# Patient Record
Sex: Female | Born: 1950 | Race: White | Hispanic: No | Marital: Married | Smoking: Never smoker
Health system: Southern US, Community
[De-identification: ages and names within clinical notes are randomized; demographics above are authoritative.]

## PROBLEM LIST (undated history)

## (undated) DIAGNOSIS — L680 Hirsutism: Secondary | ICD-10-CM

## (undated) DIAGNOSIS — E282 Polycystic ovarian syndrome: Secondary | ICD-10-CM

## (undated) DIAGNOSIS — E281 Androgen excess: Secondary | ICD-10-CM

## (undated) DIAGNOSIS — I10 Essential (primary) hypertension: Secondary | ICD-10-CM

## (undated) DIAGNOSIS — K219 Gastro-esophageal reflux disease without esophagitis: Secondary | ICD-10-CM

## (undated) DIAGNOSIS — E785 Hyperlipidemia, unspecified: Secondary | ICD-10-CM

## (undated) DIAGNOSIS — L658 Other specified nonscarring hair loss: Secondary | ICD-10-CM

## (undated) DIAGNOSIS — M8580 Other specified disorders of bone density and structure, unspecified site: Secondary | ICD-10-CM

## (undated) DIAGNOSIS — A498 Other bacterial infections of unspecified site: Secondary | ICD-10-CM

## (undated) DIAGNOSIS — M1990 Unspecified osteoarthritis, unspecified site: Secondary | ICD-10-CM

## (undated) DIAGNOSIS — E039 Hypothyroidism, unspecified: Secondary | ICD-10-CM

## (undated) DIAGNOSIS — T7840XA Allergy, unspecified, initial encounter: Secondary | ICD-10-CM

## (undated) DIAGNOSIS — E079 Disorder of thyroid, unspecified: Secondary | ICD-10-CM

## (undated) HISTORY — PX: POLYPECTOMY: SHX149

## (undated) HISTORY — DX: Other bacterial infections of unspecified site: A49.8

## (undated) HISTORY — DX: Hirsutism: L68.0

## (undated) HISTORY — DX: Hypothyroidism, unspecified: E03.9

## (undated) HISTORY — PX: WISDOM TOOTH EXTRACTION: SHX21

## (undated) HISTORY — DX: Polycystic ovarian syndrome: E28.2

## (undated) HISTORY — DX: Unspecified osteoarthritis, unspecified site: M19.90

## (undated) HISTORY — DX: Allergy, unspecified, initial encounter: T78.40XA

## (undated) HISTORY — DX: Gastro-esophageal reflux disease without esophagitis: K21.9

## (undated) HISTORY — DX: Hyperlipidemia, unspecified: E78.5

## (undated) HISTORY — PX: TONSILLECTOMY: SUR1361

## (undated) HISTORY — DX: Essential (primary) hypertension: I10

## (undated) HISTORY — DX: Other specified disorders of bone density and structure, unspecified site: M85.80

## (undated) HISTORY — DX: Other specified nonscarring hair loss: L65.8

## (undated) HISTORY — DX: Androgen excess: E28.1

---

## 2000-04-08 ENCOUNTER — Other Ambulatory Visit: Admission: RE | Admit: 2000-04-08 | Discharge: 2000-04-08 | Payer: Self-pay | Admitting: Obstetrics & Gynecology

## 2003-09-21 ENCOUNTER — Other Ambulatory Visit: Admission: RE | Admit: 2003-09-21 | Discharge: 2003-09-21 | Payer: Self-pay | Admitting: Gynecology

## 2009-10-28 HISTORY — PX: COLONOSCOPY: SHX174

## 2011-10-29 DIAGNOSIS — A498 Other bacterial infections of unspecified site: Secondary | ICD-10-CM

## 2011-10-29 HISTORY — DX: Other bacterial infections of unspecified site: A49.8

## 2012-08-17 ENCOUNTER — Other Ambulatory Visit: Payer: Self-pay | Admitting: Gastroenterology

## 2012-08-17 DIAGNOSIS — R109 Unspecified abdominal pain: Secondary | ICD-10-CM

## 2012-08-20 ENCOUNTER — Ambulatory Visit
Admission: RE | Admit: 2012-08-20 | Discharge: 2012-08-20 | Disposition: A | Discharge status: Home / Self Care | Payer: BC Managed Care – PPO | Source: Ambulatory Visit | Attending: Gastroenterology | Admitting: Gastroenterology

## 2012-08-20 DIAGNOSIS — R109 Unspecified abdominal pain: Secondary | ICD-10-CM

## 2012-08-20 MED ORDER — IOHEXOL 300 MG/ML  SOLN
100.0000 mL | Freq: Once | INTRAMUSCULAR | Status: AC | PRN
  Administered 2012-08-20: 100 mL via INTRAVENOUS

## 2012-12-08 ENCOUNTER — Encounter: Payer: Self-pay | Admitting: Family Medicine

## 2013-05-10 ENCOUNTER — Other Ambulatory Visit: Payer: Self-pay | Admitting: Obstetrics and Gynecology

## 2013-05-10 DIAGNOSIS — R7989 Other specified abnormal findings of blood chemistry: Secondary | ICD-10-CM

## 2013-05-12 LAB — HM DEXA SCAN

## 2013-05-18 ENCOUNTER — Ambulatory Visit
Admission: RE | Admit: 2013-05-18 | Discharge: 2013-05-18 | Disposition: A | Discharge status: Home / Self Care | Payer: BC Managed Care – PPO | Source: Ambulatory Visit | Attending: Obstetrics and Gynecology | Admitting: Obstetrics and Gynecology

## 2013-05-18 ENCOUNTER — Other Ambulatory Visit: Payer: BC Managed Care – PPO

## 2013-05-18 DIAGNOSIS — R7989 Other specified abnormal findings of blood chemistry: Secondary | ICD-10-CM

## 2013-05-18 MED ORDER — IOHEXOL 300 MG/ML  SOLN
100.0000 mL | Freq: Once | INTRAMUSCULAR | Status: AC | PRN
  Administered 2013-05-18: 100 mL via INTRAVENOUS

## 2013-06-10 LAB — HEMOGLOBIN A1C: Hgb A1c MFr Bld: 5.6 % (ref 4.0–6.0)

## 2013-10-28 HISTORY — PX: COLONOSCOPY: SHX174

## 2014-01-11 LAB — HEMOGLOBIN A1C: Hgb A1c MFr Bld: 5.7 % (ref 4.0–6.0)

## 2014-02-25 LAB — HEPATIC FUNCTION PANEL
ALT: 28 U/L (ref 7–35)
AST: 24 U/L (ref 13–35)

## 2014-02-25 LAB — BASIC METABOLIC PANEL
BUN: 22 mg/dL — AB (ref 4–21)
Creatinine: 1 mg/dL (ref 0.5–1.1)
Glucose: 111 mg/dL
POTASSIUM: 5.4 mmol/L — AB (ref 3.4–5.3)
SODIUM: 137 mmol/L (ref 137–147)

## 2014-02-25 LAB — TSH: TSH: 3.17 u[IU]/mL (ref 0.41–5.90)

## 2014-03-18 LAB — BASIC METABOLIC PANEL
BUN: 26 mg/dL — AB (ref 4–21)
Creatinine: 1.3 mg/dL — AB (ref 0.5–1.1)
GLUCOSE: 92 mg/dL
Potassium: 5.1 mmol/L (ref 3.4–5.3)
SODIUM: 139 mmol/L (ref 137–147)

## 2014-03-18 LAB — HEPATIC FUNCTION PANEL
ALT: 33 U/L (ref 7–35)
AST: 24 U/L (ref 13–35)
Alkaline Phosphatase: 55 U/L (ref 25–125)
BILIRUBIN, TOTAL: 0.5 mg/dL

## 2014-05-11 ENCOUNTER — Encounter: Payer: Self-pay | Admitting: *Deleted

## 2014-07-06 ENCOUNTER — Ambulatory Visit (INDEPENDENT_AMBULATORY_CARE_PROVIDER_SITE_OTHER): Payer: BC Managed Care – PPO | Admitting: Endocrinology

## 2014-07-06 ENCOUNTER — Other Ambulatory Visit: Payer: Self-pay | Admitting: Endocrinology

## 2014-07-06 ENCOUNTER — Encounter: Payer: Self-pay | Admitting: Endocrinology

## 2014-07-06 VITALS — BP 126/70 | HR 63 | Temp 98.3°F | Resp 16 | Wt 187.4 lb

## 2014-07-06 DIAGNOSIS — E039 Hypothyroidism, unspecified: Secondary | ICD-10-CM

## 2014-07-06 DIAGNOSIS — E288 Other ovarian dysfunction: Secondary | ICD-10-CM

## 2014-07-06 DIAGNOSIS — E281 Androgen excess: Secondary | ICD-10-CM

## 2014-07-06 DIAGNOSIS — E282 Polycystic ovarian syndrome: Secondary | ICD-10-CM | POA: Insufficient documentation

## 2014-07-06 DIAGNOSIS — M8580 Other specified disorders of bone density and structure, unspecified site: Secondary | ICD-10-CM | POA: Insufficient documentation

## 2014-07-06 DIAGNOSIS — I10 Essential (primary) hypertension: Secondary | ICD-10-CM | POA: Insufficient documentation

## 2014-07-06 LAB — COMPREHENSIVE METABOLIC PANEL
ALT: 32 U/L (ref 0–35)
AST: 24 U/L (ref 0–37)
Albumin: 4.4 g/dL (ref 3.5–5.2)
Alkaline Phosphatase: 53 U/L (ref 39–117)
BUN: 13 mg/dL (ref 6–23)
CO2: 27 meq/L (ref 19–32)
CREATININE: 1 mg/dL (ref 0.4–1.2)
Calcium: 10 mg/dL (ref 8.4–10.5)
Chloride: 103 mEq/L (ref 96–112)
GFR: 57.28 mL/min — AB (ref >60.00)
Glucose, Bld: 93 mg/dL (ref 70–99)
Potassium: 5.5 mEq/L — ABNORMAL HIGH (ref 3.5–5.1)
Sodium: 139 mEq/L (ref 135–145)
Total Bilirubin: 0.5 mg/dL (ref 0.2–1.2)
Total Protein: 7.5 g/dL (ref 6.0–8.3)

## 2014-07-06 LAB — TSH: TSH: 0.91 u[IU]/mL (ref 0.35–4.50)

## 2014-07-06 LAB — T4, FREE: Free T4: 0.98 ng/dL (ref 0.60–1.60)

## 2014-07-06 LAB — HEMOGLOBIN A1C: Hgb A1c MFr Bld: 6.2 % (ref 4.6–6.5)

## 2014-07-06 MED ORDER — SPIRONOLACTONE 50 MG PO TABS: 75.0000 mg | ORAL_TABLET | Freq: Every day | ORAL | Status: DC

## 2014-07-06 MED ORDER — SPIRONOLACTONE 50 MG PO TABS: 50.0000 mg | ORAL_TABLET | Freq: Every day | ORAL | Status: DC

## 2014-07-06 NOTE — Assessment & Plan Note (Signed)
Last Testosterone levels were improving on Spironolactone treatment. Encouraged diet and exercise in an effort to lose weight.  Recheck levels at this visit. Spironolactone controlling her hirsuitism well. She has tried vaniqua in the past, however it has been too expensive.

## 2014-07-06 NOTE — Progress Notes (Signed)
Pre visit review using our clinic review tool, if applicable. No additional management support is needed unless otherwise documented below in the visit note. 

## 2014-07-06 NOTE — Progress Notes (Signed)
Reason for visit: Hypothyroidism, PCOS, hyperandrogenemia  HPI  Cynthia Roberson is a 63 y.o.-year-old female,  is here for  Follow up for hypothyroidism, elevated Testosterone levels and postmenopausal PCOS. Dr Ester Rink is her dermatologist at [facility]. PCP is Dr Leighton Ruff.   Hypothyroidism- Patient recalls being diagnosed with hypothyroidism in 2002 following blood work. She takes Synthroid 50 mcg daily, and her dose has not changed recently. The patient is taking her medication as directed and has not missed any doses.  Patient has been taking this medication fasting, with water, separate from breakfast > 30 minutes and from pills ( calcium, iron, PPIs, multivitamins)> 4 hours.   I reviewed pt's thyroid tests: Lab Results  Component Value Date   TSH 3.17 02/25/2014    Review of systems: [ x  ] complains of    [  ] denies [  x] weight gain, attributes this due to her lifestyle  [  ] constipation  [  ] fatigue   [  ] dry skin  [  ] cold intolerance [  ] hair loss [  ] noticing any enlargement in size of thyroid [  ] lumps in neck [  ] dysphagia [  ] recent change in voice [  ]  SOB with lying down.  Elevated Testosterone levels, and PCO S.-  The patient has a history of scalp, hair thinning since her younger years, for which she has been using prescription strength, Rogaine with good results.She is now using it only once daily and hair thinning has slowed down.  She also has noticed increased hair growth over her face, shoulders, chest, abdomen, and change in the texture of these hairs over past several years, with recent improvement. Testosterone level checked, was elevated at 130 in 2013. The patient has been on spironolactone treatment since 2014, August and her testosterone levels have improved to the 96 range on recheck. CT abdomen was negative for any ovarian/adrenal tumors in July 2014. The patient denies any problem with acne. She is now postmenopausal since Around age  22. She had a always had a history of irregular menses and had been on birth control in the past. She had difficulty with conception and had used Clomid. She has had 2 successful pregnancies in the past. Following this, the patient had regular menses for some time until she attained menopause. The patient does not have a history of diabetes mellitus or impaired glucose tolerance. Recently hasn't been keeping up with her lifestyle efforts and gained weight.  At last check in may 2015, the patient had mild elevation in her potassium level, following which dose of spironolactone was reduced from 100 mg to 75 milligrams daily. Patient is tolerating the medication well except for occasional cough, and feels that her hirsutism is under control. She is not needing to pluck her facial hair daily.  Lab Results  Component Value Date   HGBA1C 5.7 01/11/2014   Lab Results  Component Value Date   NA 139 03/18/2014    Lab Results  Component Value Date   K 5.1 03/18/2014   Past Medical History  Diagnosis Date  . Other alopecia   . Hypertension   . Hirsutism   . Hyperandrogenemia   . Unspecified hypothyroidism   . Osteopenia   . Polycystic ovaries    Past Surgical History  Procedure Laterality Date  . Tonsillectomy    . Wisdom tooth extraction     History   Social History  . Marital Status:  Married    Spouse Name: N/A    Number of Children: N/A  . Years of Education: N/A   Occupational History  . Not on file.   Social History Main Topics  . Smoking status: Never Smoker   . Smokeless tobacco: Not on file  . Alcohol Use: Not on file  . Drug Use: Not on file  . Sexual Activity: Not on file   Other Topics Concern  . Not on file   Social History Narrative  . No narrative on file   Current Outpatient Prescriptions on File Prior to Visit  Medication Sig Dispense Refill  . acetaminophen (TYLENOL) 500 MG tablet Take 500 mg by mouth every 6 (six) hours as needed.      . Ascorbic Acid  (VITAMIN C) 1000 MG tablet Take 1,000 mg by mouth daily.      . Biotin 5 MG TABS Take by mouth.      . Cholecalciferol (VITAMIN D) 2000 UNITS tablet Take 2,000 Units by mouth daily.      . Coenzyme Q10 (COQ10) 100 MG CAPS Take 1 capsule by mouth daily.      . Flaxseed, Linseed, (FLAXSEED OIL) 1200 MG CAPS Take 1 capsule by mouth daily.      [name] levothyroxine (SYNTHROID, LEVOTHROID) 50 MCG tablet Take 50 mcg by mouth daily before breakfast.      . loratadine (CLARITIN) 10 MG tablet Take 10 mg by mouth daily.      . Magnesium 250 MG TABS Take 1 tablet by mouth daily.      . Minoxidil (ROGAINE MENS) 5 % FOAM Apply topically.      . nebivolol (BYSTOLIC) 5 MG tablet Take 5 mg by mouth daily.      . ranitidine (ZANTAC) 150 MG tablet Take 150 mg by mouth 2 (two) times daily.      [name] spironolactone (ALDACTONE) 100 MG tablet Take 100 mg by mouth daily.      . vitamin B-12 (CYANOCOBALAMIN) 500 MCG tablet Take 500 mcg by mouth daily.      . vitamin E 400 UNIT capsule Take 400 Units by mouth daily.      . Zinc 50 MG TABS Take 1 tablet by mouth daily.       No current facility-administered medications on file prior to visit.   Allergies  Allergen Reactions  . Amoxicillin   . Sulfa Antibiotics    Family History  Problem Relation Age of Onset  . Heart disease Mother   . Heart disease Father   . Stroke Father   . 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    Review of Systems: [x]  complains of  [  ] denies General:   [  ] Recent weight change [  ] Fatigue  [  ] Loss of appetite Eyes: [  ]  Vision Difficulty [  ]  Eye pain ENT: [  ]  Hearing difficulty [  ]  Difficulty Swallowing CVS: [  ] Chest pain [  ]  Palpitations/Irregular Heart beat [  ]  Shortness of breath lying flat [  ] Swelling of legs Resp: [  ] Frequent Cough [  ] Shortness of Breath  [  ]  Wheezing GI: [  ] Heartburn  [  ] Nausea or Vomiting  [  ] Diarrhea [  ]  Constipation  [  ] Abdominal Pain  GU: [  ]  Polyuria  [  ]  nocturia Bones/joints:  [  ]  Muscle aches  [  ] Joint Pain  [  ] Bone pain Skin/Hair/Nails: [  ]  Rash  [  ] New stretch marks [  ]  Itching [x  ] Hair loss [ x ]  Excessive hair growth Reproduction: [  ] Low sexual desire , [  ]  Women: Menstrual cycle problems [  ]  Women: Breast Discharge [  ] Men: Difficulty with erections [  ]  Men: Enlarged Breasts CNS: [  ] Frequent Headaches [  ] Blurry vision [  ] Tremors [  ] Seizures [  ] Loss of consciousness [  ] Localized weakness Endocrine: [  ]  Excess thirst [  ]  Feeling excessively hot [  ]  Feeling excessively cold Heme: [  ]  Easy bruising [  ]  Enlarged glands or lumps in neck Allergy: [  ]  Food allergies [  ] Environmental allergies  PE: BP 126/70  Pulse 63  Temp(Src) 98.3 F (36.8 C) (Oral)  Resp 16  Wt 187 lb 6.4 oz (85.004 kg)  SpO2 96% Wt Readings from Last 3 Encounters:  07/06/14 187 lb 6.4 oz (85.004 kg)    HEENT: [place]/AT, EOMI, no icterus, no proptosis, no chemosis, no mild lid lag, no retraction, eyes close completely Neck: thyroid gland - smooth, non-tender, no erythema, no tracheal deviation; negative Pemberton's sign; no lympahadenopathy; no bruits, mild hirsuitism over face Lungs: good air entry, clear bilaterally Heart: S1&S2 normal, regular rate & rhythm; no murmurs, rubs or gallops Abd: soft, NT, ND, no HSM, +BS Ext: no tremor in hands bilaterally, no edema, 2+ DP/PT pulses, good muscle mass Neuro: normal gait, 2+ reflexes bilaterally, normal 5/5 strength, no proximal myopathy  Derm: no pretibial myxoedema/skin dryness   ASSESSMENT: 1. Hypothyroidism 2. Hyperandrogenemia 3. PCOS  PLAN:  Problem List Items Addressed This Visit     Endocrine   PCOS (polycystic ovarian syndrome) - Primary     Likely diagnosis based on her prior hx irregular menses, androgenic alopecia and elevated Testosterone levels.  Check A1c given recent weight gain to assess  whether she has preDM- if so, she could be considered as a candidate for Metformin.  Encouraged diet and exercise in an effort to lose weight.   RTC 6 months    Relevant Orders      Comprehensive metabolic panel      Hemoglobin A1c      Testosterone, free, total   Unspecified hypothyroidism     Clinically euthyroid on exam today without evidence of goiter. Check thyroid levels and adjust dose of Synthroid if needed. Is on Brand name Synthroid.     Relevant Orders      TSH      T4, free      Testosterone, free, total     Other   Hyperandrogenemia     Last Testosterone levels were improving on Spironolactone treatment. Encouraged diet and exercise in an effort to lose weight.  Recheck levels at this visit. Spironolactone controlling her hirsuitism well. She has tried vaniqua in the past, however it has been too expensive.       -RTC in 6 months  Sola Margolis [facility]  07/06/2014   1:26 PM

## 2014-07-06 NOTE — Assessment & Plan Note (Signed)
Likely diagnosis based on her prior hx irregular menses, androgenic alopecia and elevated Testosterone levels.  Check A1c given recent weight gain to assess whether she has preDM- if so, she could be considered as a candidate for Metformin.  Encouraged diet and exercise in an effort to lose weight.   RTC 6 months

## 2014-07-06 NOTE — Assessment & Plan Note (Signed)
Clinically euthyroid on exam today without evidence of goiter. Check thyroid levels and adjust dose of Synthroid if needed. Is on Brand name Synthroid.

## 2014-07-06 NOTE — Patient Instructions (Signed)
Continue current spirinolactone . Assess Testosterone levels today. CMP.  Check A1c. If trending up, then will consider adding metformin. Work on diet and exercise efforts.   Continue current Synthroid. Update thyroid tests today.  Please come back for a follow-up appointment in 6 months

## 2014-07-07 ENCOUNTER — Encounter: Payer: Self-pay | Admitting: *Deleted

## 2014-07-07 ENCOUNTER — Other Ambulatory Visit: Payer: Self-pay | Admitting: Endocrinology

## 2014-07-07 DIAGNOSIS — E281 Androgen excess: Secondary | ICD-10-CM

## 2014-07-07 DIAGNOSIS — E282 Polycystic ovarian syndrome: Secondary | ICD-10-CM

## 2014-07-07 LAB — TESTOSTERONE, FREE, TOTAL, SHBG
Sex Hormone Binding: 31 nmol/L (ref 18–114)
TESTOSTERONE-% FREE: 2 % (ref 0.4–2.4)
Testosterone, Free: 33.8 pg/mL — ABNORMAL HIGH (ref 0.6–6.8)
Testosterone: 173 ng/dL — ABNORMAL HIGH (ref 10–70)

## 2014-07-07 LAB — LUTEINIZING HORMONE: LH: 20.3 m[IU]/mL

## 2014-07-07 LAB — FOLLICLE STIMULATING HORMONE: FSH: 26.1 m[IU]/mL

## 2014-07-11 NOTE — Telephone Encounter (Signed)
Called and advised patient of results

## 2014-07-11 NOTE — Telephone Encounter (Signed)
Spoke with pt, notified of results. Would prefer to have ultrasound done in [place] where she lives, [facility] if possible.

## 2014-07-12 ENCOUNTER — Other Ambulatory Visit: Payer: Self-pay | Admitting: Endocrinology

## 2014-07-12 DIAGNOSIS — E282 Polycystic ovarian syndrome: Secondary | ICD-10-CM

## 2014-07-14 ENCOUNTER — Other Ambulatory Visit (INDEPENDENT_AMBULATORY_CARE_PROVIDER_SITE_OTHER): Payer: BC Managed Care – PPO

## 2014-07-14 ENCOUNTER — Encounter: Payer: Self-pay | Admitting: *Deleted

## 2014-07-14 DIAGNOSIS — E281 Androgen excess: Secondary | ICD-10-CM

## 2014-07-14 DIAGNOSIS — E282 Polycystic ovarian syndrome: Secondary | ICD-10-CM

## 2014-07-14 LAB — BASIC METABOLIC PANEL
BUN: 19 mg/dL (ref 6–23)
CALCIUM: 9.2 mg/dL (ref 8.4–10.5)
CO2: 26 mEq/L (ref 19–32)
Chloride: 103 mEq/L (ref 96–112)
Creatinine, Ser: 1 mg/dL (ref 0.4–1.2)
GFR: 60.66 mL/min (ref >60.00)
Glucose, Bld: 120 mg/dL — ABNORMAL HIGH (ref 70–99)
POTASSIUM: 4 meq/L (ref 3.5–5.1)
Sodium: 137 mEq/L (ref 135–145)

## 2014-07-15 ENCOUNTER — Encounter: Payer: Self-pay | Admitting: *Deleted

## 2014-07-15 LAB — DHEA-SULFATE: DHEA-SO4: 77 ug/dL (ref 29.4–220.5)

## 2014-07-15 LAB — INSULIN-LIKE GROWTH FACTOR: SOMATOMEDIN (IGF-I): 165 ng/mL (ref 31–179)

## 2014-07-18 ENCOUNTER — Encounter: Payer: Self-pay | Admitting: Endocrinology

## 2014-07-18 ENCOUNTER — Encounter: Payer: Self-pay | Admitting: *Deleted

## 2014-07-18 ENCOUNTER — Other Ambulatory Visit: Payer: BC Managed Care – PPO

## 2014-07-20 ENCOUNTER — Encounter: Payer: Self-pay | Admitting: *Deleted

## 2014-07-20 ENCOUNTER — Ambulatory Visit
Admission: RE | Admit: 2014-07-20 | Discharge: 2014-07-20 | Disposition: A | Discharge status: Home / Self Care | Payer: BC Managed Care – PPO | Source: Ambulatory Visit | Attending: Endocrinology | Admitting: Endocrinology

## 2014-07-20 DIAGNOSIS — E282 Polycystic ovarian syndrome: Secondary | ICD-10-CM

## 2014-07-20 DIAGNOSIS — E281 Androgen excess: Secondary | ICD-10-CM

## 2014-07-21 ENCOUNTER — Other Ambulatory Visit: Payer: Self-pay | Admitting: Family Medicine

## 2014-07-21 DIAGNOSIS — R29818 Other symptoms and signs involving the nervous system: Secondary | ICD-10-CM

## 2014-08-12 ENCOUNTER — Other Ambulatory Visit: Payer: Self-pay

## 2014-08-24 ENCOUNTER — Other Ambulatory Visit: Payer: BC Managed Care – PPO

## 2014-09-28 DIAGNOSIS — G5602 Carpal tunnel syndrome, left upper limb: Secondary | ICD-10-CM | POA: Insufficient documentation

## 2014-09-28 DIAGNOSIS — G43109 Migraine with aura, not intractable, without status migrainosus: Secondary | ICD-10-CM | POA: Insufficient documentation

## 2014-10-03 LAB — HM MAMMOGRAPHY

## 2014-10-11 ENCOUNTER — Ambulatory Visit (INDEPENDENT_AMBULATORY_CARE_PROVIDER_SITE_OTHER): Payer: BC Managed Care – PPO | Admitting: Endocrinology

## 2014-10-11 ENCOUNTER — Encounter: Payer: Self-pay | Admitting: Endocrinology

## 2014-10-11 VITALS — BP 118/72 | HR 79 | Ht 62.0 in | Wt 192.5 lb

## 2014-10-11 DIAGNOSIS — E039 Hypothyroidism, unspecified: Secondary | ICD-10-CM

## 2014-10-11 DIAGNOSIS — E281 Androgen excess: Secondary | ICD-10-CM

## 2014-10-11 DIAGNOSIS — E282 Polycystic ovarian syndrome: Secondary | ICD-10-CM

## 2014-10-11 LAB — COMPREHENSIVE METABOLIC PANEL
ALT: 30 U/L (ref 0–35)
AST: 25 U/L (ref 0–37)
Albumin: 4.4 g/dL (ref 3.5–5.2)
Alkaline Phosphatase: 54 U/L (ref 39–117)
BILIRUBIN TOTAL: 0.6 mg/dL (ref 0.2–1.2)
BUN: 21 mg/dL (ref 6–23)
CO2: 24 meq/L (ref 19–32)
CREATININE: 1 mg/dL (ref 0.4–1.2)
Calcium: 9.8 mg/dL (ref 8.4–10.5)
Chloride: 104 mEq/L (ref 96–112)
GFR: 61.34 mL/min (ref >60.00)
Glucose, Bld: 104 mg/dL — ABNORMAL HIGH (ref 70–99)
Potassium: 5.4 mEq/L — ABNORMAL HIGH (ref 3.5–5.1)
Sodium: 136 mEq/L (ref 135–145)
Total Protein: 7 g/dL (ref 6.0–8.3)

## 2014-10-11 LAB — HEMOGLOBIN A1C: Hgb A1c MFr Bld: 6.3 % (ref 4.6–6.5)

## 2014-10-11 LAB — T4, FREE: FREE T4: 1.03 ng/dL (ref 0.60–1.60)

## 2014-10-11 LAB — TSH: TSH: 2.02 u[IU]/mL (ref 0.35–4.50)

## 2014-10-11 NOTE — Assessment & Plan Note (Signed)
Last Testosterone levels were worsening after dose reduction in Spironolactone treatment. Encouraged diet and exercise in an effort to lose weight.  Recheck levels at this visit. Spironolactone controlling her hirsuitism well. She has tried vaniqua in the past, however it has been too expensive. We also discussed possibly adding HCTZ at future visits to the [facility] to control the associated side effect of hyperkalemia versus move onto specific anti-androgen therapy versus consider ovarian surgery if the Testosterone levels continue to rise.  She declined ovarian Surgery and understands the risk of missing a small androgen producing tumor even with a normal imaging study. It is reassuring that the Testosterone levels are not rapidly rising.  She will continue current Spirinoloactone for now, as hirsutism is stable.   RTC 3 months

## 2014-10-11 NOTE — Progress Notes (Signed)
Patient ID: Cynthia Roberson, female   DOB: [birth year], 63 y.o.   MRN: [id number]    Reason for visit: Hypothyroidism, PCOS, hyperandrogenemia  HPI  Cynthia Roberson is a 63 y.o.-year-old female,  is here for  Follow up for hypothyroidism, elevated Testosterone levels and postmenopausal PCOS. Dr Ester Rink is her dermatologist at [facility]. PCP is Dr Leighton Ruff. 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 She takes Synthroid 50 mcg daily, and her dose has not changed recently. The patient is taking her medication as directed and has not missed any doses.  Patient has been taking this medication fasting, with water, separate from breakfast > 30 minutes and from pills ( calcium, iron, PPIs, multivitamins)> 4 hours.   I reviewed pt's thyroid tests: Lab Results  Component Value Date   TSH 0.91 07/06/2014   TSH 3.17 02/25/2014    Review of systems: [ x  ] complains of    [  ] denies [  x] weight gain, attributes this due to her lifestyle  [  ] constipation  [ x ] fatigue  Lately due to stress and extra familial responsibilities [ x ] dry skin-chronic  [  ] cold intolerance [  ] hair loss [  ] noticing any enlargement in size of thyroid [  ] lumps in neck [  ] dysphagia [  ] recent change in voice [  ]  SOB with lying down.  Elevated Testosterone levels, and PCO S.-  The patient has a history of scalp, hair thinning since her younger years, for which she has been using prescription strength, Rogaine with good results.She is now using it only once daily and hair thinning has slowed down.  She also has noticed increased hair growth over her face, shoulders, chest, abdomen, and change in the texture of these hairs over past several years, with recent improvement. Testosterone level checked, was elevated at 130 in 2013. The patient has been on spironolactone  treatment since 2014, August and her testosterone levels had improved to the 96 range on recheck. CT abdomen was negative for any ovarian/adrenal tumors in July 2014. The patient denies any problem with acne. She is now postmenopausal since Around age 63. She had a always had a history of irregular menses and had been on birth control in the past. She had difficulty with conception and had used Clomid. She has had 2 successful pregnancies in the past. Following this, the patient had regular menses for some time until she attained menopause. The patient does not have a history of diabetes mellitus or impaired glucose tolerance. Recently hasn't been keeping up with her lifestyle efforts and gained weight.  At last check in 2015, the patient had mild elevation in her potassium level, following which dose of spironolactone was reduced from 75 mg to 50 milligrams daily. Patient is tolerating the medication well except for occasional cough, and feels that her hirsutism is under control. She is not needing to pluck her facial hair daily. Testosterone levels had increased last visit, and ovarian US was negative Sept 2015.   Lab Results  Component Value Date   HGBA1C 6.2 07/06/2014   Lab Results  Component Value Date   NA 137 07/14/2014    Lab Results  Component Value Date   K 4.0 07/14/2014    Testosterone levels Sept 2015 was 173  Past Medical History  Diagnosis  Date  . Other alopecia   . Hypertension   . Hirsutism   . Hyperandrogenemia   . Unspecified hypothyroidism   . Osteopenia   . Polycystic ovaries    Past Surgical History  Procedure Laterality Date  . Tonsillectomy    . Wisdom tooth extraction     History   Social History  . Marital Status: Married    Spouse Name: N/A    Number of Children: N/A  . Years of Education: N/A   Occupational History  . Not on file.   Social History Main Topics  . Smoking status: Never Smoker   . Smokeless tobacco: Not on file  . Alcohol  Use: Not on file  . Drug Use: Not on file  . Sexual Activity: Not on file   Other Topics Concern  . Not on file   Social History Narrative   Current Outpatient Prescriptions on File Prior to Visit  Medication Sig Dispense Refill  . acetaminophen (TYLENOL) 500 MG tablet Take 500 mg by mouth every 6 (six) hours as needed.    . Ascorbic Acid (VITAMIN C) 1000 MG tablet Take 1,000 mg by mouth daily.    . Biotin 5 MG TABS Take by mouth.    . Cholecalciferol (VITAMIN D) 2000 UNITS tablet Take 2,000 Units by mouth daily.    . Coenzyme Q10 (COQ10) 100 MG CAPS Take 1 capsule by mouth daily.    . Flaxseed, Linseed, (FLAXSEED OIL) 1200 MG CAPS Take 1 capsule by mouth daily.    [name] levothyroxine (SYNTHROID, LEVOTHROID) 50 MCG tablet Take 50 mcg by mouth daily before breakfast.    . loratadine (CLARITIN) 10 MG tablet Take 10 mg by mouth daily.    . Magnesium 250 MG TABS Take 1 tablet by mouth daily.    . Minoxidil (ROGAINE MENS) 5 % FOAM Apply topically.    . nebivolol (BYSTOLIC) 5 MG tablet Take 5 mg by mouth daily.    . ranitidine (ZANTAC) 150 MG tablet Take 150 mg by mouth 2 (two) times daily.    [name] spironolactone (ALDACTONE) 50 MG tablet Take 1 tablet (50 mg total) by mouth daily. 30 tablet 6  . vitamin B-12 (CYANOCOBALAMIN) 500 MCG tablet Take 500 mcg by mouth daily.    . vitamin E 400 UNIT capsule Take 400 Units by mouth daily.    . Zinc 50 MG TABS Take 1 tablet by mouth daily.     No current facility-administered medications on file prior to visit.   Allergies  Allergen Reactions  . Amoxicillin   . Sulfa Antibiotics    Family History  Problem Relation Age of Onset  . Heart disease Mother   . Heart disease Father   . Stroke Father   . 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   Review of Systems- [ x ]  Complains of    [  ]  denies [ x ] Recent weight change [ x ]  Fatigue  [  ] chest pain [  ] shortness of breath [   ] leg swelling [  ] cough [  ] nausea/vomiting [  ] diarrhea [  ] constipation [  ] abdominal pain    PE: BP 118/72 mmHg  Pulse 79  Ht 5\' 2"  (1.575 m)  Wt 192 lb 8 oz (87.317 kg)  BMI 35.20 kg/m2  SpO2 95% Wt Readings from Last  3 Encounters:  10/11/14 192 lb 8 oz (87.317 kg)  07/06/14 187 lb 6.4 oz (85.004 kg)    HEENT: [place]/AT, EOMI, no icterus, no proptosis, no chemosis, no mild lid lag, no retraction, eyes close completely Neck: thyroid gland - smooth, non-tender, no erythema, no tracheal deviation; negative Pemberton's sign; no lympahadenopathy; no bruits, mild hirsuitism over face Lungs: good air entry, clear bilaterally Heart: S1&S2 normal, regular rate & rhythm; no murmurs, rubs or gallops Ext: no tremor in hands bilaterally, no edema, 2+ DP/PT pulses, good muscle mass Neuro: normal gait, 2+ reflexes bilaterally, normal 5/5 strength, no proximal myopathy  Derm: no pretibial myxoedema/skin dryness   ASSESSMENT: 1. Hypothyroidism 2. Hyperandrogenemia 3. PCOS  PLAN:  Problem List Items Addressed This Visit      Endocrine   PCOS (polycystic ovarian syndrome)    Likely diagnosis based on her prior hx irregular menses, androgenic alopecia and elevated Testosterone levels.  We discussed that diagnostic criteria in her age group are not as well defined.  Recent ovarian US was normal and didn't show any ovarian tumors.   Check A1c given recent weight gain to assess whether she has progression of her preDM- if so, she could be considered as a candidate for Metformin.  Encouraged diet and exercise in an effort to lose weight. Discussed that once she starts with her lifestyle efforts- could add weight loss pill if interested, however she declined and will pursue diet/exercise.   RTC 3 months      Relevant Orders      Comprehensive metabolic panel      Hemoglobin A1c      Testosterone,Free and Total   Hypothyroidism - Primary    Clinically euthyroid on exam today without  evidence of goiter. Check thyroid levels and adjust dose of Synthroid if needed. Is on Brand name Synthroid.       Relevant Orders      TSH      T4, free     Other   Hyperandrogenemia    Last Testosterone levels were worsening after dose reduction in Spironolactone treatment. Encouraged diet and exercise in an effort to lose weight.  Recheck levels at this visit. Spironolactone controlling her hirsuitism well. She has tried vaniqua in the past, however it has been too expensive. We also discussed possibly adding HCTZ at future visits to the [facility] to control the associated side effect of hyperkalemia versus move onto specific anti-androgen therapy versus consider ovarian surgery if the Testosterone levels continue to rise.  She declined ovarian Surgery and understands the risk of missing a small androgen producing tumor even with a normal imaging study. It is reassuring that the Testosterone levels are not rapidly rising.  She will continue current Spirinoloactone for now, as hirsutism is stable.   RTC 3 months      Relevant Orders      Comprehensive metabolic panel      -RTC in 6 months  Donatello Kleve [facility]  10/11/2014   10:23 AM

## 2014-10-11 NOTE — Assessment & Plan Note (Signed)
Likely diagnosis based on her prior hx irregular menses, androgenic alopecia and elevated Testosterone levels.  We discussed that diagnostic criteria in her age group are not as well defined.  Recent ovarian US was normal and didn't show any ovarian tumors.   Check A1c given recent weight gain to assess whether she has progression of her preDM- if so, she could be considered as a candidate for Metformin.  Encouraged diet and exercise in an effort to lose weight. Discussed that once she starts with her lifestyle efforts- could add weight loss pill if interested, however she declined and will pursue diet/exercise.   RTC 3 months

## 2014-10-11 NOTE — Patient Instructions (Signed)
Work on diet and exercise.   Metformin if A1c is higher this time.   Labs today.

## 2014-10-11 NOTE — Assessment & Plan Note (Signed)
Clinically euthyroid on exam today without evidence of goiter. Check thyroid levels and adjust dose of Synthroid if needed. Is on Brand name Synthroid.

## 2014-10-16 LAB — TESTOSTERONE,FREE AND TOTAL
Testosterone, Free: 6.1 pg/mL — ABNORMAL HIGH (ref 0.0–4.2)
Testosterone: 97 ng/dL — ABNORMAL HIGH (ref 3–41)

## 2014-10-23 ENCOUNTER — Other Ambulatory Visit: Payer: Self-pay | Admitting: Endocrinology

## 2014-10-23 DIAGNOSIS — E282 Polycystic ovarian syndrome: Secondary | ICD-10-CM

## 2014-10-23 DIAGNOSIS — E281 Androgen excess: Secondary | ICD-10-CM

## 2014-10-23 MED ORDER — SPIRONOLACTONE 25 MG PO TABS: 25.0000 mg | ORAL_TABLET | Freq: Every day | ORAL | Status: DC

## 2014-11-04 ENCOUNTER — Other Ambulatory Visit (INDEPENDENT_AMBULATORY_CARE_PROVIDER_SITE_OTHER): Payer: BC Managed Care – PPO

## 2014-11-04 DIAGNOSIS — E281 Androgen excess: Secondary | ICD-10-CM

## 2014-11-04 DIAGNOSIS — E282 Polycystic ovarian syndrome: Secondary | ICD-10-CM

## 2014-11-04 LAB — BASIC METABOLIC PANEL
BUN: 15 mg/dL (ref 6–23)
CALCIUM: 9.4 mg/dL (ref 8.4–10.5)
CO2: 28 meq/L (ref 19–32)
Chloride: 104 mEq/L (ref 96–112)
Creatinine, Ser: 0.9 mg/dL (ref 0.4–1.2)
GFR: 65.19 mL/min (ref >60.00)
GLUCOSE: 95 mg/dL (ref 70–99)
POTASSIUM: 4.6 meq/L (ref 3.5–5.1)
Sodium: 140 mEq/L (ref 135–145)

## 2015-01-04 ENCOUNTER — Ambulatory Visit (INDEPENDENT_AMBULATORY_CARE_PROVIDER_SITE_OTHER): Payer: BC Managed Care – PPO | Admitting: Endocrinology

## 2015-01-04 VITALS — BP 122/74 | HR 70 | Resp 16 | Ht 62.0 in | Wt 193.8 lb

## 2015-01-04 DIAGNOSIS — E282 Polycystic ovarian syndrome: Secondary | ICD-10-CM

## 2015-01-04 DIAGNOSIS — E281 Androgen excess: Secondary | ICD-10-CM

## 2015-01-04 DIAGNOSIS — E039 Hypothyroidism, unspecified: Secondary | ICD-10-CM

## 2015-01-04 NOTE — Progress Notes (Signed)
Pre visit review using our clinic review tool, if applicable. No additional management support is needed unless otherwise documented below in the visit note. 

## 2015-01-04 NOTE — Assessment & Plan Note (Addendum)
Last Testosterone levels were improving together , however patient has noticed mild prominence of abnormal hair after last dose reduction in Spironolactone.  Encouraged diet and exercise in an effort to lose weight.  Recheck K  levels with next set of labs -to be done at time of PCP appt. Spironolactone controlling her hirsuitism relatively well. She has tried vaniqua in the past, however it has been too expensive. We also discussed possibly adding HCTZ at future visits to the [facility] to control the associated side effect of hyperkalemia versus move onto specific anti-androgen therapy versus consider ovarian surgery if the Testosterone levels continue to rise.  She declined ovarian Surgery and understands the risk of missing a small androgen producing tumor even with a normal imaging study. It is reassuring that the Testosterone levels are not rapidly rising.  She will continue current Spirinoloactone for now, as hirsutism is about the same-slight worsening. Further treatment based on next set of labs and A1c

## 2015-01-04 NOTE — Progress Notes (Signed)
Reason for visit: Hypothyroidism, PCOS, hyperandrogenemia  HPI  Cynthia Roberson is a 64 y.o.-year-old female,  is here for  Follow up for hypothyroidism, elevated Testosterone levels and postmenopausal PCOS. Dr Ester Rink is her dermatologist at [facility]. PCP is Dr Clinton Gallant transferring to [facility] and has appt next month. 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 She takes Synthroid 50 mcg daily, and her dose has not changed recently. The patient is taking her medication as directed and has not missed any doses.  Patient has been taking this medication fasting, with water, separate from breakfast > 30 minutes and from pills ( calcium, iron, PPIs, multivitamins)> 4 hours.   I reviewed pt's thyroid tests: Lab Results  Component Value Date   TSH 2.02 10/11/2014   TSH 0.91 07/06/2014   TSH 3.17 02/25/2014    Review of systems: [ x  ] complains of    [  ] denies [  x] weight gain, attributes this due to her lifestyle  [  ] constipation  [ x ] fatigue  Lately due to stress and extra familial responsibilities [ x ] dry skin-chronic  [  ] cold intolerance [  ] hair loss [  ] noticing any enlargement in size of thyroid [  ] lumps in neck [  ] dysphagia [  ] recent change in voice [  ]  SOB with lying down.  Elevated Testosterone levels, and PCO S.-  The patient has a history of scalp, hair thinning since her younger years, for which she has been using prescription strength, Rogaine with good results.She is now using it only once daily and hair thinning has slowed down.  She also has noticed increased hair growth over her face, shoulders, chest, abdomen, and change in the texture of these hairs over past several years, with recent improvement. Testosterone level checked, was elevated at 130 in 2013. The patient has been on spironolactone treatment  since 2014, August and her testosterone levels had improved to the 96 range on recheck. CT abdomen was negative for any ovarian/adrenal tumors in July 2014. The patient denies any problem with acne. She is now postmenopausal since Around age 20. She had a always had a history of irregular menses and had been on birth control in the past. She had difficulty with conception and had used Clomid. She has had 2 successful pregnancies in the past. Following this, the patient had regular menses for some time until she attained menopause. The patient does not have a history of diabetes mellitus or impaired glucose tolerance. Recently hasn't been keeping up with her lifestyle efforts and gained weight.  At last check in 2015, the patient had mild elevation in her potassium level, following which dose of spironolactone was reduced from 50 mg to 25 milligrams daily. Patient is tolerating the medication well except for occasional cough, and feels that her hirsutism is now getting out of control- has been noticing mild prominence of hair on her arms. She is not needing to pluck her facial hair daily. Testosterone levels had decreased last visit, and ovarian US was negative Sept 2015.   Lab Results  Component Value Date   HGBA1C 6.3 10/11/2014   Lab Results  Component Value Date   NA 140 11/04/2014    Lab Results  Component Value Date   K 4.6 11/04/2014    Testosterone levels Sept  2015 was 173, Dec 2015 was 51  Past Medical History  Diagnosis Date  . Other alopecia   . Hypertension   . Hirsutism   . Hyperandrogenemia   . Unspecified hypothyroidism   . Osteopenia   . Polycystic ovaries    Past Surgical History  Procedure Laterality Date  . Tonsillectomy    . Wisdom tooth extraction     History   Social History  . Marital Status: Married    Spouse Name: N/A  . Number of Children: N/A  . Years of Education: N/A   Occupational History  . Not on file.   Social History Main Topics  .  Smoking status: Never Smoker   . Smokeless tobacco: Not on file  . Alcohol Use: Not on file  . Drug Use: Not on file  . Sexual Activity: Not on file   Other Topics Concern  . Not on file   Social History Narrative   Current Outpatient Prescriptions on File Prior to Visit  Medication Sig Dispense Refill  . acetaminophen (TYLENOL) 500 MG tablet Take 500 mg by mouth every 6 (six) hours as needed.    . Ascorbic Acid (VITAMIN C) 1000 MG tablet Take 1,000 mg by mouth daily.    . Biotin 5 MG TABS Take by mouth.    . Cholecalciferol (VITAMIN D) 2000 UNITS tablet Take 2,000 Units by mouth daily.    . Coenzyme Q10 (COQ10) 100 MG CAPS Take 1 capsule by mouth daily.    . Flaxseed, Linseed, (FLAXSEED OIL) 1200 MG CAPS Take 1 capsule by mouth daily.    [name] levothyroxine (SYNTHROID, LEVOTHROID) 50 MCG tablet Take 50 mcg by mouth daily before breakfast.    . loratadine (CLARITIN) 10 MG tablet Take 10 mg by mouth daily.    . Magnesium 250 MG TABS Take 1 tablet by mouth daily.    . Minoxidil (ROGAINE MENS) 5 % FOAM Apply topically.    . nebivolol (BYSTOLIC) 5 MG tablet Take 5 mg by mouth daily.    . ranitidine (ZANTAC) 150 MG tablet Take 150 mg by mouth 2 (two) times daily.    [name] spironolactone (ALDACTONE) 25 MG tablet Take 1 tablet (25 mg total) by mouth daily. 30 tablet 4  . vitamin B-12 (CYANOCOBALAMIN) 500 MCG tablet Take 500 mcg by mouth daily.    . vitamin E 400 UNIT capsule Take 400 Units by mouth daily.    . Zinc 50 MG TABS Take 1 tablet by mouth daily.     No current facility-administered medications on file prior to visit.   Allergies  Allergen Reactions  . Amoxicillin   . Sulfa Antibiotics    Family History  Problem Relation Age of Onset  . Heart disease Mother   . Heart disease Father   . Stroke Father   . 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 Also, having occasional difficulty in compliance to this med, on on those days doesn't feel any worse. Wonders if she could try being off Bystolic and assessing symptoms.   Also, recently has been taking calcium and Vitamin D daily. Takes around 6500 units Vitamin D recently.    PE: BP 122/74 mmHg  Pulse 70  Resp 16  Ht 5\' 2"  (1.575 m)  Wt 193 lb 12 oz (87.884 kg)  BMI 35.43 kg/m2  SpO2 97% Wt Readings from Last 3 Encounters:  01/04/15 193 lb 12 oz (87.884 kg)  10/11/14 192 lb 8 oz (87.317 kg)  07/06/14 187 lb 6.4 oz (85.004 kg)    HEENT: [place]/AT, EOMI, no icterus, no proptosis, no chemosis, no mild lid lag, no retraction, eyes close completely Neck: thyroid gland - smooth, non-tender, no erythema, no tracheal deviation; negative Pemberton's sign; no lympahadenopathy; no bruits, mild hirsuitism over face Lungs: good air entry, clear bilaterally Heart: S1&S2 normal, regular rate & rhythm; no murmurs, rubs or gallops Ext: no tremor in hands bilaterally, no edema, 2+ DP/PT pulses, good muscle mass Neuro: normal gait, 2+ reflexes bilaterally, normal 5/5 strength, no proximal myopathy  Derm: no pretibial myxoedema/skin dryness   ASSESSMENT: 1. Hypothyroidism 2. Hyperandrogenemia 3. PCOS  PLAN:  Problem List Items Addressed This Visit      Endocrine   PCOS (polycystic ovarian syndrome) - Primary    Likely diagnosis based on her prior hx irregular menses, androgenic alopecia and elevated Testosterone levels.  We discussed that diagnostic criteria in her age group are not as well defined at prior visits.   Recent ovarian US was normal and didn't show any ovarian tumors.   Check A1c with next set of labs given recent weight gain to assess whether she has progression of her preDM- if so, she could be considered as a candidate for Metformin. She is agreeable.  Encouraged diet and exercise in an effort to lose weight.           Relevant Orders    Hemoglobin B7S   Basic metabolic panel   Vit D  25 hydroxy (rtn osteoporosis monitoring)   Hypothyroidism    Clinically euthyroid on exam today without evidence of goiter. Recent thyroid levels are at target. Is on Brand name Synthroid.           Relevant Orders   Hemoglobin E8B   Basic metabolic panel   Vit D  25 hydroxy (rtn osteoporosis monitoring)     Other   Hyperandrogenemia    Last Testosterone levels were improving together , however patient has noticed mild prominence of abnormal hair after last dose reduction in Spironolactone.  Encouraged diet and exercise in an effort to lose weight.  Recheck K  levels with next set of labs -to be done at time of PCP appt. Spironolactone controlling her hirsuitism relatively well. She has tried vaniqua in the past, however it has been too expensive. We also discussed possibly adding HCTZ at future visits to the [place] to control the associated side effect of hyperkalemia versus move onto specific anti-androgen therapy versus consider ovarian surgery if the Testosterone levels continue to rise.  She declined ovarian Surgery and understands the risk of missing a small androgen producing tumor even with a normal imaging study. It is reassuring that the Testosterone levels are not rapidly rising.  She will continue current Spirinoloactone for now, as hirsutism is about the same-slight worsening. Further treatment based on next set of labs and A1c          Relevant Orders   Hemoglobin T5V   Basic metabolic panel   Vit D  25 hydroxy (rtn osteoporosis monitoring)      Check Vitamin D with next set of labs. Asked her to cut back Vitamin D to 4000 units daily from 6500 units daily.   -RTC in 4 months  Mattea Seger [facility]  01/04/2015   12:27 PM

## 2015-01-04 NOTE — Assessment & Plan Note (Addendum)
Likely diagnosis based on her prior hx irregular menses, androgenic alopecia and elevated Testosterone levels.  We discussed that diagnostic criteria in her age group are not as well defined at prior visits.   Recent ovarian US was normal and didn't show any ovarian tumors.   Check A1c with next set of labs given recent weight gain to assess whether she has progression of her preDM- if so, she could be considered as a candidate for Metformin. She is agreeable.  Encouraged diet and exercise in an effort to lose weight.

## 2015-01-04 NOTE — Assessment & Plan Note (Signed)
Clinically euthyroid on exam today without evidence of goiter. Recent thyroid levels are at target. Is on Brand name Synthroid.

## 2015-01-04 NOTE — Patient Instructions (Addendum)
Keep following healthy lifestyle.  Labs next week.   Please come back for a follow-up appointment in 4 months

## 2015-02-01 ENCOUNTER — Ambulatory Visit: Payer: BC Managed Care – PPO | Admitting: Nurse Practitioner

## 2015-02-09 ENCOUNTER — Encounter: Payer: Self-pay | Admitting: Nurse Practitioner

## 2015-02-09 ENCOUNTER — Ambulatory Visit (INDEPENDENT_AMBULATORY_CARE_PROVIDER_SITE_OTHER): Payer: BC Managed Care – PPO | Admitting: Nurse Practitioner

## 2015-02-09 VITALS — BP 118/82 | HR 66 | Temp 98.1°F | Resp 12 | Ht 62.0 in | Wt 192.8 lb

## 2015-02-09 DIAGNOSIS — G5692 Unspecified mononeuropathy of left upper limb: Secondary | ICD-10-CM

## 2015-02-09 DIAGNOSIS — E281 Androgen excess: Secondary | ICD-10-CM

## 2015-02-09 DIAGNOSIS — E039 Hypothyroidism, unspecified: Secondary | ICD-10-CM

## 2015-02-09 DIAGNOSIS — E282 Polycystic ovarian syndrome: Secondary | ICD-10-CM

## 2015-02-09 DIAGNOSIS — I10 Essential (primary) hypertension: Secondary | ICD-10-CM

## 2015-02-09 LAB — BASIC METABOLIC PANEL
BUN: 16 mg/dL (ref 6–23)
CALCIUM: 10.2 mg/dL (ref 8.4–10.5)
CHLORIDE: 101 meq/L (ref 96–112)
CO2: 30 meq/L (ref 19–32)
CREATININE: 0.95 mg/dL (ref 0.40–1.20)
GFR: 62.77 mL/min (ref >60.00)
GLUCOSE: 105 mg/dL — AB (ref 70–99)
Potassium: 4.8 mEq/L (ref 3.5–5.1)
Sodium: 138 mEq/L (ref 135–145)

## 2015-02-09 LAB — VITAMIN D 25 HYDROXY (VIT D DEFICIENCY, FRACTURES): VITD: 35.29 ng/mL (ref 30.00–100.00)

## 2015-02-09 LAB — HEMOGLOBIN A1C: HEMOGLOBIN A1C: 6 % (ref 4.6–6.5)

## 2015-02-09 NOTE — Progress Notes (Signed)
Pre visit review using our clinic review tool, if applicable. No additional management support is needed unless otherwise documented below in the visit note. 

## 2015-02-09 NOTE — Progress Notes (Signed)
Subjective:    Patient ID: Cynthia Roberson, female    DOB: August 30, 1951, 64 y.o.   MRN: [id number]  HPI  Cynthia Roberson is a 64 yo female establishing care today with a CC of left thumb pain and neuropathy of arm.    3) Left thumb popping yesterday and started tingling, numbness at night time, numbness not apparent during the day, typing a lot. 2 episodes of feeling lost in pad of fingers  Hot lava feeling from posterior lower arm to middle finger, lasted 1 minute; second episode similar and 1 month later    Review of Systems  Constitutional: Negative for fever, chills, diaphoresis and fatigue.  HENT: Negative for tinnitus and trouble swallowing.   Eyes: Negative for visual disturbance.  Respiratory: Negative for chest tightness, shortness of breath and wheezing.   Cardiovascular: Negative for chest pain, palpitations and leg swelling.  Gastrointestinal: Negative for nausea, vomiting, diarrhea and constipation.  Endocrine: Negative for cold intolerance and heat intolerance.  Genitourinary: Negative for difficulty urinating.  Musculoskeletal: Positive for myalgias and arthralgias. Negative for back pain and neck pain.       Left arm  Skin: Negative for rash.  Neurological: Positive for numbness. Negative for dizziness, weakness and headaches.  Psychiatric/Behavioral: Negative for suicidal ideas and sleep disturbance. The patient is not nervous/anxious.    Past Medical History  Diagnosis Date  . Other alopecia   . Hypertension   . Hirsutism   . Hyperandrogenemia   . Unspecified hypothyroidism   . Osteopenia   . Polycystic ovaries     History   Social History  . Marital Status: Married    Spouse Name: N/A  . Number of Children: N/A  . Years of Education: N/A   Occupational History  . Not on file.   Social History Main Topics  . Smoking status: Never Smoker   . Smokeless tobacco: Not on file  . Alcohol Use: 0.6 oz/week    1 Glasses of wine per week  . Drug Use: No  .  Sexual Activity: Yes   Other Topics Concern  . Not on file   Social History Narrative    Past Surgical History  Procedure Laterality Date  . Tonsillectomy    . Wisdom tooth extraction      Family History  Problem Relation Age of Onset  . Heart disease Mother   . Heart disease Father   . Stroke Father   . Hypertension Father     Allergies  Allergen Reactions  . Amoxicillin Rash  . Statins Palpitations    Tried Crestor and others  . Sulfa Antibiotics Rash    Current Outpatient Prescriptions on File Prior to Visit  Medication Sig Dispense Refill  . acetaminophen (TYLENOL) 500 MG tablet Take 500 mg by mouth every 6 (six) hours as needed.    . Ascorbic Acid (VITAMIN C) 1000 MG tablet Take 1,000 mg by mouth daily.    . Biotin 5 MG TABS Take by mouth.    . Cholecalciferol (VITAMIN D) 2000 UNITS tablet Take 2,000 Units by mouth daily.    . Coenzyme Q10 (COQ10) 100 MG CAPS Take 1 capsule by mouth daily.    . Flaxseed, Linseed, (FLAXSEED OIL) 1200 MG CAPS Take 1 capsule by mouth daily.    [name] levothyroxine (SYNTHROID, LEVOTHROID) 50 MCG tablet Take 50 mcg by mouth daily before breakfast.    . loratadine (CLARITIN) 10 MG tablet Take 10 mg by mouth daily.    . Magnesium 250  MG TABS Take 1 tablet by mouth daily.    . Minoxidil (ROGAINE MENS) 5 % FOAM Apply topically.    . ranitidine (ZANTAC) 150 MG tablet Take 150 mg by mouth 2 (two) times daily.    [name] spironolactone (ALDACTONE) 25 MG tablet Take 1 tablet (25 mg total) by mouth daily. 30 tablet 4  . vitamin B-12 (CYANOCOBALAMIN) 500 MCG tablet Take 500 mcg by mouth daily.    . vitamin E 400 UNIT capsule Take 400 Units by mouth daily.    . Zinc 50 MG TABS Take 1 tablet by mouth daily.     No current facility-administered medications on file prior to visit.       Objective:   Physical Exam  Constitutional: She is oriented to person, place, and time. She appears well-developed and well-nourished. No distress.  BP 118/82 mmHg   Pulse 66  Temp(Src) 98.1 F (36.7 C) (Oral)  Resp 12  Ht 5\' 2"  (1.575 m)  Wt 192 lb 12.8 oz (87.454 kg)  BMI 35.25 kg/m2  SpO2 96%   HENT:  Head: Normocephalic and atraumatic.  Right Ear: External ear normal.  Left Ear: External ear normal.  Eyes: Right eye exhibits no discharge. Left eye exhibits no discharge. No scleral icterus.  Neck: Normal range of motion. Neck supple. No thyromegaly present.  Cardiovascular: Normal rate, regular rhythm, normal heart sounds and intact distal pulses.  Exam reveals no gallop and no friction rub.   No murmur heard. Pulmonary/Chest: Effort normal and breath sounds normal. No respiratory distress. She has no wheezes. She has no rales. She exhibits no tenderness.  Lymphadenopathy:    She has no cervical adenopathy.  Neurological: She is alert and oriented to person, place, and time. No cranial nerve deficit. She exhibits normal muscle tone. Coordination normal.  Skin: Skin is warm and dry. No rash noted. She is not diaphoretic.  Psychiatric: She has a normal mood and affect. Her behavior is normal. Judgment and thought content normal.      Assessment & Plan:

## 2015-02-09 NOTE — Patient Instructions (Addendum)
Follow up in 3 months.   Welcome to [facility]!

## 2015-02-09 NOTE — Progress Notes (Signed)
Agree with Dr. Boyd Kerbs note.

## 2015-02-10 ENCOUNTER — Encounter: Payer: Self-pay | Admitting: Endocrinology

## 2015-02-10 ENCOUNTER — Other Ambulatory Visit: Payer: Self-pay | Admitting: Endocrinology

## 2015-02-10 ENCOUNTER — Encounter: Payer: Self-pay | Admitting: Nurse Practitioner

## 2015-02-10 MED ORDER — METFORMIN HCL 500 MG PO TABS: 500.0000 mg | ORAL_TABLET | Freq: Two times a day (BID) | ORAL | Status: DC

## 2015-02-13 ENCOUNTER — Other Ambulatory Visit: Payer: Self-pay | Admitting: Endocrinology

## 2015-02-13 DIAGNOSIS — I10 Essential (primary) hypertension: Secondary | ICD-10-CM

## 2015-02-13 MED ORDER — NEBIVOLOL HCL 5 MG PO TABS: 5.0000 mg | ORAL_TABLET | Freq: Every day | ORAL | Status: DC

## 2015-02-19 DIAGNOSIS — G5692 Unspecified mononeuropathy of left upper limb: Secondary | ICD-10-CM | POA: Insufficient documentation

## 2015-02-19 NOTE — Assessment & Plan Note (Signed)
Discussed possible treatment options such as gabapentin, neuro referral for EMG, and/or brace of left wrist. She would like to wait at this time due to intermittent symptoms. Will follow.

## 2015-02-19 NOTE — Assessment & Plan Note (Signed)
Stable on Metformin and Bystolic. Getting BMET ordered by Dr. Howell Rucks. Will follow.

## 2015-02-19 NOTE — Assessment & Plan Note (Signed)
Followed by Dr. Howell Rucks. She is stable.

## 2015-02-19 NOTE — Assessment & Plan Note (Signed)
Stable on Levothyroxine 50 mcg daily. Sees Dr. Howell Rucks

## 2015-02-19 NOTE — Assessment & Plan Note (Signed)
Followed by Dr. Howell Rucks. Stable on spironolactone.

## 2015-03-09 ENCOUNTER — Encounter: Payer: Self-pay | Admitting: Nurse Practitioner

## 2015-03-23 ENCOUNTER — Encounter: Payer: Self-pay | Admitting: Endocrinology

## 2015-03-24 ENCOUNTER — Other Ambulatory Visit: Payer: Self-pay | Admitting: Endocrinology

## 2015-03-24 MED ORDER — METFORMIN HCL ER (OSM) 500 MG PO TB24: 500.0000 mg | ORAL_TABLET | Freq: Two times a day (BID) | ORAL | Status: DC

## 2015-04-03 ENCOUNTER — Telehealth: Payer: Self-pay | Admitting: *Deleted

## 2015-04-03 NOTE — Telephone Encounter (Signed)
-----   Message from Haydee Monica, MD sent at 03/31/2015  4:53 PM EDT ----- Regarding: PA for metformin Er Please see recent correspondence notes from patient regarding PA. thanks

## 2015-04-04 MED ORDER — METFORMIN HCL ER 500 MG PO TB24: 500.0000 mg | ORAL_TABLET | Freq: Two times a day (BID) | ORAL | Status: DC

## 2015-04-04 NOTE — Telephone Encounter (Signed)
Sent mychart

## 2015-04-24 ENCOUNTER — Other Ambulatory Visit: Payer: Self-pay | Admitting: Endocrinology

## 2015-04-24 ENCOUNTER — Other Ambulatory Visit: Payer: Self-pay

## 2015-04-26 ENCOUNTER — Ambulatory Visit (INDEPENDENT_AMBULATORY_CARE_PROVIDER_SITE_OTHER): Payer: Medicare PPO | Admitting: Endocrinology

## 2015-04-26 ENCOUNTER — Encounter: Payer: Self-pay | Admitting: Endocrinology

## 2015-04-26 VITALS — BP 118/80 | HR 68 | Resp 12 | Ht 62.0 in | Wt 193.0 lb

## 2015-04-26 DIAGNOSIS — E039 Hypothyroidism, unspecified: Secondary | ICD-10-CM | POA: Diagnosis not present

## 2015-04-26 DIAGNOSIS — E281 Androgen excess: Secondary | ICD-10-CM

## 2015-04-26 DIAGNOSIS — E282 Polycystic ovarian syndrome: Secondary | ICD-10-CM

## 2015-04-26 LAB — T4, FREE: Free T4: 0.88 ng/dL (ref 0.60–1.60)

## 2015-04-26 LAB — TSH: TSH: 1.97 u[IU]/mL (ref 0.35–4.50)

## 2015-04-26 MED ORDER — GLUCOSE BLOOD VI STRP: ORAL_STRIP | Status: DC

## 2015-04-26 MED ORDER — SPIRONOLACTONE 25 MG PO TABS: 25.0000 mg | ORAL_TABLET | Freq: Every day | ORAL | Status: DC

## 2015-04-26 MED ORDER — ONETOUCH DELICA LANCETS 33G MISC: Status: DC

## 2015-04-26 NOTE — Patient Instructions (Signed)
Check labs today.  Will discuss ovarian surgery with Dr Ronita Hipps.  One touch meter and supplies.  Refills.  Titrate up metformin ER slowly per tolerance to 1000 mg daily.  Please come back for a follow-up appointment in 3 months

## 2015-04-26 NOTE — Assessment & Plan Note (Addendum)
Last Testosterone levels were improving together , however patient has noticed mild prominence of abnormal hair after last dose reduction in Spironolactone.  Encouraged diet and exercise in an effort to lose weight.  Recent K levels are at target on reduced dose of Sprinolactone. Spironolactone controlling her hirsuitism relatively well. She has tried vaniqua in the past, however it has been too expensive. We also discussed possibly adding HCTZ at future visits to the [facility] to control the associated side effect of hyperkalemia versus move onto specific anti-androgen therapy versus consider ovarian surgery if the Testosterone levels continue to rise.  She had declined ovarian Surgery in the past, but is willing to consider it this time . She understands the risk of missing a small androgen producing tumor even with a normal imaging study. It is reassuring that the Testosterone levels are not rapidly rising. Update today. If rising again, then might favor Ovarian surgery, which in turn will probably help with reducing hyperandrogenemia and rule out any tumors, but will not help with insulin resistance. Will discuss with Dr Ronita Hipps.  She will continue current Spirinoloactone for now, as hirsutism is about the same-slight worsening.

## 2015-04-26 NOTE — Assessment & Plan Note (Addendum)
Clinically euthyroid on exam today without evidence of goiter. last thyroid levels are at target. Is on Brand name Synthroid. Update levels today.

## 2015-04-26 NOTE — Assessment & Plan Note (Signed)
Likely diagnosis based on her prior hx irregular menses, androgenic alopecia and elevated Testosterone levels.  We discussed that diagnostic criteria in her age group are not as well defined at prior visits.   Recent ovarian US was normal and didn't show any ovarian tumors.   She will continue on current metformin ER and try to increase that in 2-3 weeks to either 1000 mg at qhs or 500 mg twice daily as tolerated.  Encouraged diet and exercise in an effort to lose weight. Check sugars 1-2 times weekly- given One Touch verio flex meter and test strips.

## 2015-04-26 NOTE — Progress Notes (Signed)
Patient ID: Cynthia Roberson, female   DOB: [birth year], 64 y.o.   MRN: [id number]    Reason for visit: Hypothyroidism, PCOS, hyperandrogenemia  HPI  Cynthia Roberson is a 64 y.o.-year-old female,  is here for  Follow up for hypothyroidism, elevated Testosterone levels and postmenopausal PCOS. Dr Ester Rink is her dermatologist at [facility]. 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 Dr Ronita Hipps is her GYN.    Hypothyroidism- Patient recalls being diagnosed with hypothyroidism in 2002 following blood work. She takes Synthroid 50 mcg daily, and her dose has not changed recently. The patient is taking her medication as directed and has not missed any doses.  Patient has been taking this medication fasting, with water, separate from breakfast > 30 minutes and from pills ( calcium, iron, PPIs, multivitamins)> 4 hours.   I reviewed pt's thyroid tests: Lab Results  Component Value Date   TSH 2.02 10/11/2014   TSH 0.91 07/06/2014   TSH 3.17 02/25/2014    Review of systems: [ x  ] complains of    [  ] denies [ x ] weight fluctuates, attributes this due to her lifestyle and joint swelling  [  ] constipation  [  x] fatigue  -on and off [ x ] dry skin-chronic  [  ] cold intolerance [  ] hair loss [  ] noticing any enlargement in size of thyroid [  ] lumps in neck [  ] dysphagia [  ] recent change in voice [  ]  SOB with lying down.  Elevated Testosterone levels, and PCO S.-  The patient has a history of scalp, hair thinning since her younger years, for which she has been using prescription strength, Rogaine with good results.She is now using it only once daily and hair thinning has slowed down.  She also has noticed increased hair growth over her face, shoulders, chest, abdomen, and change in the texture of these hairs over past several years, with recent improvement. Testosterone level checked, was elevated at 130 in 2013. The patient has been on spironolactone  treatment since 2014, August and her testosterone levels had improved to the 96 range on recheck. CT abdomen was negative for any ovarian/adrenal tumors in July 2014. The patient denies any problem with acne. She is now postmenopausal since Around age 13. She had a always had a history of irregular menses and had been on birth control in the past. She had difficulty with conception and had used Clomid. She has had 2 successful pregnancies in the past. Following this, the patient had regular menses for some time until she attained menopause. The patient does not have a history of diabetes mellitus. She has preDiabetes. Recently hasn't been keeping up with her lifestyle efforts and gained weight.  At last check in 2015, the patient had mild elevation in her potassium level, following which dose of spironolactone was reduced from 50 mg to 25 milligrams daily. Patient is tolerating the medication well except for occasional cough, and feels that her hirsutism is slightly worse than last time, but managble.  Testosterone levels had decreased last visit, and ovarian US was negative Sept 2015.  Started on metformin April- did not tolerate due to diarrhea. Symptoms resolved within 24 hours of stopping metformin.  Then changed to metformin Er and tolerating it better wrt GI aspects. Tried to go up quickly to the 500 mg twice daily dose, but noticed that her diastolic BP went up to  90s, and she didn't feel right, so now back down to 500 mg daily and doing better.  Has been trying to eat healthy and walk for exercise.  Doesn't check her sugars.    Lab Results  Component Value Date   HGBA1C 6.0 02/09/2015   Lab Results  Component Value Date   NA 138 02/09/2015    Lab Results  Component Value Date   K 4.8 02/09/2015    Testosterone levels Sept 2015 was 173, Dec 2015 was 97  Past Medical History  Diagnosis Date  . Other alopecia   . Hypertension   . Hirsutism   . Hyperandrogenemia   . Unspecified  hypothyroidism   . Osteopenia   . Polycystic ovaries    Past Surgical History  Procedure Laterality Date  . Tonsillectomy    . Wisdom tooth extraction     History   Social History  . Marital Status: Married    Spouse Name: N/A  . Number of Children: N/A  . Years of Education: N/A   Occupational History  . Not on file.   Social History Main Topics  . Smoking status: Never Smoker   . Smokeless tobacco: Not on file  . Alcohol Use: 0.6 oz/week    1 Glasses of wine per week  . Drug Use: No  . Sexual Activity: Yes   Other Topics Concern  . Not on file   Social History Narrative   Current Outpatient Prescriptions on File Prior to Visit  Medication Sig Dispense Refill  . acetaminophen (TYLENOL) 500 MG tablet Take 500 mg by mouth every 6 (six) hours as needed.    . Ascorbic Acid (VITAMIN C) 1000 MG tablet Take 1,000 mg by mouth daily.    . Biotin 5 MG TABS Take by mouth.    . Cholecalciferol (VITAMIN D) 2000 UNITS tablet Take 2,000 Units by mouth daily.    . Coenzyme Q10 (COQ10) 100 MG CAPS Take 1 capsule by mouth daily.    [name] levothyroxine (SYNTHROID, LEVOTHROID) 50 MCG tablet Take 50 mcg by mouth daily before breakfast.    . loratadine (CLARITIN) 10 MG tablet Take 10 mg by mouth daily as needed.     . Magnesium 250 MG TABS Take 1 tablet by mouth daily.    . metFORMIN (GLUCOPHAGE-XR) 500 MG 24 hr tablet Take 1 tablet (500 mg total) by mouth 2 (two) times daily. 60 tablet 3  . Minoxidil (ROGAINE MENS) 5 % FOAM Apply topically.    . nebivolol (BYSTOLIC) 5 MG tablet Take 1 tablet (5 mg total) by mouth daily. 30 tablet 1  . ranitidine (ZANTAC) 150 MG tablet Take 150 mg by mouth 2 (two) times daily as needed.     . vitamin B-12 (CYANOCOBALAMIN) 500 MCG tablet Take 500 mcg by mouth daily.    . vitamin E 400 UNIT capsule Take 400 Units by mouth daily.    . Zinc 50 MG TABS Take 1 tablet by mouth daily.     No current facility-administered medications on file prior to visit.    Allergies  Allergen Reactions  . Amoxicillin Rash  . Statins Palpitations    Tried Crestor and others  . Sulfa Antibiotics Rash   Family History  Problem Relation Age of Onset  . Heart disease Mother   . Heart disease Father   . Stroke Father   . Hypertension Father     I reviewed her chart and she also has a history of HTN for which she  is Bystolic.Going to discuss with her PCP regarding coming off it at next visits.   Also, recently has been taking calcium and Vitamin D daily. Takes around 4000 units Vitamin D recently.    PE: BP 118/80 mmHg  Pulse 68  Resp 12  Ht 5\' 2"  (1.575 m)  Wt 193 lb (87.544 kg)  BMI 35.29 kg/m2  SpO2 95% Wt Readings from Last 3 Encounters:  04/26/15 193 lb (87.544 kg)  02/09/15 192 lb 12.8 oz (87.454 kg)  01/04/15 193 lb 12 oz (87.884 kg)    HEENT: [place]/AT, EOMI, no icterus, no proptosis, no chemosis, no mild lid lag, no retraction, eyes close completely Neck: thyroid gland - smooth, non-tender, no erythema, no tracheal deviation; negative Pemberton's sign; no lympahadenopathy; no bruits, mild hirsuitism over face Lungs: good air entry, clear bilaterally Heart: S1&S2 normal, regular rate & rhythm; no murmurs, rubs or gallops Ext: no tremor in hands bilaterally, no edema, 2+ DP/PT pulses, good muscle mass Neuro: normal gait, 2+ reflexes bilaterally, normal 5/5 strength, no proximal myopathy  Derm: no pretibial myxoedema/skin dryness   ASSESSMENT: 1. Hypothyroidism 2. Hyperandrogenemia 3. PCOS  PLAN:  Problem List Items Addressed This Visit      Endocrine   PCOS (polycystic ovarian syndrome)    Likely diagnosis based on her prior hx irregular menses, androgenic alopecia and elevated Testosterone levels.  We discussed that diagnostic criteria in her age group are not as well defined at prior visits.   Recent ovarian US was normal and didn't show any ovarian tumors.   She will continue on current metformin ER and try to increase that  in 2-3 weeks to either 1000 mg at qhs or 500 mg twice daily as tolerated.  Encouraged diet and exercise in an effort to lose weight. Check sugars 1-2 times weekly- given One Touch verio flex meter and test strips.              Relevant Medications   spironolactone (ALDACTONE) 25 MG tablet   Other Relevant Orders   TESTOSTERONE, TOTAL, LC/MS   TSH   T4, free   Hypothyroidism    Clinically euthyroid on exam today without evidence of goiter. last thyroid levels are at target. Is on Brand name Synthroid. Update levels today.             Relevant Orders   TESTOSTERONE, TOTAL, LC/MS   TSH   T4, free     Other   Hyperandrogenemia - Primary    Last Testosterone levels were improving together , however patient has noticed mild prominence of abnormal hair after last dose reduction in Spironolactone.  Encouraged diet and exercise in an effort to lose weight.  Recent K levels are at target on reduced dose of Sprinolactone. Spironolactone controlling her hirsuitism relatively well. She has tried vaniqua in the past, however it has been too expensive. We also discussed possibly adding HCTZ at future visits to the [place] to control the associated side effect of hyperkalemia versus move onto specific anti-androgen therapy versus consider ovarian surgery if the Testosterone levels continue to rise.  She had declined ovarian Surgery in the past, but is willing to consider it this time . She understands the risk of missing a small androgen producing tumor even with a normal imaging study. It is reassuring that the Testosterone levels are not rapidly rising. Update today. If rising again, then might favor Ovarian surgery, which in turn will probably help with reducing hyperandrogenemia and rule out any tumors, but  will not help with insulin resistance. Will discuss with Dr Ronita Hipps.  She will continue current Spirinoloactone for now, as hirsutism is about the same-slight worsening.            Relevant Medications   spironolactone (ALDACTONE) 25 MG tablet   Other Relevant Orders   TESTOSTERONE, TOTAL, LC/MS   TSH   T4, free        -RTC in 3 months. Explained that I am [place] out of state and she has elected to follow up with Dr Cruzita Lederer at [facility].   Lore Polka [facility]  04/26/2015   1:06 PM

## 2015-04-26 NOTE — Progress Notes (Signed)
Pre visit review using our clinic review tool, if applicable. No additional management support is needed unless otherwise documented below in the visit note. 

## 2015-04-28 ENCOUNTER — Encounter: Payer: Self-pay | Admitting: Endocrinology

## 2015-04-28 ENCOUNTER — Other Ambulatory Visit: Payer: Self-pay | Admitting: *Deleted

## 2015-04-28 ENCOUNTER — Other Ambulatory Visit: Payer: Self-pay | Admitting: Endocrinology

## 2015-04-28 ENCOUNTER — Encounter: Payer: Self-pay | Admitting: *Deleted

## 2015-04-28 DIAGNOSIS — E282 Polycystic ovarian syndrome: Secondary | ICD-10-CM

## 2015-04-28 HISTORY — PX: COLONOSCOPY: SHX174

## 2015-04-28 LAB — BASIC METABOLIC PANEL
BUN / CREAT RATIO: 21 (ref 11–26)
BUN: 21 mg/dL (ref 8–27)
CO2: 23 mmol/L (ref 18–29)
Calcium: 10.1 mg/dL (ref 8.7–10.3)
Chloride: 103 mmol/L (ref 97–108)
Creatinine, Ser: 0.98 mg/dL (ref 0.57–1.00)
GFR calc Af Amer: 70 mL/min/{1.73_m2} (ref >59)
GFR calc non Af Amer: 61 mL/min/{1.73_m2} (ref >59)
Glucose: 109 mg/dL — ABNORMAL HIGH (ref 65–99)
POTASSIUM: 5.4 mmol/L — AB (ref 3.5–5.2)
Sodium: 142 mmol/L (ref 134–144)

## 2015-04-28 LAB — AMBIG ABBREV BMP8 DEFAULT

## 2015-04-28 LAB — TESTOSTERONE, TOTAL, LC/MS: Testosterone, total: 122.7 ng/dL — ABNORMAL HIGH (ref 7.0–40.0)

## 2015-04-28 MED ORDER — NEBIVOLOL HCL 5 MG PO TABS: 5.0000 mg | ORAL_TABLET | Freq: Every day | ORAL | Status: DC

## 2015-05-02 ENCOUNTER — Other Ambulatory Visit: Payer: Self-pay | Admitting: Endocrinology

## 2015-05-02 DIAGNOSIS — E039 Hypothyroidism, unspecified: Secondary | ICD-10-CM

## 2015-05-02 MED ORDER — LEVOTHYROXINE SODIUM 50 MCG PO TABS: 50.0000 ug | ORAL_TABLET | Freq: Every day | ORAL | Status: DC

## 2015-05-05 ENCOUNTER — Encounter: Payer: Self-pay | Admitting: Endocrinology

## 2015-05-08 ENCOUNTER — Other Ambulatory Visit (INDEPENDENT_AMBULATORY_CARE_PROVIDER_SITE_OTHER): Payer: Medicare PPO

## 2015-05-08 DIAGNOSIS — E282 Polycystic ovarian syndrome: Secondary | ICD-10-CM

## 2015-05-08 LAB — BASIC METABOLIC PANEL
BUN: 16 mg/dL (ref 6–23)
CALCIUM: 10 mg/dL (ref 8.4–10.5)
CO2: 27 mEq/L (ref 19–32)
Chloride: 101 mEq/L (ref 96–112)
Creatinine, Ser: 0.98 mg/dL (ref 0.40–1.20)
GFR: 60.51 mL/min (ref >60.00)
Glucose, Bld: 94 mg/dL (ref 70–99)
Potassium: 4.6 mEq/L (ref 3.5–5.1)
SODIUM: 137 meq/L (ref 135–145)

## 2015-05-10 NOTE — Telephone Encounter (Signed)
Your patient 

## 2015-05-11 ENCOUNTER — Ambulatory Visit (INDEPENDENT_AMBULATORY_CARE_PROVIDER_SITE_OTHER): Payer: Medicare PPO | Admitting: Nurse Practitioner

## 2015-05-11 ENCOUNTER — Ambulatory Visit: Payer: BC Managed Care – PPO | Admitting: Endocrinology

## 2015-05-11 ENCOUNTER — Encounter: Payer: Self-pay | Admitting: Nurse Practitioner

## 2015-05-11 VITALS — BP 124/88 | HR 69 | Temp 98.1°F | Resp 14 | Ht 62.0 in | Wt 193.0 lb

## 2015-05-11 DIAGNOSIS — I10 Essential (primary) hypertension: Secondary | ICD-10-CM

## 2015-05-11 DIAGNOSIS — E282 Polycystic ovarian syndrome: Secondary | ICD-10-CM

## 2015-05-11 DIAGNOSIS — E039 Hypothyroidism, unspecified: Secondary | ICD-10-CM

## 2015-05-11 NOTE — Assessment & Plan Note (Signed)
Synthroid was refilled by Dr. Howell Rucks. Ms. Thoen reports having to take the brand. Will consider this in the future.

## 2015-05-11 NOTE — Patient Instructions (Signed)
Continue to follow up in September for your physical exam- with fasting labs.

## 2015-05-11 NOTE — Assessment & Plan Note (Signed)
Following up soon with Dr. Cruzita Lederer. BMET yesterday normal.

## 2015-05-11 NOTE — Progress Notes (Signed)
Pre visit review using our clinic review tool, if applicable. No additional management support is needed unless otherwise documented below in the visit note. 

## 2015-05-11 NOTE — Progress Notes (Signed)
Subjective:    Patient ID: Cynthia Roberson, female    DOB: [birth year], 64 y.o.   MRN: [id number]  HPI  Cynthia Roberson is a 64 yo female with a follow up with a trigger   1) Trigger thumb improving   2) BP- Avg 116/80 over 10 readings   3) BS- between 101-112  69- 7/10, did not feel any symptoms, had shrimp,  kilbasa, and broccolli  Doing program call "Trim healthy mama"   Silver sneakers and aquatic center- starting tomorrow     Review of Systems  Constitutional: Negative for fever, chills, diaphoresis and fatigue.  Respiratory: Negative for chest tightness, shortness of breath and wheezing.   Cardiovascular: Negative for chest pain, palpitations and leg swelling.  Gastrointestinal: Negative for nausea, vomiting and diarrhea.  Endocrine: Negative for polydipsia, polyphagia and polyuria.  Musculoskeletal: Positive for arthralgias.  Skin: Negative for rash.  Neurological: Negative for dizziness, weakness, numbness and headaches.  Psychiatric/Behavioral: The patient is not nervous/anxious.    Past Medical History  Diagnosis Date  . Other alopecia   . Hypertension   . Hirsutism   . Hyperandrogenemia   . Unspecified hypothyroidism   . Osteopenia   . Polycystic ovaries     History   Social History  . Marital Status: Married    Spouse Name: N/A  . Number of Children: N/A  . Years of Education: N/A   Occupational History  . Not on file.   Social History Main Topics  . Smoking status: Never Smoker   . Smokeless tobacco: Not on file  . Alcohol Use: 0.6 oz/week    1 Glasses of wine per week  . Drug Use: No  . Sexual Activity: Yes   Other Topics Concern  . Not on file   Social History Narrative    Past Surgical History  Procedure Laterality Date  . Tonsillectomy    . Wisdom tooth extraction      Family History  Problem Relation Age of Onset  . Heart disease Mother   . Heart disease Father   . Stroke Father   . Hypertension Father     Allergies  Allergen  Reactions  . Amoxicillin Rash  . Statins Palpitations    Tried Crestor and others  . Sulfa Antibiotics Rash    Current Outpatient Prescriptions on File Prior to Visit  Medication Sig Dispense Refill  . acetaminophen (TYLENOL) 500 MG tablet Take 500 mg by mouth every 6 (six) hours as needed.    . Ascorbic Acid (VITAMIN C) 1000 MG tablet Take 1,000 mg by mouth daily.    . Biotin 5 MG TABS Take by mouth.    . Cholecalciferol (VITAMIN D) 2000 UNITS tablet Take 2,000 Units by mouth daily.    . Coenzyme Q10 (COQ10) 100 MG CAPS Take 1 capsule by mouth daily.    [name] glucose blood (ONETOUCH VERIO) test strip Use to test sugars two times weekly 30 each 12  . ibuprofen (ADVIL,MOTRIN) 200 MG tablet Take 200 mg by mouth every 6 (six) hours as needed. 1-2 prn    . loratadine (CLARITIN) 10 MG tablet Take 10 mg by mouth daily as needed.     . Magnesium 250 MG TABS Take 1 tablet by mouth daily.    . metFORMIN (GLUCOPHAGE-XR) 500 MG 24 hr tablet Take 1 tablet (500 mg total) by mouth 2 (two) times daily. 60 tablet 3  . Minoxidil (ROGAINE MENS) 5 % FOAM Apply topically.    . nebivolol (BYSTOLIC)  5 MG tablet Take 1 tablet (5 mg total) by mouth daily. 30 tablet 1  . ONETOUCH DELICA LANCETS 90W MISC Use to test sugars 2 times weekly 30 each 11  . ranitidine (ZANTAC) 150 MG tablet Take 150 mg by mouth 2 (two) times daily as needed.     [name] spironolactone (ALDACTONE) 25 MG tablet Take 1 tablet (25 mg total) by mouth daily. 30 tablet 6  . vitamin B-12 (CYANOCOBALAMIN) 500 MCG tablet Take 500 mcg by mouth daily.    . vitamin E 400 UNIT capsule Take 400 Units by mouth daily.    . Zinc 50 MG TABS Take 1 tablet by mouth daily.     No current facility-administered medications on file prior to visit.      Objective:   Physical Exam  Constitutional: She is oriented to person, place, and time. She appears well-developed and well-nourished. No distress.  BP 124/88 mmHg  Pulse 69  Temp(Src) 98.1 F (36.7 C)  Resp 14   Ht 5\' 2"  (1.575 m)  Wt 193 lb (87.544 kg)  BMI 35.29 kg/m2  SpO2 97%   HENT:  Head: Normocephalic and atraumatic.  Right Ear: External ear normal.  Left Ear: External ear normal.  Cardiovascular: Normal rate, regular rhythm, normal heart sounds and intact distal pulses.  Exam reveals no gallop and no friction rub.   No murmur heard. Pulmonary/Chest: Effort normal and breath sounds normal. No respiratory distress. She has no wheezes. She has no rales. She exhibits no tenderness.  Neurological: She is alert and oriented to person, place, and time. No cranial nerve deficit. She exhibits normal muscle tone. Coordination normal.  Skin: Skin is warm and dry. No rash noted. She is not diaphoretic.  Psychiatric: She has a normal mood and affect. Her behavior is normal. Judgment and thought content normal.      Assessment & Plan:  I have spent 35 minutes with the patient face to face and greater than 50% of the time spent counseling on diet, exercise, BS, and HTN.   In [place]

## 2015-05-11 NOTE — Assessment & Plan Note (Signed)
BP Readings from Last 3 Encounters:  05/11/15 124/88  04/26/15 118/80  02/09/15 118/82   Stable on Bystolic. Discussed diet and exercise. Will follow.

## 2015-05-25 DIAGNOSIS — D126 Benign neoplasm of colon, unspecified: Secondary | ICD-10-CM

## 2015-05-25 HISTORY — DX: Benign neoplasm of colon, unspecified: D12.6

## 2015-06-28 ENCOUNTER — Other Ambulatory Visit: Payer: Self-pay | Admitting: Nurse Practitioner

## 2015-07-27 ENCOUNTER — Ambulatory Visit (INDEPENDENT_AMBULATORY_CARE_PROVIDER_SITE_OTHER): Payer: Medicare PPO | Admitting: Internal Medicine

## 2015-07-27 ENCOUNTER — Encounter: Payer: Self-pay | Admitting: Internal Medicine

## 2015-07-27 ENCOUNTER — Ambulatory Visit: Payer: Medicare PPO | Admitting: Internal Medicine

## 2015-07-27 VITALS — BP 102/64 | HR 65 | Temp 97.7°F | Resp 12 | Ht 62.0 in | Wt 186.6 lb

## 2015-07-27 DIAGNOSIS — E039 Hypothyroidism, unspecified: Secondary | ICD-10-CM | POA: Diagnosis not present

## 2015-07-27 DIAGNOSIS — E281 Androgen excess: Secondary | ICD-10-CM | POA: Diagnosis not present

## 2015-07-27 DIAGNOSIS — R7303 Prediabetes: Secondary | ICD-10-CM

## 2015-07-27 DIAGNOSIS — R7309 Other abnormal glucose: Secondary | ICD-10-CM | POA: Diagnosis not present

## 2015-07-27 DIAGNOSIS — E282 Polycystic ovarian syndrome: Secondary | ICD-10-CM | POA: Diagnosis not present

## 2015-07-27 NOTE — Patient Instructions (Addendum)
Please come back for a follow-up appointment in 6 months.  Continue Metformin XR 500 mg x 2 tablet daily.  Please come back for a follow-up appointment in 6 months.

## 2015-07-27 NOTE — Progress Notes (Signed)
Patient ID: Cynthia Roberson, female   DOB: [birth year], 64 y.o.   MRN: [id number]   HPI  Cynthia Roberson is a 64 y.o.-year-old female, referred by Dr.Doss, for management of post menopausal hyperandrogenism. She has previously been seen by Dr. Howell Rucks, who since left the practice. She is here with her husband, who offers part of the history.  Patient had a long history of hyperandrogenism (high testosterone level), and has a history of PCOS + infertility. She could not tolerate OCPs in the past (migraines).   Reviewed the records from Dr. Howell Rucks:  The patient has a history of scalp hair thinning since her younger years, for which she has been using prescription strength, Rogaine with good results. She is now using it only once daily and hair thinning has slowed down. She also has noticed increased hair growth over her face, shoulders, chest, abdomen, and change in the texture of these hairs over past several years, with some improvement after starting spironolactone.  She denies any problem with acne.  She is now postmenopausal since ~ age 75. She had a always had a history of irregular menses and had been on birth control in the past. She had difficulty with conception and had used Clomid. She has had 2 successful pregnancies in the past. Following this, the patient had regular menses for some time until she attained menopause.  Patient denies signs of masculinization.  Testosterone level checked, was elevated at 130 in 2013.  Subsequent levels were still high, however, all of the levels below were drawn while on spironolactone: Component     Latest Ref Rng 07/06/2014 07/14/2014 10/11/2014 04/26/2015  Testosterone     3 - 41 ng/dL 173 (H)  97 (H)   Sex Hormone Binding     18 - 114 nmol/L 31     Testosterone Free     0.0 - 4.2 pg/mL 33.8 (H)  6.1 (H)   Testosterone-% Free     0.4 - 2.4 % 2.0     FSH      26.1     LH      20.3     DHEA-SO4     29.4 - 220.5 ug/dL  77.0    Testosterone, total     7.0 - 40.0 ng/dL    122.7 (H)   The patient has been on spironolactone treatment since 05/2013 and her testosterone levels had improved to the 96 range on recheck. In 2015, the patient had mild elevation in her potassium level, following which dose of spironolactone was reduced from 50 to 25 mg  daily. Patient is tolerating the medication well except for occasional cough, and feels that her hirsutism is slightly worse than last time, but manageable.   - Ruled out for Cushing's syndrome based on 1 mg dex suppression test- morning cortisol is 1.47 (05/2013) - DHEAS 44.3, Prolactin 5.9 (05/2013) - CT Abdomen and pelvis with contrast (04/2013) - No adrenal mass or any ovarian abnormality - Ovarian US was negative (06/2014)  At last visit with Dr.Phadke  3 months ago, the use of flutamide was discussed, as well as the possibility of oophorectomy.   She is also seeing Dr Ronita Hipps [facility]) and Dr Ubaldo Glassing (derm).  She also has a history of hypothyroidism, well controlled. - Dx 1995 - She is on Synthroid d.a.w. 50 g daily. - She is on biotin 5000 g daily - Last thyroid test:  Lab Results  Component Value Date   TSH 1.97 04/26/2015   The patient  does not have a history of diabetes >> She has prediabetes. She did not tolerate  regular metformin in the past.She is on metformin extended-release 50 mg twice a day now. - Last HbA1c:  Lab Results  Component Value Date   HGBA1C 6.0 02/09/2015   Checks sugars 1x a day: 69 x1, otherwise 96-124.  She tries to reduce gluten.  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 Other alopecia   . Hypertension   . Hirsutism   . Hyperandrogenemia   . Unspecified hypothyroidism   . Osteopenia   . Polycystic ovaries    Past Surgical History  Procedure Laterality Date  . Tonsillectomy    . Wisdom tooth extraction     Social History   Social History  . Marital Status: Married    Spouse Name: N/A  . Number of Children: 2   Occupational History  . Retired Pharmacist, hospital, now Counsellor   Social History Main Topics  . Smoking status: Never Smoker   . Smokeless tobacco: Not on file  . Alcohol Use:     1-3 Glasses of wine per year  . Drug Use: No  . Sexual Activity: Yes   Current Outpatient Prescriptions on File Prior to Visit  Medication Sig Dispense Refill  . acetaminophen (TYLENOL) 500 MG tablet Take 500 mg by mouth every 6 (six) hours as needed.    . Ascorbic Acid (VITAMIN C) 1000 MG tablet Take 1,000 mg by mouth daily.    . Biotin 5 MG TABS Take by mouth.    . BYSTOLIC 5 MG tablet TAKE 1 TABLET(5 MG) BY MOUTH DAILY 30 tablet 0  . Cholecalciferol (VITAMIN D) 2000 UNITS tablet Take 2,000 Units by mouth daily.    . Coenzyme Q10 (COQ10) 100 MG CAPS Take 1 capsule by mouth daily.    [name] glucose blood (ONETOUCH VERIO) test strip Use to test sugars two times weekly 30 each 12  . ibuprofen (ADVIL,MOTRIN) 200 MG tablet Take 200 mg by mouth every 6 (six) hours as needed. 1-2 prn    . loratadine (CLARITIN) 10 MG tablet Take 10 mg by mouth daily as needed.     . Magnesium 250 MG TABS Take 1 tablet by mouth daily.    . metFORMIN (GLUCOPHAGE-XR) 500 MG 24 hr tablet Take 1 tablet (500 mg total) by mouth 2 (two) times daily. 60 tablet 3  . Minoxidil (ROGAINE MENS) 5 % FOAM Apply topically.    Glory Rosebush DELICA LANCETS 08U MISC Use to test sugars 2  times weekly 30 each 11  . ranitidine (ZANTAC) 150 MG tablet Take 150 mg by mouth 2 (two) times daily as needed.     [name] spironolactone (ALDACTONE) 25 MG tablet Take 1 tablet (25 mg total) by mouth daily. 30 tablet 6  . SYNTHROID 50 MCG tablet Take 50 mcg by mouth daily before breakfast.    . vitamin B-12 (CYANOCOBALAMIN) 500 MCG tablet Take 500 mcg by mouth daily.    . vitamin E 400 UNIT capsule Take 400 Units by mouth daily.    [name]  Zinc 50 MG TABS Take 1 tablet by mouth daily.     No current facility-administered medications on file prior to visit.   Allergies  Allergen Reactions  . Amoxicillin Rash  . Statins Palpitations    Tried Crestor and others  . Sulfa Antibiotics Rash   Family History  Problem Relation Age of Onset  . Heart disease Mother   . Heart disease Father   . Stroke Father   . Hypertension Father    PE: BP 102/64 mmHg  Pulse 65  Temp(Src) 97.7 F (36.5 C) (Oral)  Resp 12  Ht 5\' 2"  (1.575 m)  Wt 186 lb 9.6 oz (84.641 kg)  BMI 34.12 kg/m2  SpO2 96% Wt Readings from Last 3 Encounters:  07/27/15 186 lb 9.6 oz (84.641 kg)  05/11/15 193 lb (87.544 kg)  04/26/15 193 lb (87.544 kg)   Constitutional: overweight, in NAD Eyes: PERRLA, EOMI, no exophthalmos ENT: moist mucous membranes, no thyromegaly, no cervical lymphadenopathy Cardiovascular: RRR, No MRG Respiratory: CTA B Gastrointestinal: abdomen soft, NT, ND, BS+ Musculoskeletal: no deformities, strength intact in all 4 Skin: moist, warm, no rashes; dark terminal hair on chin, no acne Neurological: no tremor with outstretched hands, DTR normal in all 4  ASSESSMENT: 1. High testosterone level in a postmenopausal woman  2. Hypothyroidism  3. Prediabetes  PLAN:  1. I had a long discussion with the patient about the fact that the high testosterone level in a postmenopausal woman can be secondary to insulin resistance or previous undiagnosed PCOS, or can be a new occurrence in the setting of adrenal tumors,  ovarian tumors or ovarian hyperthecosis. The latter 3 conditions usually occur with higher levels of testosterone, > 150 ng/mL.  - I discussed with Dr. Howell Rucks about the patient, and also reviewed her chart in detail. She has had repeated CT imaging of her adrenals and also a transvaginal ultrasound for evaluation of her ovaries, and these tests were negative for any masses or hyperplasia. Her hormonal testing was negative for hyperprolactinemia, pituitary dysfunction, Cushing syndrome. She was doing well on spironolactone in the past, however, the dose had to be decreased because of hyperkalemia. She is now on the low dose, 25 mg daily. She is also using Rogaine for her female-pattern baldness. She started metformin extended-release for her prediabetes, and she feels that this might help with her hirsutism. She discussed with Dr. Howell Rucks and Dr. Ronita Hipps before, and we also discussed today, about the possibility of performing oophorectomy to look for ovarian hyperthecosis or small hilar ovarian tumors. She would like to hold off with this for now. - We reviewed together her previous labs, and I explained that, while on spironolactone, testosterone levels are not conclusive, since spironolactone mostly blocks the receptors for testosterone and thus preventing its action, rather than decreasing testosterone levels. For this reason, I would not check her testosterone today. - We discussed to see her back in 6 months, and at that time, to check her potassium, hemoglobin A1c, and thyroid tests  2. Hypothyroidism - This appears to be controlled on her current dose of Synthroid 50 g daily - She is taking this correctly - We'll repeat thyroid tests when she returns in 6 months.  3. Prediabetes - Reviewed patient's log >> sugars are at goal - Continue metformin extended-release 1000 mg with dinner - will repeat hemoglobin A1c when she comes back  - time spent with the patient and her husband: 45 min, of which >50%  was spent in obtaining information  about her symptoms, reviewing her previous labs, evaluations, and treatments, counseling her about her condition (please see the discussed topics above), and developing a plan to further investigate it. She had a number of questions which I addressed.

## 2015-07-28 ENCOUNTER — Encounter: Payer: Self-pay | Admitting: Nurse Practitioner

## 2015-07-28 ENCOUNTER — Ambulatory Visit (INDEPENDENT_AMBULATORY_CARE_PROVIDER_SITE_OTHER): Payer: Medicare PPO | Admitting: Nurse Practitioner

## 2015-07-28 VITALS — BP 102/82 | HR 68 | Temp 98.6°F | Resp 14 | Ht 62.0 in | Wt 186.4 lb

## 2015-07-28 DIAGNOSIS — Z0000 Encounter for general adult medical examination without abnormal findings: Secondary | ICD-10-CM | POA: Diagnosis not present

## 2015-07-28 DIAGNOSIS — Z23 Encounter for immunization: Secondary | ICD-10-CM

## 2015-07-28 NOTE — Progress Notes (Addendum)
Patient ID: Cynthia Roberson, female    DOB: 1951/01/29  Age: 64 y.o. MRN: [id number]  CC: Annual Exam   HPI Cynthia Roberson presents for Annual Physical exam and CC of right arm pain.   1) Health Maintenance-   Diet- No formal   Exercise- No formal   Immunizations- Needs flu and pneumonia   Mammogram- 12/2014   Pap- N/A due to age  Bone Density- Dr. Ronita Hipps 2 years ago   Colonoscopy- 2013  Eye Exam- Last year, will wait till next year  Dental Exam- UTD  3) Acute Problems-  Deltoid- left painful after running into a dresser (pt able to move well and does not have pain today).   History [name] has a past medical history of Other alopecia; Hypertension; Hirsutism; Hyperandrogenemia; Unspecified hypothyroidism; Osteopenia; and Polycystic ovaries.   She has past surgical history that includes Tonsillectomy and Wisdom tooth extraction.   Her family history includes Heart disease in her father and mother; Hypertension in her father; Stroke in her father.She reports that she has never smoked. She does not have any smokeless tobacco history on file. She reports that she drinks about 0.6 oz of alcohol per week. She reports that she does not use illicit drugs.  Outpatient Prescriptions Prior to Visit  Medication Sig Dispense Refill  . acetaminophen (TYLENOL) 500 MG tablet Take 500 mg by mouth every 6 (six) hours as needed.    . Ascorbic Acid (VITAMIN C) 1000 MG tablet Take 1,000 mg by mouth daily.    . Biotin 5 MG TABS Take by mouth.    . BYSTOLIC 5 MG tablet TAKE 1 TABLET(5 MG) BY MOUTH DAILY 30 tablet 0  . Cholecalciferol (VITAMIN D) 2000 UNITS tablet Take 2,000 Units by mouth daily.    . Coenzyme Q10 (COQ10) 100 MG CAPS Take 1 capsule by mouth daily.    [name] glucose blood (ONETOUCH VERIO) test strip Use to test sugars two times weekly 30 each 12  . ibuprofen (ADVIL,MOTRIN) 200 MG tablet Take 200 mg by mouth every 6 (six) hours as needed. 1-2 prn    . loratadine (CLARITIN) 10 MG tablet Take  10 mg by mouth daily as needed.     . Magnesium 250 MG TABS Take 1 tablet by mouth daily.    . metFORMIN (GLUCOPHAGE-XR) 500 MG 24 hr tablet Take 1 tablet (500 mg total) by mouth 2 (two) times daily. 60 tablet 3  . Minoxidil (ROGAINE MENS) 5 % FOAM Apply topically.    Glory Rosebush DELICA LANCETS 25D MISC Use to test sugars 2 times weekly 30 each 11  . ranitidine (ZANTAC) 150 MG tablet Take 150 mg by mouth 2 (two) times daily as needed.     [name] spironolactone (ALDACTONE) 25 MG tablet Take 1 tablet (25 mg total) by mouth daily. 30 tablet 6  . SYNTHROID 50 MCG tablet Take 50 mcg by mouth daily before breakfast.    . vitamin B-12 (CYANOCOBALAMIN) 500 MCG tablet Take 500 mcg by mouth daily.    . vitamin E 400 UNIT capsule Take 400 Units by mouth daily.    . Zinc 50 MG TABS Take 1 tablet by mouth daily.     No facility-administered medications prior to visit.    ROS Review of Systems  Constitutional: Negative for fever, chills, diaphoresis and fatigue.  Respiratory: Negative for chest tightness, shortness of breath and wheezing.   Cardiovascular: Negative for chest pain, palpitations and leg swelling.  Gastrointestinal: Negative for nausea, vomiting and diarrhea.  Endocrine: Negative for polydipsia, polyphagia and polyuria.  Skin: Negative for rash.  Neurological: Negative for dizziness, weakness, numbness and headaches.  Psychiatric/Behavioral: The patient is not nervous/anxious.     Objective:  BP 102/82 mmHg  Pulse 68  Temp(Src) 98.6 F (37 C)  Resp 14  Ht 5\' 2"  (1.575 m)  Wt 186 lb 6.4 oz (84.55 kg)  BMI 34.08 kg/m2  SpO2 95%  Physical Exam  Constitutional: She is oriented to person, place, and time. She appears well-developed and well-nourished. No distress.  HENT:  Head: Normocephalic and atraumatic.  Right Ear: External ear normal.  Left Ear: External ear normal.  Nose: Nose normal.  Mouth/Throat: Oropharynx is clear and moist. No oropharyngeal exudate.  TMs and canals  clear on left Right TM blocked by Cerumen  Eyes: Conjunctivae and EOM are normal. Pupils are equal, round, and reactive to light. Right eye exhibits no discharge. Left eye exhibits no discharge. No scleral icterus.  Neck: Normal range of motion. Neck supple. No thyromegaly present.  Cardiovascular: Normal rate, regular rhythm, normal heart sounds and intact distal pulses.  Exam reveals no gallop and no friction rub.   No murmur heard. Pulmonary/Chest: Effort normal and breath sounds normal. No respiratory distress. She has no wheezes. She has no rales. She exhibits no tenderness.  Clinical breast exam deferred to Ob/GYN  Abdominal: Soft. Bowel sounds are normal. She exhibits no distension and no mass. There is no tenderness. There is no rebound and no guarding.  Genitourinary:  OB/GYN  Musculoskeletal: Normal range of motion. She exhibits no edema or tenderness.  Lymphadenopathy:    She has no cervical adenopathy.  Neurological: She is alert and oriented to person, place, and time. She has normal reflexes. No cranial nerve deficit. She exhibits normal muscle tone. Coordination normal.  Skin: Skin is warm and dry. No rash noted. She is not diaphoretic. No erythema. No pallor.  Psychiatric: She has a normal mood and affect. Her behavior is normal. Judgment and thought content normal.   Assessment & Plan:   [name] was seen today for annual exam.  Diagnoses and all orders for this visit:  Routine general medical examination at a health care facility -     Comprehensive metabolic panel -     Lipid panel -     CBC with Differential/Platelet   I am having [name] maintain her loratadine, ranitidine, vitamin B-12, acetaminophen, vitamin C, vitamin E, Zinc, Magnesium, CoQ10, Minoxidil, Vitamin D, Biotin, metFORMIN, ibuprofen, ONETOUCH DELICA LANCETS 87F, glucose blood, spironolactone, SYNTHROID, and BYSTOLIC.  No orders of the defined types were placed in this encounter.     Follow-up:  Return in about 6 months (around 01/25/2016) for follow up.

## 2015-07-28 NOTE — Progress Notes (Signed)
Pre visit review using our clinic review tool, if applicable. No additional management support is needed unless otherwise documented below in the visit note. 

## 2015-07-28 NOTE — Assessment & Plan Note (Signed)
Discussed acute and chronic issues. Reviewed health maintenance measures, PFSHx, and immunizations. Obtain routine labs Lipid panel, CBC w/ diff, and CMET.   Mammogram, PAP, Clinical Breast exam UTD  Will get flu vaccine and prevnar today.

## 2015-07-28 NOTE — Patient Instructions (Signed)

## 2015-07-28 NOTE — Addendum Note (Signed)
Addended by: Rubbie Battiest on: 07/28/2015 10:47 AM   Modules accepted: Miquel Dunn

## 2015-08-02 ENCOUNTER — Other Ambulatory Visit: Payer: Self-pay | Admitting: Nurse Practitioner

## 2015-08-15 ENCOUNTER — Ambulatory Visit (INDEPENDENT_AMBULATORY_CARE_PROVIDER_SITE_OTHER): Payer: Medicare PPO

## 2015-08-15 ENCOUNTER — Other Ambulatory Visit: Payer: Medicare PPO

## 2015-08-15 DIAGNOSIS — Z418 Encounter for other procedures for purposes other than remedying health state: Secondary | ICD-10-CM

## 2015-08-15 DIAGNOSIS — Z23 Encounter for immunization: Secondary | ICD-10-CM | POA: Diagnosis not present

## 2015-08-15 DIAGNOSIS — Z299 Encounter for prophylactic measures, unspecified: Secondary | ICD-10-CM

## 2015-08-21 ENCOUNTER — Other Ambulatory Visit: Payer: Medicare PPO

## 2015-08-23 ENCOUNTER — Other Ambulatory Visit (INDEPENDENT_AMBULATORY_CARE_PROVIDER_SITE_OTHER): Payer: Medicare PPO

## 2015-08-23 DIAGNOSIS — I10 Essential (primary) hypertension: Secondary | ICD-10-CM

## 2015-08-23 LAB — CBC WITH DIFFERENTIAL/PLATELET
Basophils Absolute: 0 10*3/uL (ref 0.0–0.1)
Basophils Relative: 0.4 % (ref 0.0–3.0)
Eosinophils Absolute: 0.2 10*3/uL (ref 0.0–0.7)
Eosinophils Relative: 2.4 % (ref 0.0–5.0)
HEMATOCRIT: 48 % — AB (ref 36.0–46.0)
Hemoglobin: 15.4 g/dL — ABNORMAL HIGH (ref 12.0–15.0)
LYMPHS PCT: 34.6 % (ref 12.0–46.0)
Lymphs Abs: 2.5 10*3/uL (ref 0.7–4.0)
MCHC: 32.2 g/dL (ref 30.0–36.0)
MCV: 79.4 fl (ref 78.0–100.0)
MONOS PCT: 7.3 % (ref 3.0–12.0)
Monocytes Absolute: 0.5 10*3/uL (ref 0.1–1.0)
NEUTROS ABS: 4 10*3/uL (ref 1.4–7.7)
NEUTROS PCT: 55.3 % (ref 43.0–77.0)
PLATELETS: 241 10*3/uL (ref 150.0–400.0)
RBC: 6.05 Mil/uL — ABNORMAL HIGH (ref 3.87–5.11)
RDW: 14.5 % (ref 11.5–15.5)
WBC: 7.2 10*3/uL (ref 4.0–10.5)

## 2015-08-23 LAB — COMPREHENSIVE METABOLIC PANEL
ALBUMIN: 4.3 g/dL (ref 3.5–5.2)
ALK PHOS: 65 U/L (ref 39–117)
ALT: 23 U/L (ref 0–35)
AST: 19 U/L (ref 0–37)
BILIRUBIN TOTAL: 0.5 mg/dL (ref 0.2–1.2)
BUN: 16 mg/dL (ref 6–23)
CO2: 29 mEq/L (ref 19–32)
CREATININE: 1 mg/dL (ref 0.40–1.20)
Calcium: 9.8 mg/dL (ref 8.4–10.5)
Chloride: 102 mEq/L (ref 96–112)
GFR: 59.06 mL/min — AB (ref >60.00)
Glucose, Bld: 100 mg/dL — ABNORMAL HIGH (ref 70–99)
Potassium: 4.5 mEq/L (ref 3.5–5.1)
Sodium: 137 mEq/L (ref 135–145)
Total Protein: 7.1 g/dL (ref 6.0–8.3)

## 2015-08-23 LAB — LIPID PANEL
Cholesterol: 212 mg/dL — ABNORMAL HIGH (ref 0–200)
HDL: 48.5 mg/dL (ref >39.00)
LDL Cholesterol: 139 mg/dL — ABNORMAL HIGH (ref 0–99)
NonHDL: 163.83
Total CHOL/HDL Ratio: 4
Triglycerides: 124 mg/dL (ref 0.0–149.0)
VLDL: 24.8 mg/dL (ref 0.0–40.0)

## 2015-09-03 ENCOUNTER — Other Ambulatory Visit: Payer: Self-pay | Admitting: Nurse Practitioner

## 2015-09-14 ENCOUNTER — Other Ambulatory Visit: Payer: Self-pay | Admitting: Internal Medicine

## 2015-11-27 ENCOUNTER — Other Ambulatory Visit: Payer: Self-pay | Admitting: Nurse Practitioner

## 2015-12-06 ENCOUNTER — Other Ambulatory Visit: Payer: Self-pay | Admitting: Internal Medicine

## 2015-12-24 ENCOUNTER — Other Ambulatory Visit: Payer: Self-pay | Admitting: Nurse Practitioner

## 2015-12-25 ENCOUNTER — Other Ambulatory Visit: Payer: Self-pay | Admitting: Internal Medicine

## 2016-01-03 ENCOUNTER — Other Ambulatory Visit: Payer: Self-pay | Admitting: Internal Medicine

## 2016-01-03 NOTE — Telephone Encounter (Signed)
Pt called and said that [facility] has messed up her prescription and gave her the wrong prescription for metformin and wants to make sure the right one gets sent in.

## 2016-01-04 NOTE — Telephone Encounter (Signed)
Refill correct.

## 2016-01-22 ENCOUNTER — Encounter: Payer: Self-pay | Admitting: Nurse Practitioner

## 2016-01-22 ENCOUNTER — Ambulatory Visit (INDEPENDENT_AMBULATORY_CARE_PROVIDER_SITE_OTHER): Payer: Medicare Other | Admitting: Nurse Practitioner

## 2016-01-22 VITALS — BP 104/62 | HR 62 | Temp 98.0°F | Resp 14 | Ht 62.0 in | Wt 187.0 lb

## 2016-01-22 DIAGNOSIS — E282 Polycystic ovarian syndrome: Secondary | ICD-10-CM

## 2016-01-22 DIAGNOSIS — I10 Essential (primary) hypertension: Secondary | ICD-10-CM | POA: Diagnosis not present

## 2016-01-22 DIAGNOSIS — R7303 Prediabetes: Secondary | ICD-10-CM

## 2016-01-22 DIAGNOSIS — E039 Hypothyroidism, unspecified: Secondary | ICD-10-CM

## 2016-01-22 DIAGNOSIS — M8580 Other specified disorders of bone density and structure, unspecified site: Secondary | ICD-10-CM | POA: Diagnosis not present

## 2016-01-22 DIAGNOSIS — E785 Hyperlipidemia, unspecified: Secondary | ICD-10-CM

## 2016-01-22 DIAGNOSIS — E281 Androgen excess: Secondary | ICD-10-CM

## 2016-01-22 NOTE — Progress Notes (Signed)
mPatient ID: Cynthia Roberson, female    DOB: [birth year]  Age: 65 y.o. MRN: [id number]  CC: Follow-up   HPI Cynthia Roberson presents for 6 month follow up.   1) Pt reports she has been on Medicare for years and does not need welcome to medicare visit. Unsure of why this was set up for that on the schedule.   Not exercising or eating healthy due to lots of stress recently  Daughter is studying radiography and has been running on the weekends with her grandson (38 yo).   Wants to start back at the gym with silver sneakers.   2) Thursday- right nipple saw a small white bump that does not itch, drain, or change.  Dr. Ronita Hipps in May  Mammogram in May   Cynthia Roberson has a past medical history of Other alopecia; Hypertension; Hirsutism; Hyperandrogenemia; Unspecified hypothyroidism; Osteopenia; and Polycystic ovaries.   She has past surgical history that includes Tonsillectomy and Wisdom tooth extraction.   Her family history includes Heart disease in her father and mother; Hypertension in her father; Stroke in her father.She reports that she has never smoked. She does not have any smokeless tobacco history on file. She reports that she drinks about 0.6 oz of alcohol per week. She reports that she does not use illicit drugs.  Outpatient Prescriptions Prior to Visit  Medication Sig Dispense Refill  . acetaminophen (TYLENOL) 500 MG tablet Take 500 mg by mouth every 6 (six) hours as needed.    . Ascorbic Acid (VITAMIN C) 1000 MG tablet Take 1,000 mg by mouth daily.    . Biotin 5 MG TABS Take by mouth.    . Cholecalciferol (VITAMIN D) 2000 UNITS tablet Take 2,000 Units by mouth daily.    . Coenzyme Q10 (COQ10) 100 MG CAPS Take 1 capsule by mouth daily.    [name] glucose blood (ONETOUCH VERIO) test strip Use to test sugars two times weekly 30 each 12  . ibuprofen (ADVIL,MOTRIN) 200 MG tablet Take 200 mg by mouth every 6 (six) hours as needed. 1-2 prn    . loratadine (CLARITIN) 10 MG tablet Take 10 mg  by mouth daily as needed.     . Magnesium 250 MG TABS Take 1 tablet by mouth daily.    . metFORMIN (GLUCOPHAGE-XR) 500 MG 24 hr tablet TAKE 1 TABLET(500 MG) BY MOUTH TWICE DAILY 60 tablet 1  . Minoxidil (ROGAINE MENS) 5 % FOAM Apply topically.    Glory Rosebush DELICA LANCETS 40H MISC Use to test sugars 2 times weekly 30 each 11  . ranitidine (ZANTAC) 150 MG tablet Take 150 mg by mouth 2 (two) times daily as needed.     . vitamin B-12 (CYANOCOBALAMIN) 500 MCG tablet Take 500 mcg by mouth daily.    . vitamin E 400 UNIT capsule Take 400 Units by mouth daily.    . Zinc 50 MG TABS Take 1 tablet by mouth daily.    [name] BYSTOLIC 5 MG tablet TAKE 1 TABLET(5 MG) BY MOUTH DAILY 30 tablet 0  . levothyroxine (SYNTHROID, LEVOTHROID) 50 MCG tablet TAKE 1 TABLET(50 MCG) BY MOUTH DAILY BEFORE BREAKFAST 30 tablet 1  . spironolactone (ALDACTONE) 25 MG tablet TAKE 1 TABLET(25 MG) BY MOUTH DAILY 30 tablet 0  . SYNTHROID 50 MCG tablet Take 50 mcg by mouth daily before breakfast.     No facility-administered medications prior to visit.    ROS Review of Systems  Constitutional: Negative for fever, chills, diaphoresis and fatigue.  Respiratory: Negative for  chest tightness, shortness of breath and wheezing.   Cardiovascular: Negative for chest pain, palpitations and leg swelling.  Gastrointestinal: Negative for nausea, vomiting and diarrhea.  Skin: Negative for rash.  Neurological: Negative for dizziness, weakness, numbness and headaches.  Psychiatric/Behavioral: The patient is not nervous/anxious.     Objective:  BP 104/62 mmHg  Pulse 62  Temp(Src) 98 F (36.7 C) (Oral)  Resp 14  Ht _0  (1.575 m)  Wt 187 lb (84.823 kg)  BMI 34.19 kg/m2  SpO2 96%  Physical Exam  Constitutional: She is oriented to person, place, and time. She appears well-developed and well-nourished. No distress.  HENT:  Head: Normocephalic and atraumatic.  Right Ear: External ear normal.  Left Ear: External ear normal.    Cardiovascular: Normal rate, regular rhythm and normal heart sounds.  Exam reveals no gallop and no friction rub.   No murmur heard. Pulmonary/Chest: Effort normal and breath sounds normal. No respiratory distress. She has no wheezes. She has no rales. She exhibits no tenderness.    Looks like an irritated montgomery gland  Neurological: She is alert and oriented to person, place, and time. No cranial nerve deficit. She exhibits normal muscle tone. Coordination normal.  Skin: Skin is warm and dry. No rash noted. She is not diaphoretic.  Psychiatric: She has a normal mood and affect. Her behavior is normal. Judgment and thought content normal.   Assessment & Plan:   Cynthia Roberson was seen today for follow-up.  Diagnoses and all orders for this visit:  Essential hypertension, benign -     CBC with Differential/Platelet; Future -     Comp Met (CMET); Future -     HgB A1c; Future -     TSH; Future -     T4, free; Future -     Vitamin D (25 hydroxy); Future  Hypothyroidism, unspecified hypothyroidism type -     CBC with Differential/Platelet; Future -     Comp Met (CMET); Future -     HgB A1c; Future -     TSH; Future -     T4, free; Future -     Vitamin D (25 hydroxy); Future  PCOS (polycystic ovarian syndrome) -     CBC with Differential/Platelet; Future -     Comp Met (CMET); Future -     HgB A1c; Future -     TSH; Future -     T4, free; Future -     Vitamin D (25 hydroxy); Future  Osteopenia -     CBC with Differential/Platelet; Future -     Comp Met (CMET); Future -     HgB A1c; Future -     TSH; Future -     T4, free; Future -     Vitamin D (25 hydroxy); Future  Prediabetes -     CBC with Differential/Platelet; Future -     Comp Met (CMET); Future -     HgB A1c; Future -     TSH; Future -     T4, free; Future -     Vitamin D (25 hydroxy); Future  Hyperandrogenemia -     CBC with Differential/Platelet; Future -     Comp Met (CMET); Future -     HgB A1c;  Future -     TSH; Future -     T4, free; Future -     Vitamin D (25 hydroxy); Future  Hyperlipidemia -     Lipid Profile; Future  Other orders -  nebivolol (BYSTOLIC) 5 MG tablet; TAKE 1 TABLET(5 MG) BY MOUTH DAILY   I have discontinued Ms. Pruss's SYNTHROID. I have also changed her BYSTOLIC to nebivolol. Additionally, I am having her maintain her loratadine, ranitidine, vitamin B-12, acetaminophen, vitamin C, vitamin E, Zinc, Magnesium, CoQ10, Minoxidil, Vitamin D, Biotin, ibuprofen, ONETOUCH DELICA LANCETS 26V, glucose blood, and metFORMIN.  Meds ordered this encounter  Medications  . DISCONTD: metFORMIN (GLUCOPHAGE) 500 MG tablet    Sig:     Refill:  2  . nebivolol (BYSTOLIC) 5 MG tablet    Sig: TAKE 1 TABLET(5 MG) BY MOUTH DAILY    Dispense:  90 tablet    Refill:  3    Order Specific Question:  Supervising Provider    Answer:  Crecencio Mc [[id number]]     Follow-up: Return in about 6 months (around 07/24/2016) for Follow up.

## 2016-01-22 NOTE — Patient Instructions (Addendum)
Please come for fasting labs after 02/21/16.   See you in 6 months (happy summer!)

## 2016-01-23 ENCOUNTER — Encounter: Payer: Self-pay | Admitting: Internal Medicine

## 2016-01-23 ENCOUNTER — Ambulatory Visit (INDEPENDENT_AMBULATORY_CARE_PROVIDER_SITE_OTHER): Payer: Medicare Other | Admitting: Internal Medicine

## 2016-01-23 DIAGNOSIS — E281 Androgen excess: Secondary | ICD-10-CM

## 2016-01-23 DIAGNOSIS — M8580 Other specified disorders of bone density and structure, unspecified site: Secondary | ICD-10-CM

## 2016-01-23 DIAGNOSIS — E282 Polycystic ovarian syndrome: Secondary | ICD-10-CM

## 2016-01-23 DIAGNOSIS — I10 Essential (primary) hypertension: Secondary | ICD-10-CM

## 2016-01-23 DIAGNOSIS — E039 Hypothyroidism, unspecified: Secondary | ICD-10-CM | POA: Diagnosis not present

## 2016-01-23 DIAGNOSIS — R7303 Prediabetes: Secondary | ICD-10-CM

## 2016-01-23 LAB — COMPREHENSIVE METABOLIC PANEL
ALBUMIN: 4.4 g/dL (ref 3.5–5.2)
ALT: 24 U/L (ref 0–35)
AST: 21 U/L (ref 0–37)
Alkaline Phosphatase: 59 U/L (ref 39–117)
BILIRUBIN TOTAL: 0.5 mg/dL (ref 0.2–1.2)
BUN: 17 mg/dL (ref 6–23)
CALCIUM: 10 mg/dL (ref 8.4–10.5)
CO2: 28 mEq/L (ref 19–32)
CREATININE: 1.03 mg/dL (ref 0.40–1.20)
Chloride: 104 mEq/L (ref 96–112)
GFR: 57.01 mL/min — ABNORMAL LOW (ref >60.00)
Glucose, Bld: 105 mg/dL — ABNORMAL HIGH (ref 70–99)
Potassium: 5.1 mEq/L (ref 3.5–5.1)
SODIUM: 139 meq/L (ref 135–145)
Total Protein: 7.2 g/dL (ref 6.0–8.3)

## 2016-01-23 LAB — TSH: TSH: 2.02 u[IU]/mL (ref 0.35–4.50)

## 2016-01-23 LAB — HEMOGLOBIN A1C: HEMOGLOBIN A1C: 6.1 % (ref 4.6–6.5)

## 2016-01-23 LAB — T4, FREE: Free T4: 0.99 ng/dL (ref 0.60–1.60)

## 2016-01-23 MED ORDER — SPIRONOLACTONE 25 MG PO TABS: ORAL_TABLET | ORAL | Status: DC

## 2016-01-23 MED ORDER — NEBIVOLOL HCL 5 MG PO TABS: ORAL_TABLET | ORAL | Status: DC

## 2016-01-23 NOTE — Assessment & Plan Note (Signed)
Stable currently. Thinking about stopping the spironolactone- will discuss with her endocrinologist tomorrow.

## 2016-01-23 NOTE — Assessment & Plan Note (Signed)
BP Readings from Last 3 Encounters:  01/23/16 110/60  01/22/16 104/62  07/28/15 102/82   Stable currently. Refilled Bystolic

## 2016-01-23 NOTE — Assessment & Plan Note (Signed)
Need to check a vitamin D.

## 2016-01-23 NOTE — Patient Instructions (Signed)
Please try to increase the Spironolactone to 50 mg daily.  Please come back for a potassium check in 1 week.  Please stop at the lab.  Please come back for a follow-up appointment in 6 months.

## 2016-01-23 NOTE — Assessment & Plan Note (Signed)
Followed by Dr. Cruzita Lederer. No recent changes or concerns. Hair growth has stabilized.

## 2016-01-23 NOTE — Progress Notes (Addendum)
Patient ID: Cynthia Roberson, female   DOB: September 23, 1951, 65 y.o.   MRN: [id number]   HPI  Cynthia Roberson is a 65 y.o.-year-old female, returning for post menopausal hyperandrogenism, hypothyroidism, prediabetes. She has previously been seen by Dr. Howell Rucks, last visit with me 6 months ago.  She feels well, no complaints today.   Reviewed history: Patient had a long history of hyperandrogenism (high testosterone level), and has a history of PCOS + infertility. She could not tolerate OCPs in the past (migraines).   Reviewed the records from Dr. Howell Rucks:  The patient has a history of scalp hair thinning since her younger years, for which she has been using prescription strength, Rogaine with good results. She is using it only once daily and hair thinning has slowed down. She also has noticed increased hair growth over her face, shoulders, chest, abdomen, and change in the texture of these hairs over past several years.  She denies any problem with acne.  She is now postmenopausal since ~ age 14. She had a always had a history of irregular menses and had been on birth control in the past. She had difficulty with conception and had used Clomid. She has had 2 successful pregnancies in the past. Following this, the patient had regular menses for some time until she attained menopause.  Patient denies signs of masculinization.  Testosterone level - elevated at 130 in 2013.  Subsequent levels were still high, however, all of the levels below were drawn while on spironolactone: Component     Latest Ref Rng 07/06/2014 07/14/2014 10/11/2014 04/26/2015  Testosterone     3 - 41 ng/dL 173 (H)  97 (H)   Sex Hormone Binding     18 - 114 nmol/L 31     Testosterone Free     0.0 - 4.2 pg/mL 33.8 (H)  6.1 (H)   Testosterone-% Free     0.4 - 2.4 % 2.0     FSH      26.1     LH      20.3     DHEA-SO4     29.4 - 220.5 ug/dL  77.0    Testosterone, total     7.0 - 40.0 ng/dL    122.7 (H)   - Ruled out for  Cushing's syndrome based on 1 mg dex suppression test- morning cortisol is 1.47 (05/2013) - DHEAS 44.3, Prolactin 5.9 (05/2013) - CT Abdomen and pelvis with contrast (04/2013) - No adrenal mass or any ovarian abnormality - Ovarian US was negative (06/2014) At last visit with Dr.Phadke, the use of flutamide was discussed, as well as the possibility of oophorectomy.  She is also seeing Dr Ronita Hipps [facility]) and Dr Ubaldo Glassing (derm).  The patient has been on spironolactone treatment since 05/2013 and her testosterone levels had improved to the 96 range on recheck. In 2015, the patient had mild elevation in her potassium level, following which dose of spironolactone was reduced from 50 to 25 mg daily.  Latest potassium level: Lab Results  Component Value Date   K 4.5 08/23/2015    Patient is tolerating the medication well, and feels that her hirsutism.  She also has a history of hypothyroidism, well controlled. - Dx 1995 - She is on Synthroid d.a.w. 50 g daily, taken correctly - She is on biotin 5000 g daily - Last thyroid test:  Lab Results  Component Value Date   TSH 1.97 04/26/2015   The patient does not have a history of diabetes >> She  has prediabetes. She did not tolerate  regular metformin in the past.She is on metformin ER 1000 mg with supper.  Last HbA1c:  Lab Results  Component Value Date   HGBA1C 6.0 02/09/2015   She is not checking sugars.  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 Otherwise, unchanged from my initial visit note.  Past Medical History  Diagnosis Date  . Other alopecia   . Hypertension   . Hirsutism   . Hyperandrogenemia   . Unspecified hypothyroidism   . Osteopenia   . Polycystic ovaries    Past Surgical History  Procedure Laterality Date  . Tonsillectomy    . Wisdom tooth extraction     Social History   Social History  . Marital Status: Married    Spouse Name: N/A  . Number of Children: 2   Occupational History  . Retired Pharmacist, hospital, now Counsellor   Social History Main Topics  . Smoking status: Never Smoker   . Smokeless tobacco: Not on file  . Alcohol Use:     1-3 Glasses of wine per year  . Drug Use: No  . Sexual Activity: Yes   Current Outpatient Prescriptions on File Prior to Visit  Medication Sig Dispense Refill  . acetaminophen (TYLENOL) 500 MG tablet Take 500 mg by mouth every 6 (six) hours as needed.    . Ascorbic Acid (VITAMIN C) 1000 MG tablet Take 1,000 mg by mouth daily.    . Biotin 5 MG TABS Take by mouth.    . BYSTOLIC 5 MG tablet TAKE 1 TABLET(5 MG) BY MOUTH DAILY 30 tablet 0  . Cholecalciferol (VITAMIN D) 2000 UNITS tablet Take 2,000 Units by mouth daily.    . Coenzyme Q10 (COQ10) 100 MG CAPS Take 1 capsule by mouth daily.    [name] glucose blood (ONETOUCH VERIO) test strip Use to test sugars two times weekly 30 each 12  . ibuprofen (ADVIL,MOTRIN) 200 MG tablet Take 200 mg by mouth every 6 (six) hours as needed. 1-2 prn    . levothyroxine (SYNTHROID, LEVOTHROID) 50 MCG tablet TAKE 1 TABLET(50 MCG) BY MOUTH DAILY BEFORE BREAKFAST 30 tablet 1  . loratadine (CLARITIN) 10 MG tablet Take 10 mg by mouth daily as needed.     . Magnesium 250 MG TABS Take 1 tablet by mouth daily.    . metFORMIN (GLUCOPHAGE-XR) 500 MG 24 hr tablet TAKE 1 TABLET(500  MG) BY MOUTH TWICE DAILY 60 tablet 1  . Minoxidil (ROGAINE MENS) 5 % FOAM Apply topically.    Glory Rosebush DELICA LANCETS [id number] MISC Use to test sugars 2 times weekly 30 each 11  . ranitidine (ZANTAC) 150 MG tablet Take 150 mg by mouth 2 (two) times daily as needed.     [name] spironolactone (ALDACTONE) 25 MG tablet TAKE 1 TABLET(25 MG) BY MOUTH DAILY 30 tablet 0  . vitamin B-12 (CYANOCOBALAMIN) 500 MCG tablet Take 500 mcg by mouth daily.    . vitamin E 400 UNIT capsule Take 400 Units by mouth daily.    [name]  Zinc 50 MG TABS Take 1 tablet by mouth daily.     No current facility-administered medications on file prior to visit.   Allergies  Allergen Reactions  . Amoxicillin Rash  . Statins Palpitations    Tried Crestor and others  . Sulfa Antibiotics Rash   Family History  Problem Relation Age of Onset  . Heart disease Mother   . Heart disease Father   . Stroke Father   . Hypertension Father    PE: BP 110/60   Pulse 67   Temp 98.9 F (37.2 C) (Oral)   Resp 12   Wt 186 lb (84.4 kg)   SpO2 98%   BMI 34.02 kg/m  Wt Readings from Last 3 Encounters:  07/05/16 183 lb 8 oz (83.2 kg)  01/23/16 186 lb (84.4 kg)  01/22/16 187 lb (84.8 kg)   Constitutional: overweight, in NAD Eyes: PERRLA, EOMI, no exophthalmos ENT: moist mucous membranes, no thyromegaly, no cervical lymphadenopathy Cardiovascular: RRR, No MRG Respiratory: CTA B Gastrointestinal: abdomen soft, NT, ND, BS+ Musculoskeletal: no deformities, strength intact in all 4 Skin: moist, warm, no rashes; dark terminal hair on chin, no acne Neurological: no tremor with outstretched hands, DTR normal in all 4  ASSESSMENT: 1. High testosterone level in a postmenopausal woman  2. Hypothyroidism  3. Prediabetes  PLAN:  1. High testosterone level in a postmenopausal woman  - possibly secondary to insulin resistance or previous undiagnosed PCOS, or can be a new occurrence in the setting of adrenal tumors, ovarian tumors or ovarian  hyperthecosis. The latter 3 conditions usually occur with higher levels of testosterone, > 150 ng/mL. Patient had repeated CT imaging of her adrenals and also a transvaginal ultrasound for evaluation of her ovaries, and these tests were negative for any masses or hyperplasia. Her hormonal testing was negative for hyperprolactinemia, pituitary dysfunction, Cushing syndrome.  - We started spironolactone and she was doing well on this in the past, however, the dose had to be decreased because of hyperkalemia. She is now on the low dose, 25 mg daily. As she is not seeing an effect on her hirsutism, will attempt to increase the dose to 50 mg again >> recheck potassium in 1 week. She is also using Rogaine for her female-pattern baldness. She is also on metformin ER for her prediabetes, and she feels that this helps with her hirsutism.  - She discussed with Dr. Howell Rucks and Dr. Ronita Hipps before about the possibility of performing oophorectomy to look for ovarian hyperthecosis or small hilar ovarian tumors. She would like to hold off with this while being treated with spironolactone - while on spironolactone, testosterone levels are not conclusive, since spironolactone mostly blocks the receptors for testosterone and thus preventing its action, rather than decreasing testosterone levels. For this reason, I will not check her testosterone today. - I would like to check a potassium level today. She has several labs in the system placed by her PCP. These include an HbA1c, TFTs, CMP. - I will see her back in 6 months.  2. Hypothyroidism - This appears to be controlled on her current dose of Synthroid 50 g daily - discussed to take the thyroid hormone every day, with water, at least 30 minutes before breakfast, separated by at least 4 hours from: - acid reflux medications - calcium - iron - multivitamins She is taking this correctly - We'll repeat thyroid tests today  3. Prediabetes - Continue Metformin ER 1000 mg  with dinner - she skipped few doses lately -  will repeat hemoglobin A1c today   Office Visit on 01/23/2016  Component Date Value Ref Range Status  . Sodium 01/23/2016 139  135 - 145 mEq/L Final  . Potassium 01/23/2016 5.1  3.5 - 5.1 mEq/L Final  . Chloride 01/23/2016 104  96 - 112 mEq/L Final  . CO2 01/23/2016 28  19 - 32 mEq/L Final  . Glucose, Bld 01/23/2016 105* 70 - 99 mg/dL Final  . BUN 01/23/2016 17  6 - 23 mg/dL Final  . Creatinine, Ser 01/23/2016 1.03  0.40 - 1.20 mg/dL Final  . Total Bilirubin 01/23/2016 0.5  0.2 - 1.2 mg/dL Final  . Alkaline Phosphatase 01/23/2016 59  39 - 117 U/L Final  . AST 01/23/2016 21  0 - 37 U/L Final  . ALT 01/23/2016 24  0 - 35 U/L Final  . Total Protein 01/23/2016 7.2  6.0 - 8.3 g/dL Final  . Albumin 01/23/2016 4.4  3.5 - 5.2 g/dL Final  . Calcium 01/23/2016 10.0  8.4 - 10.5 mg/dL Final  . GFR 01/23/2016 57.01* >60.00 mL/min Final  . Hgb A1c MFr Bld 01/23/2016 6.1  4.6 - 6.5 % Final   Glycemic Control Guidelines for People with Diabetes:Non Diabetic:  <6%Goal of Therapy: <7%Additional Action Suggested:  >8%   . TSH 01/23/2016 2.02  0.35 - 4.50 uIU/mL Final  . Free T4 01/23/2016 0.99  0.60 - 1.60 ng/dL Final   Labs are at goal, except potassium which is at the upper limit of normal. I therefore advised the patient not to increase the spironolactone for now.

## 2016-01-23 NOTE — Assessment & Plan Note (Signed)
Checking an A1c soon

## 2016-01-24 ENCOUNTER — Encounter: Payer: Self-pay | Admitting: Internal Medicine

## 2016-01-24 MED ORDER — SPIRONOLACTONE 25 MG PO TABS: ORAL_TABLET | ORAL | Status: DC

## 2016-02-01 ENCOUNTER — Other Ambulatory Visit: Payer: Medicare Other

## 2016-02-06 ENCOUNTER — Other Ambulatory Visit: Payer: Self-pay | Admitting: Internal Medicine

## 2016-03-17 ENCOUNTER — Other Ambulatory Visit: Payer: Self-pay | Admitting: Internal Medicine

## 2016-03-27 ENCOUNTER — Telehealth: Payer: Self-pay | Admitting: Internal Medicine

## 2016-03-27 NOTE — Telephone Encounter (Signed)
Patient ask Dr Cruzita Lederer to recommend her a PCP doctor,please advise

## 2016-03-27 NOTE — Telephone Encounter (Signed)
Please read message below and advise.  

## 2016-05-08 ENCOUNTER — Other Ambulatory Visit: Payer: Self-pay | Admitting: Internal Medicine

## 2016-06-10 ENCOUNTER — Other Ambulatory Visit: Payer: Self-pay | Admitting: Internal Medicine

## 2016-07-05 ENCOUNTER — Encounter: Payer: Self-pay | Admitting: Family Medicine

## 2016-07-05 ENCOUNTER — Ambulatory Visit (INDEPENDENT_AMBULATORY_CARE_PROVIDER_SITE_OTHER): Payer: Medicare Other | Admitting: Family Medicine

## 2016-07-05 ENCOUNTER — Ambulatory Visit (INDEPENDENT_AMBULATORY_CARE_PROVIDER_SITE_OTHER)
Admission: RE | Admit: 2016-07-05 | Discharge: 2016-07-05 | Disposition: A | Discharge status: Home / Self Care | Payer: Medicare Other | Source: Ambulatory Visit | Attending: Family Medicine | Admitting: Family Medicine

## 2016-07-05 VITALS — BP 122/78 | HR 68 | Temp 98.0°F | Wt 183.5 lb

## 2016-07-05 DIAGNOSIS — E282 Polycystic ovarian syndrome: Secondary | ICD-10-CM | POA: Diagnosis not present

## 2016-07-05 DIAGNOSIS — R05 Cough: Secondary | ICD-10-CM

## 2016-07-05 DIAGNOSIS — Z23 Encounter for immunization: Secondary | ICD-10-CM | POA: Diagnosis not present

## 2016-07-05 DIAGNOSIS — I10 Essential (primary) hypertension: Secondary | ICD-10-CM

## 2016-07-05 DIAGNOSIS — R059 Cough, unspecified: Secondary | ICD-10-CM

## 2016-07-05 DIAGNOSIS — R7303 Prediabetes: Secondary | ICD-10-CM

## 2016-07-05 DIAGNOSIS — E039 Hypothyroidism, unspecified: Secondary | ICD-10-CM

## 2016-07-05 NOTE — Assessment & Plan Note (Signed)
Followed by endo - stable on metformin XR.

## 2016-07-05 NOTE — Patient Instructions (Addendum)
Flu shot today. Xray today.  Try 1/2 bystolic If scored tablets. If not, let me know and we can send in 2.5mg  dose.  Start using ASO ankle braces to prevent sprains.  Good to meet you today, call us with questions. Return in 3-4 months for medicare wellness visit.

## 2016-07-05 NOTE — Progress Notes (Signed)
Pre visit review using our clinic review tool, if applicable. No additional management support is needed unless otherwise documented below in the visit note. 

## 2016-07-05 NOTE — Assessment & Plan Note (Addendum)
Ongoing over last several years - attributes to spironolactone. Last coughing fit was a few days ago. Very intermittent. Wonders about asthma.  Requests CXR - will check today.

## 2016-07-05 NOTE — Progress Notes (Signed)
BP 122/78   Pulse 68   Temp 98 F (36.7 C) (Oral)   Wt 183 lb 8 oz (83.2 kg)   BMI 33.56 kg/m    CC: transfer of care from [place]:    Patient ID: Cynthia Roberson, female    DOB: August 29, 1951, 65 y.o.   MRN: [id number]  HPI: Cynthia Roberson is a 65 y.o. female presenting on 07/05/2016 for [place] (Transfer from [facility])   Prior saw [place], then started seeing [facility].   Followed by Dr Cruzita Lederer for PCOS, hyperandrogenism and hypothyroidism. On brand synthroid. Palpitations with generic levothyroxine. On metformin XR 572m taking once daily. Also on spironolactone 227mdaily for high testosterone.   HTN - Compliant with current antihypertensive regimen of bystolic 51m79maily - wants to come off this medicine. Does check blood pressures at home: well controlled. No low blood pressure readings or symptoms of dizziness/syncope.  Denies HA, vision changes, CP/tightness, SOB, leg swelling.   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   Noticing some memory trouble.  Noticing ongoing dry cough since spironolactone was started. Occasional coughing fits. ?H/o environmental asthma to [place]edar [place]  No dyspnea or wheezing.   R hip pain may be worsening. H/o sciatica issues since she picked up 50 lb computer 15 yrs ago. Biofreeze helps. Has sleep number bed which also helps.  Ongoing ankle pain in h/o recurrent ankle sprains.   Preventative: Received medicare last year. Well woman - sees Dr TaaRonita HippsXA - h/o osteopenia Nonsmoker. + second hand smoke.  Relevant past medical, surgical, family and social history reviewed and updated as indicated. Interim medical history since our last visit reviewed. Allergies and medications reviewed and updated. Current Outpatient Prescriptions on File Prior to Visit  Medication Sig  . acetaminophen (TYLENOL) 500 MG tablet Take 500 mg by mouth every 6 (six) hours as needed.  . Ascorbic Acid (VITAMIN C) 1000 MG tablet Take 1,000 mg by mouth daily.  .  Biotin 5 MG TABS Take 5 mg by mouth daily.   . Cholecalciferol (VITAMIN D) 2000 UNITS tablet Take 4,000 Units by mouth daily.   . Coenzyme Q10 (COQ10) 100 MG CAPS Take 1 capsule by mouth daily.  . gMarland Kitchenucose blood (ONETOUCH VERIO) test strip Use to test sugars two times weekly  . ibuprofen (ADVIL,MOTRIN) 200 MG tablet Take 200 mg by mouth every 6 (six) hours as needed. 1-2 prn  . loratadine (CLARITIN) 10 MG tablet Take 10 mg by mouth daily as needed.   . Magnesium 250 MG TABS Take 1 tablet by mouth daily.  . metFORMIN (GLUCOPHAGE-XR) 500 MG 24 hr tablet TAKE 1 TABLET(500 MG) BY MOUTH TWICE DAILY (Patient taking differently: TAKE 1 TABLET(500 MG) BY MOUTH ONCE DAILY)  . Minoxidil (ROGAINE MENS) 5 % FOAM Apply topically.  . nebivolol (BYSTOLIC) 5 MG tablet TAKE 1 TABLET(5 MG) BY MOUTH DAILY  . ONETOUCH DELICA LANCETS [id number] Use to test sugars 2 times weekly  . ranitidine (ZANTAC) 150 MG tablet Take 150 mg by mouth daily.   . sMarland Kitchenironolactone (ALDACTONE) 25 MG tablet TAKE 1 TABLET (25 MG) BY MOUTH DAILY  . SYNTHROID 50 MCG tablet TAKE 1 TABLET BY MOUTH EVERY DAY BEFROE BREAKFAST (Patient taking differently: No sig reported)  . vitamin B-12 (CYANOCOBALAMIN) 500 MCG tablet Take 500 mcg by mouth daily.  . vitamin E 400 UNIT capsule Take 400 Units by mouth daily.  . Zinc 50 MG TABS Take 1 tablet by mouth daily.   No current facility-administered  medications on file prior to visit.     Review of Systems Per HPI unless specifically indicated in ROS section     Objective:    BP 122/78   Pulse 68   Temp 98 F (36.7 C) (Oral)   Wt 183 lb 8 oz (83.2 kg)   BMI 33.56 kg/m   Wt Readings from Last 3 Encounters:  07/05/16 183 lb 8 oz (83.2 kg)  01/23/16 186 lb (84.4 kg)  01/22/16 187 lb (84.8 kg)    Physical Exam  Constitutional: She appears well-developed and well-nourished. No distress.  HENT:  Mouth/Throat: Oropharynx is clear and moist. No oropharyngeal exudate.  Eyes: Conjunctivae are  normal. Pupils are equal, round, and reactive to light.  Neck: Normal range of motion. Neck supple. No thyromegaly present.  Cardiovascular: Normal rate, regular rhythm, normal heart sounds and intact distal pulses.   No murmur heard. Pulmonary/Chest: Effort normal and breath sounds normal. No respiratory distress. She has no wheezes. She has no rales.  Musculoskeletal: She exhibits no edema.  Lymphadenopathy:    She has no cervical adenopathy.  Skin: Skin is warm and dry. No rash noted.  Psychiatric: She has a normal mood and affect.  Nursing note and vitals reviewed.  Results for orders placed or performed in visit on 01/23/16  Comp Met (CMET)  Result Value Ref Range   Sodium 139 135 - 145 mEq/L   Potassium 5.1 3.5 - 5.1 mEq/L   Chloride 104 96 - 112 mEq/L   CO2 28 19 - 32 mEq/L   Glucose, Bld 105 (H) 70 - 99 mg/dL   BUN 17 6 - 23 mg/dL   Creatinine, Ser 1.03 0.40 - 1.20 mg/dL   Total Bilirubin 0.5 0.2 - 1.2 mg/dL   Alkaline Phosphatase 59 39 - 117 U/L   AST 21 0 - 37 U/L   ALT 24 0 - 35 U/L   Total Protein 7.2 6.0 - 8.3 g/dL   Albumin 4.4 3.5 - 5.2 g/dL   Calcium 10.0 8.4 - 10.5 mg/dL   GFR 57.01 (L) >60.00 mL/min  HgB A1c  Result Value Ref Range   Hgb A1c MFr Bld 6.1 4.6 - 6.5 %  TSH  Result Value Ref Range   TSH 2.02 0.35 - 4.50 uIU/mL  T4, free  Result Value Ref Range   Free T4 0.99 0.60 - 1.60 ng/dL      Assessment & Plan:  Over 45 minutes were spent face-to-face with the patient during this encounter and >50% of that time was spent on counseling and coordination of care   Suggested ASO braces in h/o recurrent ankle sprains. Provided with ankle stretching exercises. Will monitor MSK pains and let me know if recurrent or worsening. Problem List Items Addressed This Visit    Cough    Ongoing over last several years - attributes to spironolactone. Last coughing fit was a few days ago. Very intermittent. Wonders about asthma.  Requests CXR - will check today.         Relevant Orders   DG Chest 2 View   Essential hypertension, benign - Primary    Chronic, stable. Continue current regimen.  Suggested try 1/2 tablet nebivolol.       Hypothyroidism    Chronic, stable. Followed by endocrinology.       PCOS (polycystic ovarian syndrome)    Followed by endocrinology. On metformin 559m XR once daily and spironolactone.       Prediabetes    Followed by  endo - stable on metformin XR.        Other Visit Diagnoses    Need for influenza vaccination       Relevant Orders   Flu Vaccine QUAD 36+ mos PF IM (Fluarix & Fluzone Quad PF) (Completed)       Follow up plan: Return in about 3 months (around 10/04/2016) for medicare wellness visit.  Ria Bush, MD

## 2016-07-05 NOTE — Assessment & Plan Note (Signed)
Chronic, stable. Followed by endocrinology.  

## 2016-07-05 NOTE — Assessment & Plan Note (Addendum)
Followed by endocrinology. On metformin 500mg  XR once daily and spironolactone.

## 2016-07-05 NOTE — Assessment & Plan Note (Signed)
Chronic, stable. Continue current regimen.  Suggested try 1/2 tablet nebivolol.

## 2016-07-08 ENCOUNTER — Telehealth: Payer: Self-pay

## 2016-07-08 MED ORDER — NEBIVOLOL HCL 2.5 MG PO TABS: 2.5000 mg | ORAL_TABLET | Freq: Every day | ORAL | 6 refills | Status: DC

## 2016-07-08 NOTE — Telephone Encounter (Signed)
Pt left v/m; pt was seen 07/05/16 and pt cannot cut triangular tab in half; pt request change med as discussed at appt to bystolic 2.5 mg. Please advise. [facility] [address]

## 2016-07-08 NOTE — Telephone Encounter (Signed)
plz notify bystolic 2.5mg  dose sent to pharmacy.

## 2016-07-08 NOTE — Telephone Encounter (Signed)
Patient notified

## 2016-07-16 LAB — HM DIABETES EYE EXAM

## 2016-07-20 ENCOUNTER — Encounter: Payer: Self-pay | Admitting: Family Medicine

## 2016-07-25 ENCOUNTER — Ambulatory Visit (INDEPENDENT_AMBULATORY_CARE_PROVIDER_SITE_OTHER): Payer: Medicare Other | Admitting: Internal Medicine

## 2016-07-25 ENCOUNTER — Encounter: Payer: Self-pay | Admitting: Internal Medicine

## 2016-07-25 VITALS — BP 123/82 | HR 66 | Ht 62.0 in | Wt 183.0 lb

## 2016-07-25 DIAGNOSIS — R7303 Prediabetes: Secondary | ICD-10-CM

## 2016-07-25 DIAGNOSIS — E281 Androgen excess: Secondary | ICD-10-CM

## 2016-07-25 DIAGNOSIS — E039 Hypothyroidism, unspecified: Secondary | ICD-10-CM | POA: Diagnosis not present

## 2016-07-25 LAB — TSH: TSH: 2.14 u[IU]/mL (ref 0.35–4.50)

## 2016-07-25 LAB — HEMOGLOBIN A1C: Hgb A1c MFr Bld: 5.9 % (ref 4.6–6.5)

## 2016-07-25 LAB — T4, FREE: Free T4: 1.06 ng/dL (ref 0.60–1.60)

## 2016-07-25 LAB — POTASSIUM: Potassium: 4.8 mEq/L (ref 3.5–5.1)

## 2016-07-25 NOTE — Progress Notes (Signed)
Patient ID: Cynthia Roberson, female   DOB: 1951/08/14, 65 y.o.   MRN: [id number]   HPI  Cynthia Roberson is a 65 y.o.-year-old female, returning for post menopausal hyperandrogenism, hypothyroidism, prediabetes. She has previously been seen by Dr. Howell Rucks, last visit with me 6 months ago.  She feels well, no complaints today.   Reviewed history: Patient had a long history of hyperandrogenism (high testosterone level), and has a history of PCOS + infertility. She could not tolerate OCPs in the past (migraines).   Reviewed the records from Dr. Howell Rucks:  The patient has a history of scalp hair thinning since her younger years, for which she has been using prescription strength, Rogaine with good results. She is using it only once daily and hair thinning has slowed down. She also has noticed increased hair growth over her face, shoulders, chest, abdomen, and change in the texture of these hairs over past several years.  No acne.  She is now postmenopausal since ~ age 69. She had a always had a history of irregular menses and had been on birth control in the past. She had difficulty with conception and had used Clomid. She has had 2 successful pregnancies in the past. Following this, the patient had regular menses for some time until she attained menopause.  Patient denies signs of masculinization.  Testosterone level - elevated at 130 in 2013.  Subsequent levels were still high, however, all of the levels below were drawn while on spironolactone. We stopped checking her testosterone levels after 03/2015, as we continue spironolactone. Component     Latest Ref Rng 07/06/2014 07/14/2014 10/11/2014 04/26/2015  Testosterone     3 - 41 ng/dL 173 (H)  97 (H)   Sex Hormone Binding     18 - 114 nmol/L 31     Testosterone Free     0.0 - 4.2 pg/mL 33.8 (H)  6.1 (H)   Testosterone-% Free     0.4 - 2.4 % 2.0     FSH      26.1     LH      20.3     DHEA-SO4     29.4 - 220.5 ug/dL  77.0    Testosterone,  total     7.0 - 40.0 ng/dL    122.7 (H)   Other previous workup: - Ruled out for Cushing's syndrome based on 1 mg dex suppression test- morning cortisol is 1.47 (05/2013) - DHEAS 44.3, Prolactin 5.9 (05/2013) - CT Abdomen and pelvis with contrast (04/2013) - No adrenal mass or any ovarian abnormality - Ovarian US was negative (06/2014) At last visit with Dr.Phadke, the use of flutamide was discussed, as well as the possibility of oophorectomy.  She is also seeing Dr Ronita Hipps [facility]) and Dr Ubaldo Glassing (derm).  The patient has been on spironolactone treatment since 05/2013. In 2015, the patient had mild elevation in her potassium level, following which dose of spironolactone was reduced from 50 to 25 mg daily. Latest potassium level was at the ULN >> we could not increase the Spironolactone dose: Lab Results  Component Value Date   K 5.1 01/23/2016    Patient is tolerating the medication well, But does not appreciate a great improvement in her hirsutism on spironolactone. She is, however, taken minoxidil which helps.  She also has a history of hypothyroidism, well controlled. - Dx 1995 - She is on Synthroid d.a.w. 50 g daily, taken correctly - She is on biotin 5000 g daily - not recently. - Last  thyroid test:  Lab Results  Component Value Date   TSH 2.02 01/23/2016   She has prediabetes.  - She did not tolerate  regular metformin in the past >> She is on metformin ER 1000 mg at lunchtime..  Last HbA1c:  Lab Results  Component Value Date   HGBA1C 6.1 01/23/2016   She checked a few sugars since last visit, and they are at goal: 89-105 in a.m.  Last eye exam: Abstract on 07/20/2016  Component Date Value Ref Range Status  . 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 Otherwise, unchanged from my initial visit note. She decreased the Bystolic dose.  Past Medical History:  Diagnosis Date  . Hirsutism   . Hyperandrogenemia   . Hypertension   . Osteopenia   . Other alopecia   . Polycystic ovaries   . Unspecified hypothyroidism    Past Surgical History:  Procedure Laterality Date  . TONSILLECTOMY    . WISDOM TOOTH EXTRACTION     Social History   Social History  . Marital Status: Married    Spouse Name: N/A  . Number of Children: 2   Occupational History  . Retired Pharmacist, hospital, now Counsellor   Social History Main Topics  . Smoking status: Never Smoker   . Smokeless tobacco: Not on file  . Alcohol Use:     1-3 Glasses of wine per year  . Drug Use: No  . Sexual Activity: Yes   Current Outpatient Prescriptions on File Prior to Visit  Medication Sig Dispense Refill  . acetaminophen (TYLENOL) 500 MG tablet Take 500 mg by mouth every 6 (six) hours as needed.    . Ascorbic Acid (VITAMIN C) 1000 MG tablet Take 1,000 mg by mouth daily.    . Biotin 5 MG TABS Take 5 mg by mouth daily.     . Cholecalciferol (VITAMIN D) 2000 UNITS tablet Take 4,000 Units by mouth daily.     . Coenzyme Q10 (COQ10) 100 MG CAPS Take 1 capsule by mouth daily.    [name] glucose blood (ONETOUCH VERIO) test strip Use to test sugars two times weekly 30 each 12  . ibuprofen (ADVIL,MOTRIN) 200 MG tablet Take 200 mg by mouth every 6 (six) hours as needed. 1-2 prn    . loratadine (CLARITIN) 10  MG tablet Take 10 mg by mouth daily as needed.     . Magnesium 250 MG TABS Take 1 tablet by mouth daily.    . metFORMIN (GLUCOPHAGE-XR) 500 MG 24 hr tablet TAKE 1 TABLET(500 MG) BY MOUTH TWICE DAILY (Patient taking differently: TAKE 1 TABLET(500 MG) BY MOUTH ONCE DAILY) 180 tablet 0  . Minoxidil (ROGAINE MENS) 5 % FOAM Apply topically.    . nebivolol (BYSTOLIC) 2.5 MG tablet Take 1 tablet (2.5 mg total) by mouth daily. 30 tablet 6  . ONETOUCH DELICA LANCETS 99991111 MISC Use to test sugars 2 times weekly 30 each 11  . ranitidine (ZANTAC) 150 MG tablet Take  150 mg by mouth daily.     [name] spironolactone (ALDACTONE) 25 MG tablet TAKE 1 TABLET (25 MG) BY MOUTH DAILY 180 tablet 3  . SYNTHROID 50 MCG tablet TAKE 1 TABLET BY MOUTH EVERY DAY BEFROE BREAKFAST (Patient taking differently: No sig reported) 30 tablet 3  . Turmeric Curcumin 500 MG CAPS Take 1 capsule by mouth daily.    . vitamin B-12 (CYANOCOBALAMIN) 500 MCG tablet Take 500 mcg by mouth daily.    . vitamin E 400 UNIT capsule Take 400 Units by mouth daily.    . Zinc 50 MG TABS Take 1 tablet by mouth daily.     No current facility-administered medications on file prior to visit.    Allergies  Allergen Reactions  . Amoxicillin Rash  . Levothyroxine Palpitations    With generic levothyroxine, tolerates well the brand name.  . Statins Palpitations    Tried Crestor and others  . Sulfa Antibiotics Rash   Family History  Problem Relation Age of Onset  . Heart failure Mother   . Cancer Mother 57    lung  . Heart disease Father   . Stroke Father   . Hypertension Father    PE: BP 123/82   Pulse 66   Ht 5\' 2"  (1.575 m)   Wt 183 lb (83 kg)   BMI 33.47 kg/m  Wt Readings from Last 3 Encounters:  07/25/16 183 lb (83 kg)  07/05/16 183 lb 8 oz (83.2 kg)  01/23/16 186 lb (84.4 kg)   Constitutional: overweight, in NAD Eyes: PERRLA, EOMI, no exophthalmos ENT: moist mucous membranes, no thyromegaly, no cervical lymphadenopathy Cardiovascular:  RRR, No MRG Respiratory: CTA B Gastrointestinal: abdomen soft, NT, ND, BS+ Musculoskeletal: no deformities, strength intact in all 4 Skin: moist, warm, no rashes; dark terminal hair on chin, no acne Neurological: no tremor with outstretched hands, DTR normal in all 4  ASSESSMENT: 1. High testosterone level in a postmenopausal woman  2. Hypothyroidism  3. Prediabetes  PLAN:  1. High testosterone level in a postmenopausal woman  - possibly secondary to insulin resistance or previous undiagnosed PCOS, or can be a new occurrence in the setting of adrenal tumors, ovarian tumors or ovarian hyperthecosis. The latter 3 conditions usually occur with higher levels of testosterone, > 150 ng/mL. Patient had repeated CT imaging of her adrenals and also a transvaginal ultrasound for evaluation of her ovaries, and these tests were negative for any masses or hyperplasia. Her hormonal testing was negative for hyperprolactinemia, pituitary dysfunction, Cushing syndrome.  - Patient continued on spironolactone however, we cannot increase this beyond 25 mg daily because of hyperkalemia. Last potassium was at the upper limit of normal, 5.1. We'll recheck this today. I would not suggest increasing the dose of spironolactone, though. We will continue the patient on it her blood pressure has also improved while on this. She was actually able to reduce the Bystolic and she is intending to come off the med. She is also using Rogaine for her female-pattern baldness. She is also on metformin ER for her prediabetes, and she feels that this helps with her hirsutism.  - She discussed with Dr. Howell Rucks and Dr. Ronita Hipps before about the possibility of performing oophorectomy to look for ovarian hyperthecosis or small hilar ovarian tumors. Per her request, we'll continue with the above regimen. - while on spironolactone, testosterone levels are not conclusive, since spironolactone mostly blocks the receptors for testosterone and thus  preventing its action, rather than decreasing testosterone levels. For this  reason, I will not check her testosterone today. - I will see her back in 6 months.  2. Hypothyroidism - This appears to be controlled on her current dose of Synthroid 50 g daily - discussed to take the thyroid hormone every day, with water, at least 30 minutes before breakfast, separated by at least 4 hours from: - acid reflux medications - calcium - iron - multivitamins She is taking this correctly - We'll repeat thyroid tests today  3. Prediabetes - Continue Metformin ER 1000 mg with lunch - will repeat hemoglobin A1c today  Office Visit on 07/25/2016  Component Date Value Ref Range Status  . TSH 07/25/2016 2.14  0.35 - 4.50 uIU/mL Final  . Free T4 07/25/2016 1.06  0.60 - 1.60 ng/dL Final  . Hgb A1c MFr Bld 07/25/2016 5.9  4.6 - 6.5 % Final  . Potassium 07/25/2016 4.8  3.5 - 5.1 mEq/L Final   Labs look great >> will cont. Current med doses.  Philemon Kingdom, MD PhD [facility] Endocrinology

## 2016-07-25 NOTE — Patient Instructions (Signed)
Please continue Metformin ER 1000 mg daily at lunchtime.  Please continue Spironolactone 25 mg daily.  Please continue Synthroid 50 mcg daily.  Take the thyroid hormone every day, with water, at least 30 minutes before breakfast, separated by at least 4 hours from: - acid reflux medications - calcium - iron - multivitamins  Please stop at the lab.  Please come back for a follow-up appointment in 6 months.

## 2016-07-30 ENCOUNTER — Other Ambulatory Visit: Payer: Self-pay | Admitting: Internal Medicine

## 2016-07-31 ENCOUNTER — Other Ambulatory Visit: Payer: Self-pay | Admitting: Internal Medicine

## 2016-07-31 NOTE — Telephone Encounter (Signed)
Pharmacy stated they haven't recieved request for themetFORMIN (GLUCOPHAGE-XR) 500 MG 24 hr tablet  Patient is totally out  [place], [place] [address] [phone] (Phone) [phone] (Fax)

## 2016-08-01 ENCOUNTER — Other Ambulatory Visit: Payer: Self-pay

## 2016-08-01 MED ORDER — METFORMIN HCL ER 500 MG PO TB24: ORAL_TABLET | ORAL | 0 refills | Status: DC

## 2016-10-07 ENCOUNTER — Other Ambulatory Visit: Payer: Self-pay | Admitting: Internal Medicine

## 2016-11-03 ENCOUNTER — Other Ambulatory Visit: Payer: Self-pay | Admitting: Internal Medicine

## 2016-11-04 ENCOUNTER — Other Ambulatory Visit: Payer: Self-pay | Admitting: Internal Medicine

## 2016-11-05 ENCOUNTER — Other Ambulatory Visit: Payer: Self-pay | Admitting: Family Medicine

## 2016-11-05 DIAGNOSIS — E559 Vitamin D deficiency, unspecified: Secondary | ICD-10-CM

## 2016-11-05 DIAGNOSIS — E785 Hyperlipidemia, unspecified: Secondary | ICD-10-CM | POA: Insufficient documentation

## 2016-11-05 DIAGNOSIS — D751 Secondary polycythemia: Secondary | ICD-10-CM

## 2016-11-06 ENCOUNTER — Other Ambulatory Visit: Payer: Medicare Other

## 2016-11-06 ENCOUNTER — Ambulatory Visit (INDEPENDENT_AMBULATORY_CARE_PROVIDER_SITE_OTHER): Payer: Medicare Other

## 2016-11-06 VITALS — BP 118/80 | HR 76 | Temp 98.6°F | Ht 62.0 in | Wt 181.0 lb

## 2016-11-06 DIAGNOSIS — D751 Secondary polycythemia: Secondary | ICD-10-CM | POA: Diagnosis not present

## 2016-11-06 DIAGNOSIS — E559 Vitamin D deficiency, unspecified: Secondary | ICD-10-CM

## 2016-11-06 DIAGNOSIS — E785 Hyperlipidemia, unspecified: Secondary | ICD-10-CM | POA: Diagnosis not present

## 2016-11-06 DIAGNOSIS — Z0000 Encounter for general adult medical examination without abnormal findings: Secondary | ICD-10-CM | POA: Diagnosis not present

## 2016-11-06 LAB — BASIC METABOLIC PANEL
BUN: 16 mg/dL (ref 6–23)
CHLORIDE: 102 meq/L (ref 96–112)
CO2: 28 meq/L (ref 19–32)
Calcium: 10.5 mg/dL (ref 8.4–10.5)
Creatinine, Ser: 0.97 mg/dL (ref 0.40–1.20)
GFR: 60.95 mL/min (ref >60.00)
Glucose, Bld: 127 mg/dL — ABNORMAL HIGH (ref 70–99)
POTASSIUM: 5.5 meq/L — AB (ref 3.5–5.1)
Sodium: 137 mEq/L (ref 135–145)

## 2016-11-06 LAB — CBC WITH DIFFERENTIAL/PLATELET
BASOS ABS: 0 10*3/uL (ref 0.0–0.1)
Basophils Relative: 0.5 % (ref 0.0–3.0)
EOS ABS: 0.1 10*3/uL (ref 0.0–0.7)
Eosinophils Relative: 0.9 % (ref 0.0–5.0)
HCT: 48.8 % — ABNORMAL HIGH (ref 36.0–46.0)
Hemoglobin: 16.2 g/dL — ABNORMAL HIGH (ref 12.0–15.0)
LYMPHS ABS: 2.1 10*3/uL (ref 0.7–4.0)
LYMPHS PCT: 30.5 % (ref 12.0–46.0)
MCHC: 33.1 g/dL (ref 30.0–36.0)
MCV: 79.1 fl (ref 78.0–100.0)
MONOS PCT: 6.3 % (ref 3.0–12.0)
Monocytes Absolute: 0.4 10*3/uL (ref 0.1–1.0)
NEUTROS ABS: 4.3 10*3/uL (ref 1.4–7.7)
NEUTROS PCT: 61.8 % (ref 43.0–77.0)
PLATELETS: 282 10*3/uL (ref 150.0–400.0)
RBC: 6.17 Mil/uL — AB (ref 3.87–5.11)
RDW: 14.4 % (ref 11.5–15.5)
WBC: 7 10*3/uL (ref 4.0–10.5)

## 2016-11-06 LAB — LIPID PANEL
CHOL/HDL RATIO: 4
Cholesterol: 236 mg/dL — ABNORMAL HIGH (ref 0–200)
HDL: 54.4 mg/dL (ref >39.00)
LDL CALC: 160 mg/dL — AB (ref 0–99)
NONHDL: 181.17
TRIGLYCERIDES: 108 mg/dL (ref 0.0–149.0)
VLDL: 21.6 mg/dL (ref 0.0–40.0)

## 2016-11-06 LAB — VITAMIN D 25 HYDROXY (VIT D DEFICIENCY, FRACTURES): VITD: 47.85 ng/mL (ref 30.00–100.00)

## 2016-11-06 NOTE — Progress Notes (Signed)
PCP notes:   Health maintenance:  PPSV23 - addressed; pt will complete at CPE; currently on antibiotics  Abnormal screenings:   None  Patient concerns:   None  Nurse concerns:  None  Next PCP appt:   11/11/16 @ 0930

## 2016-11-06 NOTE — Patient Instructions (Signed)
Cynthia Roberson , Thank you for taking time to come for your Medicare Wellness Visit. I appreciate your ongoing commitment to your health goals. Please review the following plan we discussed and let me know if I can assist you in the future.   These are the goals we discussed: Goals    . Exercise 3x per week (30 min per time)    . Increase physical activity          When schedule permits, I will exercise at least 30 min 3 days per week.        This is a list of the screening recommended for you and due dates:  Health Maintenance  Topic Date Due  . Pneumonia vaccines (2 of 2 - PPSV23) 11/27/2016*  . Mammogram  12/26/2016  . DTaP/Tdap/Td vaccine (2 - Td) 04/10/2021  . Tetanus Vaccine  04/10/2021  . Colon Cancer Screening  12/26/2021  . Flu Shot  Completed  . DEXA scan (bone density measurement)  Addressed  . Shingles Vaccine  Completed  .  Hepatitis C: One time screening is recommended by Center for Disease Control  (CDC) for  adults born from [birth year] through 1966.   Addressed  *Topic was postponed. The date shown is not the original due date.   Preventive Care for Adults  A healthy lifestyle and preventive care can promote health and wellness. Preventive health guidelines for adults include the following key practices.  . A routine yearly physical is a good way to check with your health care provider about your health and preventive screening. It is a chance to share any concerns and updates on your health and to receive a thorough exam.  . Visit your dentist for a routine exam and preventive care every 6 months. Brush your teeth twice a day and floss once a day. Good oral hygiene prevents tooth decay and gum disease.  . The frequency of eye exams is based on your age, health, family medical history, use  of contact lenses, and other factors. Follow your health care provider's ecommendations for frequency of eye exams.  . Eat a healthy diet. Foods like vegetables, fruits, whole grains,  low-fat dairy products, and lean protein foods contain the nutrients you need without too many calories. Decrease your intake of foods high in solid fats, added sugars, and salt. Eat the right amount of calories for you. Get information about a proper diet from your health care provider, if necessary.  . Regular physical exercise is one of the most important things you can do for your health. Most adults should get at least 150 minutes of moderate-intensity exercise (any activity that increases your heart rate and causes you to sweat) each week. In addition, most adults need muscle-strengthening exercises on 2 or more days a week.  Silver Sneakers may be a benefit available to you. To determine eligibility, you may visit the website: www.silversneakers.com or contact program at [phone] Mon-Fri between 8AM-8PM.   . Maintain a healthy weight. The body mass index (BMI) is a screening tool to identify possible weight problems. It provides an estimate of body fat based on height and weight. Your health care provider can find your BMI and can help you achieve or maintain a healthy weight.   For adults 20 years and older: ? A BMI below 18.5 is considered underweight. ? A BMI of 18.5 to 24.9 is normal. ? A BMI of 25 to 29.9 is considered overweight. ? A BMI of 30 and above is  considered obese.   . Maintain normal blood lipids and cholesterol levels by exercising and minimizing your intake of saturated fat. Eat a balanced diet with plenty of fruit and vegetables. Blood tests for lipids and cholesterol should begin at age 12 and be repeated every 5 years. If your lipid or cholesterol levels are high, you are over 50, or you are at high risk for heart disease, you may need your cholesterol levels checked more frequently. Ongoing high lipid and cholesterol levels should be treated with medicines if diet and exercise are not working.  . If you smoke, find out from your health care provider how to quit. If  you do not use tobacco, please do not start.  . If you choose to drink alcohol, please do not consume more than 2 drinks per day. One drink is considered to be 12 ounces (355 mL) of beer, 5 ounces (148 mL) of wine, or 1.5 ounces (44 mL) of liquor.  . If you are 26-57 years old, ask your health care provider if you should take aspirin to prevent strokes.  . Use sunscreen. Apply sunscreen liberally and repeatedly throughout the day. You should seek shade when your shadow is shorter than you. Protect yourself by wearing long sleeves, pants, a wide-brimmed hat, and sunglasses year round, whenever you are outdoors.  . Once a month, do a whole body skin exam, using a mirror to look at the skin on your back. Tell your health care provider of new moles, moles that have irregular borders, moles that are larger than a pencil eraser, or moles that have changed in shape or color.

## 2016-11-06 NOTE — Progress Notes (Signed)
Subjective:   Cynthia Roberson is a 66 y.o. female who presents for an Initial Medicare Annual Wellness Visit.  Review of Systems    N/A  Cardiac Risk Factors include: advanced age (>30men, >55 women);hypertension;obesity (BMI >30kg/m2)     Objective:    Today's Vitals   11/06/16 0913  BP: 118/80  Pulse: 76  Temp: 98.6 F (37 C)  TempSrc: Oral  SpO2: 97%  Weight: 181 lb (82.1 kg)  Height: 5\' 2"  (1.575 m)  PainSc: 0-No pain   Body mass index is 33.11 kg/m.   Current Medications (verified) Outpatient Encounter Prescriptions as of 11/06/2016  Medication Sig  . acetaminophen (TYLENOL) 500 MG tablet Take 500 mg by mouth every 6 (six) hours as needed.  . Ascorbic Acid (VITAMIN C) 1000 MG tablet Take 1,000 mg by mouth daily.  . Biotin 5 MG TABS Take 5 mg by mouth daily.   . Cholecalciferol (VITAMIN D) 2000 UNITS tablet Take 4,000 Units by mouth daily.   . COD LIVER OIL PO Take 1 capsule by mouth daily.  . Coenzyme Q10 (COQ10) 100 MG CAPS Take 1 capsule by mouth daily.  [name] glucose blood (ONETOUCH VERIO) test strip Use to test sugars two times weekly  . ibuprofen (ADVIL,MOTRIN) 200 MG tablet Take 200 mg by mouth every 6 (six) hours as needed. 1-2 prn  . loratadine (CLARITIN) 10 MG tablet Take 10 mg by mouth daily as needed.   . metFORMIN (GLUCOPHAGE-XR) 500 MG 24 hr tablet TAKE 1 TABLET(500 MG) BY MOUTH TWICE DAILY  . Minoxidil (ROGAINE MENS) 5 % FOAM Apply topically.  . Multiple Vitamins-Minerals (EYE VITAMINS PO) Take 2 capsules by mouth 2 (two) times daily.  . nebivolol (BYSTOLIC) 2.5 MG tablet Take 1 tablet (2.5 mg total) by mouth daily.  Glory Rosebush DELICA LANCETS [id number] MISC Use to test sugars 2 times weekly  . ranitidine (ZANTAC) 150 MG tablet Take 150 mg by mouth daily.   . SELENIUM PO Take 1 capsule by mouth daily.  [name] spironolactone (ALDACTONE) 25 MG tablet TAKE 1 TABLET (25 MG) BY MOUTH DAILY  . SYNTHROID 50 MCG tablet TAKE 1 TABLET BY MOUTH EVERY DAY BEFROE BREAKFAST  .  Turmeric Curcumin 500 MG CAPS Take 1 capsule by mouth daily.  . vitamin B-12 (CYANOCOBALAMIN) 500 MCG tablet Take 500 mcg by mouth daily.  . Zinc 50 MG TABS Take 1 tablet by mouth daily.  . Magnesium 250 MG TABS Take 1 tablet by mouth daily.  . metFORMIN (GLUCOPHAGE-XR) 500 MG 24 hr tablet TAKE 1 TABLET(500 MG) BY MOUTH TWICE DAILY (Patient not taking: Reported on 11/06/2016)  . vitamin E 400 UNIT capsule Take 400 Units by mouth daily.   No facility-administered encounter medications on file as of 11/06/2016.     Allergies (verified) Amoxicillin; Levothyroxine; Statins; and Sulfa antibiotics   History: Past Medical History:  Diagnosis Date  . Hirsutism   . Hyperandrogenemia   . Hypertension   . Osteopenia   . Other alopecia   . Polycystic ovaries   . Unspecified hypothyroidism    Past Surgical History:  Procedure Laterality Date  . TONSILLECTOMY    . WISDOM TOOTH EXTRACTION     Family History  Problem Relation Age of Onset  . Heart failure Mother   . Cancer Mother 18    lung  . Heart disease Father   . Stroke Father   . Hypertension Father    Social History   Occupational History  . Not on  file.   Social History Main Topics  . Smoking status: Never Smoker  . Smokeless tobacco: Never Used  . Alcohol use 0.6 oz/week    1 Glasses of wine per week  . Drug use: No  . Sexual activity: Yes    Tobacco Counseling Counseling given: No   Activities of Daily Living In your present state of health, do you have any difficulty performing the following activities: 11/06/2016  Hearing? N  Vision? N  Difficulty concentrating or making decisions? N  Walking or climbing stairs? N  Dressing or bathing? N  Doing errands, shopping? N  Preparing Food and eating ? N  Using the Toilet? N  In the past six months, have you accidently leaked urine? N  Do you have problems with loss of bowel control? N  Managing your Medications? N  Managing your Finances? N  Housekeeping or  managing your Housekeeping? N  Some recent data might be hidden    Immunizations and Health Maintenance Immunization History  Administered Date(s) Administered  . Influenza Split 07/11/2014  . Influenza,inj,Quad PF,36+ Mos 07/28/2015, 07/05/2016  . Pneumococcal Conjugate-13 08/15/2015  . Tdap 04/11/2011   There are no preventive care reminders to display for this patient.  Patient Care Team: Ria Bush, MD as PCP - General (Family Medicine) Philemon Kingdom, MD as Consulting Physician (Internal Medicine) Rolm Bookbinder, MD as Consulting Physician (Dermatology) Rolley Sims, OD as Consulting Physician (Optometry) Arta Silence, MD as Consulting Physician (Gastroenterology) Brien Few, MD as Consulting Physician (Obstetrics and Gynecology) Nyra Capes, DDS as Consulting Physician (Dentistry) Harrie Foreman, MD as Consulting Physician (Neurology)     Assessment:   This is a routine wellness examination for [place].   Hearing/Vision screen  Hearing Screening   125Hz  250Hz  500Hz  1000Hz  2000Hz  3000Hz  4000Hz  6000Hz  8000Hz   Right ear:   40 40 40  40    Left ear:   40 40 40  40    Vision Screening Comments: Last vision exam in December 2017 with Dr. [name]  Dietary issues and exercise activities discussed: Current Exercise Habits: The patient does not participate in regular exercise at present, Exercise limited by: None identified  Goals    . Exercise 3x per week (30 min per time)    . Increase physical activity          When schedule permits, I will exercise at least 30 min 3 days per week.       Depression Screen PHQ 2/9 Scores 11/06/2016  PHQ - 2 Score 0    Fall Risk Fall Risk  11/06/2016  Falls in the past year? No    Cognitive Function: MMSE - Mini Mental State Exam 11/06/2016  Orientation to time 5  Orientation to Place 5  Registration 3  Attention/ Calculation 0  Recall 3  Language- name 2 objects 0  Language- repeat 1  Language- follow 3 step  command 3  Language- read & follow direction 0  Write a sentence 0  Copy design 0  Total score 20     PLEASE NOTE: A Mini-Cog screen was completed. Maximum score is 20. A value of 0 denotes this part of Folstein MMSE was not completed or the patient failed this part of the Mini-Cog screening.   Mini-Cog Screening Orientation to Time - Max 5 pts Orientation to Place - Max 5 pts Registration - Max 3 pts Recall - Max 3 pts Language Repeat - Max 1 pts Language Follow 3 Step Command - Max 3 pts  Screening Tests Health Maintenance  Topic Date Due  . PNA vac Low Risk Adult (2 of 2 - PPSV23) 11/27/2016 (Originally 08/14/2016)  . MAMMOGRAM  12/26/2016  . DTaP/Tdap/Td (2 - Td) 04/10/2021  . TETANUS/TDAP  04/10/2021  . COLONOSCOPY  12/26/2021  . INFLUENZA VACCINE  Completed  . DEXA SCAN  Addressed  . ZOSTAVAX  Completed  . Hepatitis C Screening  Addressed      Plan:     I have personally reviewed and addressed the Medicare Annual Wellness questionnaire and have noted the following in the patient's chart:  A. Medical and social history B. Use of alcohol, tobacco or illicit drugs  C. Current medications and supplements D. Functional ability and status E.  Nutritional status F.  Physical activity G. Advance directives H. List of other physicians I.  Hospitalizations, surgeries, and ER visits in previous 12 months J.  [place] to include hearing, vision, cognitive, depression L. Referrals and appointments - none  In addition, I have reviewed and discussed with patient certain preventive protocols, quality metrics, and best practice recommendations. A written personalized care plan for preventive services as well as general preventive health recommendations were provided to patient.  See attached scanned questionnaire for additional information.   Signed,   Lindell Noe, MHA, BS, LPN Health Coach

## 2016-11-06 NOTE — Progress Notes (Signed)
Pre visit review using our clinic review tool, if applicable. No additional management support is needed unless otherwise documented below in the visit note. 

## 2016-11-10 NOTE — Progress Notes (Signed)
I reviewed health advisor's note, was available for consultation, and agree with documentation and plan.  

## 2016-11-11 ENCOUNTER — Ambulatory Visit (INDEPENDENT_AMBULATORY_CARE_PROVIDER_SITE_OTHER): Payer: Medicare Other | Admitting: Family Medicine

## 2016-11-11 ENCOUNTER — Encounter: Payer: Self-pay | Admitting: Family Medicine

## 2016-11-11 VITALS — BP 118/70 | HR 72 | Temp 98.3°F | Wt 181.0 lb

## 2016-11-11 DIAGNOSIS — M8580 Other specified disorders of bone density and structure, unspecified site: Secondary | ICD-10-CM

## 2016-11-11 DIAGNOSIS — Z0000 Encounter for general adult medical examination without abnormal findings: Secondary | ICD-10-CM

## 2016-11-11 DIAGNOSIS — D751 Secondary polycythemia: Secondary | ICD-10-CM | POA: Diagnosis not present

## 2016-11-11 DIAGNOSIS — R7303 Prediabetes: Secondary | ICD-10-CM

## 2016-11-11 DIAGNOSIS — R059 Cough, unspecified: Secondary | ICD-10-CM

## 2016-11-11 DIAGNOSIS — E282 Polycystic ovarian syndrome: Secondary | ICD-10-CM

## 2016-11-11 DIAGNOSIS — Z7189 Other specified counseling: Secondary | ICD-10-CM | POA: Diagnosis not present

## 2016-11-11 DIAGNOSIS — E785 Hyperlipidemia, unspecified: Secondary | ICD-10-CM

## 2016-11-11 DIAGNOSIS — R05 Cough: Secondary | ICD-10-CM

## 2016-11-11 DIAGNOSIS — E039 Hypothyroidism, unspecified: Secondary | ICD-10-CM

## 2016-11-11 DIAGNOSIS — Z23 Encounter for immunization: Secondary | ICD-10-CM

## 2016-11-11 DIAGNOSIS — I10 Essential (primary) hypertension: Secondary | ICD-10-CM

## 2016-11-11 DIAGNOSIS — E281 Androgen excess: Secondary | ICD-10-CM

## 2016-11-11 LAB — POC URINALSYSI DIPSTICK (AUTOMATED)
BILIRUBIN UA: NEGATIVE
Glucose, UA: NEGATIVE
Ketones, UA: NEGATIVE
Leukocytes, UA: NEGATIVE
Nitrite, UA: NEGATIVE
PH UA: 6
Protein, UA: NEGATIVE
RBC UA: NEGATIVE
Spec Grav, UA: 1.025
Urobilinogen, UA: 0.2

## 2016-11-11 NOTE — Patient Instructions (Addendum)
Pneumovax today.  Good to see you today, call us with questions.  Let me know when shingles shot done, when last colonoscopy was done.  Double check with Dr Cruzita Lederer about biotin.  Return in 1 month to recheck labs.  Urinalysis today.  Good to see you today, call us with questions.  Return as needed or in 6 months for follow up visit.   Health Maintenance, Female Introduction Adopting a healthy lifestyle and getting preventive care can go a long way to promote health and wellness. Talk with your health care provider about what schedule of regular examinations is right for you. This is a good chance for you to check in with your provider about disease prevention and staying healthy. In between checkups, there are plenty of things you can do on your own. Experts have done a lot of research about which lifestyle changes and preventive measures are most likely to keep you healthy. Ask your health care provider for more information. Weight and diet Eat a healthy diet  Be sure to include plenty of vegetables, fruits, low-fat dairy products, and lean protein.  Do not eat a lot of foods high in solid fats, added sugars, or salt.  Get regular exercise. This is one of the most important things you can do for your health.  Most adults should exercise for at least 150 minutes each week. The exercise should increase your heart rate and make you sweat (moderate-intensity exercise).  Most adults should also do strengthening exercises at least twice a week. This is in addition to the moderate-intensity exercise. Maintain a healthy weight  Body mass index (BMI) is a measurement that can be used to identify possible weight problems. It estimates body fat based on height and weight. Your health care provider can help determine your BMI and help you achieve or maintain a healthy weight.  For females 40 years of age and older:  A BMI below 18.5 is considered underweight.  A BMI of 18.5 to 24.9 is  normal.  A BMI of 25 to 29.9 is considered overweight.  A BMI of 30 and above is considered obese. Watch levels of cholesterol and blood lipids  You should start having your blood tested for lipids and cholesterol at 67 years of age, then have this test every 5 years.  You may need to have your cholesterol levels checked more often if:  Your lipid or cholesterol levels are high.  You are older than 67 years of age.  You are at high risk for heart disease. Cancer screening Lung Cancer  Lung cancer screening is recommended for adults 30-75 years old who are at high risk for lung cancer because of a history of smoking.  A yearly low-dose CT scan of the lungs is recommended for people who:  Currently smoke.  Have quit within the past 15 years.  Have at least a 30-pack-year history of smoking. A pack year is smoking an average of one pack of cigarettes a day for 1 year.  Yearly screening should continue until it has been 15 years since you quit.  Yearly screening should stop if you develop a health problem that would prevent you from having lung cancer treatment. Breast Cancer  Practice breast self-awareness. This means understanding how your breasts normally appear and feel.  It also means doing regular breast self-exams. Let your health care provider know about any changes, no matter how small.  If you are in your 20s or 30s, you should have a clinical  breast exam (CBE) by a health care provider every 1-3 years as part of a regular health exam.  If you are 51 or older, have a CBE every year. Also consider having a breast X-ray (mammogram) every year.  If you have a family history of breast cancer, talk to your health care provider about genetic screening.  If you are at high risk for breast cancer, talk to your health care provider about having an MRI and a mammogram every year.  Breast cancer gene (BRCA) assessment is recommended for women who have family members with  BRCA-related cancers. BRCA-related cancers include:  Breast.  Ovarian.  Tubal.  Peritoneal cancers.  Results of the assessment will determine the need for genetic counseling and BRCA1 and BRCA2 testing. Cervical Cancer  Your health care provider may recommend that you be screened regularly for cancer of the pelvic organs (ovaries, uterus, and vagina). This screening involves a pelvic examination, including checking for microscopic changes to the surface of your cervix (Pap test). You may be encouraged to have this screening done every 3 years, beginning at age 39.  For women ages 62-65, health care providers may recommend pelvic exams and Pap testing every 3 years, or they may recommend the Pap and pelvic exam, combined with testing for human papilloma virus (HPV), every 5 years. Some types of HPV increase your risk of cervical cancer. Testing for HPV may also be done on women of any age with unclear Pap test results.  Other health care providers may not recommend any screening for nonpregnant women who are considered low risk for pelvic cancer and who do not have symptoms. Ask your health care provider if a screening pelvic exam is right for you.  If you have had past treatment for cervical cancer or a condition that could lead to cancer, you need Pap tests and screening for cancer for at least 20 years after your treatment. If Pap tests have been discontinued, your risk factors (such as having a new sexual partner) need to be reassessed to determine if screening should resume. Some women have medical problems that increase the chance of getting cervical cancer. In these cases, your health care provider may recommend more frequent screening and Pap tests. Colorectal Cancer  This type of cancer can be detected and often prevented.  Routine colorectal cancer screening usually begins at 67 years of age and continues through 67 years of age.  Your health care provider may recommend screening at  an earlier age if you have risk factors for colon cancer.  Your health care provider may also recommend using home test kits to check for hidden blood in the stool.  A small camera at the end of a tube can be used to examine your colon directly (sigmoidoscopy or colonoscopy). This is done to check for the earliest forms of colorectal cancer.  Routine screening usually begins at age 12.  Direct examination of the colon should be repeated every 5-10 years through 67 years of age. However, you may need to be screened more often if early forms of precancerous polyps or small growths are found. Skin Cancer  Check your skin from head to toe regularly.  Tell your health care provider about any new moles or changes in moles, especially if there is a change in a mole's shape or color.  Also tell your health care provider if you have a mole that is larger than the size of a pencil eraser.  Always use sunscreen. Apply sunscreen liberally  and repeatedly throughout the day.  Protect yourself by wearing long sleeves, pants, a wide-brimmed hat, and sunglasses whenever you are outside. Heart disease, diabetes, and high blood pressure  High blood pressure causes heart disease and increases the risk of stroke. High blood pressure is more likely to develop in:  People who have blood pressure in the high end of the normal range (130-139/85-89 mm Hg).  People who are overweight or obese.  People who are African American.  If you are 72-68 years of age, have your blood pressure checked every 3-5 years. If you are 65 years of age or older, have your blood pressure checked every year. You should have your blood pressure measured twice-once when you are at a hospital or clinic, and once when you are not at a hospital or clinic. Record the average of the two measurements. To check your blood pressure when you are not at a hospital or clinic, you can use:  An automated blood pressure machine at a pharmacy.  A  home blood pressure monitor.  If you are between 43 years and 70 years old, ask your health care provider if you should take aspirin to prevent strokes.  Have regular diabetes screenings. This involves taking a blood sample to check your fasting blood sugar level.  If you are at a normal weight and have a low risk for diabetes, have this test once every three years after 67 years of age.  If you are overweight and have a high risk for diabetes, consider being tested at a younger age or more often. Preventing infection Hepatitis B  If you have a higher risk for hepatitis B, you should be screened for this virus. You are considered at high risk for hepatitis B if:  You were born in a country where hepatitis B is common. Ask your health care provider which countries are considered high risk.  Your parents were born in a high-risk country, and you have not been immunized against hepatitis B (hepatitis B vaccine).  You have HIV or AIDS.  You use needles to inject street drugs.  You live with someone who has hepatitis B.  You have had sex with someone who has hepatitis B.  You get hemodialysis treatment.  You take certain medicines for conditions, including cancer, organ transplantation, and autoimmune conditions. Hepatitis C  Blood testing is recommended for:  Everyone born from [birth year] through 1966.  Anyone with known risk factors for hepatitis C. Sexually transmitted infections (STIs)  You should be screened for sexually transmitted infections (STIs) including gonorrhea and chlamydia if:  You are sexually active and are younger than 67 years of age.  You are older than 67 years of age and your health care provider tells you that you are at risk for this type of infection.  Your sexual activity has changed since you were last screened and you are at an increased risk for chlamydia or gonorrhea. Ask your health care provider if you are at risk.  If you do not have HIV, but are  at risk, it may be recommended that you take a prescription medicine daily to prevent HIV infection. This is called pre-exposure prophylaxis (PrEP). You are considered at risk if:  You are sexually active and do not regularly use condoms or know the HIV status of your partner(s).  You take drugs by injection.  You are sexually active with a partner who has HIV. Talk with your health care provider about whether you are at high  risk of being infected with HIV. If you choose to begin PrEP, you should first be tested for HIV. You should then be tested every 3 months for as long as you are taking PrEP. Pregnancy  If you are premenopausal and you may become pregnant, ask your health care provider about preconception counseling.  If you may become pregnant, take 400 to 800 micrograms (mcg) of folic acid every day.  If you want to prevent pregnancy, talk to your health care provider about birth control (contraception). Osteoporosis and menopause  Osteoporosis is a disease in which the bones lose minerals and strength with aging. This can result in serious bone fractures. Your risk for osteoporosis can be identified using a bone density scan.  If you are 87 years of age or older, or if you are at risk for osteoporosis and fractures, ask your health care provider if you should be screened.  Ask your health care provider whether you should take a calcium or vitamin D supplement to lower your risk for osteoporosis.  Menopause may have certain physical symptoms and risks.  Hormone replacement therapy may reduce some of these symptoms and risks. Talk to your health care provider about whether hormone replacement therapy is right for you. Follow these instructions at home:  Schedule regular health, dental, and eye exams.  Stay current with your immunizations.  Do not use any tobacco products including cigarettes, chewing tobacco, or electronic cigarettes.  If you are pregnant, do not drink  alcohol.  If you are breastfeeding, limit how much and how often you drink alcohol.  Limit alcohol intake to no more than 1 drink per day for nonpregnant women. One drink equals 12 ounces of beer, 5 ounces of wine, or 1 ounces of hard liquor.  Do not use street drugs.  Do not share needles.  Ask your health care provider for help if you need support or information about quitting drugs.  Tell your health care provider if you often feel depressed.  Tell your health care provider if you have ever been abused or do not feel safe at home. This information is not intended to replace advice given to you by your health care provider. Make sure you discuss any questions you have with your health care provider. Document Released: 04/29/2011 Document Revised: 03/21/2016 Document Reviewed: 07/18/2015  2017 [employer]

## 2016-11-11 NOTE — Progress Notes (Signed)
BP 118/70   Pulse 72   Temp 98.3 F (36.8 C) (Oral)   Wt 181 lb (82.1 kg)   BMI 33.11 kg/m    CC: CPE Subjective:    Patient ID: Cynthia Roberson, female    DOB: [birth year], 66 y.o.   MRN: [id number]  HPI: Cynthia Roberson is a 66 y.o. female presenting on 11/11/2016 for Annual Exam   Here with husband [name]. Saw Lesia last week for medicare wellness visit. Note reviewed.   Followed by Dr Cruzita Lederer for PCOS, hyperandrogenism and hypothyroidism. On brand synthroid.   Pt has noticed slight thenar atrophy on R. L handed but states she's ambidextrous  Recent bronchitis treated at [facility] with clarithromycin - took floristor along with this.   Preventative: Colon cancer screening - sees Dr Paulita Fujita @[facility]  (2016) Lung cancer screening - not eligible Well woman - sees Dr Ronita Hipps yearly  Breast cancer screening - at [facility] office  DEXA - h/o osteopenia  Flu shot - yearly Tdap 2012 prevnar 2016, pneumovax today Shingles shot - done at [facility] directive discussion - received advanced directive packet last month - working on this.  Seat belt use discussed Sunscreen use discussed, no changing moles on skin - sees derm yearly Nonsmoker. + second hand smoke. Alcohol - rare   Lives with husband [name] Occ: retired, was middle Research scientist (physical sciences)  Activity: stays active caring for grandchildren Diet: good water, fruits/vegetables daily   Relevant past medical, surgical, family and social history reviewed and updated as indicated. Interim medical history since our last visit reviewed. Allergies and medications reviewed and updated. Current Outpatient Prescriptions on File Prior to Visit  Medication Sig  . Biotin 5 MG TABS Take 5 mg by mouth daily.   . Cholecalciferol (VITAMIN D) 2000 UNITS tablet Take 4,000 Units by mouth daily.   . COD LIVER OIL PO Take 1 capsule by mouth daily.  . Coenzyme Q10 (COQ10) 100 MG CAPS Take 1 capsule by mouth daily.  [name] loratadine (CLARITIN) 10 MG tablet  Take 10 mg by mouth daily as needed.   . Minoxidil (ROGAINE MENS) 5 % FOAM Apply topically.  . Multiple Vitamins-Minerals (EYE VITAMINS PO) Take 2 capsules by mouth 2 (two) times daily.  . nebivolol (BYSTOLIC) 2.5 MG tablet Take 1 tablet (2.5 mg total) by mouth daily.  . ranitidine (ZANTAC) 150 MG tablet Take 150 mg by mouth daily.   . SELENIUM PO Take 1 capsule by mouth daily.  [name] spironolactone (ALDACTONE) 25 MG tablet TAKE 1 TABLET (25 MG) BY MOUTH DAILY  . SYNTHROID 50 MCG tablet TAKE 1 TABLET BY MOUTH EVERY DAY BEFROE BREAKFAST  . Turmeric Curcumin 500 MG CAPS Take 1 capsule by mouth daily.  . vitamin B-12 (CYANOCOBALAMIN) 500 MCG tablet Take 500 mcg by mouth daily.  . vitamin E 400 UNIT capsule Take 400 Units by mouth daily.  . Zinc 50 MG TABS Take 1 tablet by mouth daily.  [name] glucose blood (ONETOUCH VERIO) test strip Use to test sugars two times weekly (Patient not taking: Reported on 11/11/2016)  . ONETOUCH DELICA LANCETS 99991111 MISC Use to test sugars 2 times weekly (Patient not taking: Reported on 11/11/2016)   No current facility-administered medications on file prior to visit.     Review of Systems  Constitutional: Negative for activity change, appetite change, chills, fatigue, fever and unexpected weight change.  HENT: Negative for hearing loss.   Eyes: Negative for visual disturbance.  Respiratory: Negative for cough, chest tightness, shortness  of breath and wheezing.   Cardiovascular: Negative for chest pain, palpitations and leg swelling.  Gastrointestinal: Negative for abdominal distention, abdominal pain, blood in stool, constipation, diarrhea, nausea and vomiting.  Genitourinary: Negative for difficulty urinating and hematuria.  Musculoskeletal: Negative for arthralgias, myalgias and neck pain.  Skin: Negative for rash.  Neurological: Negative for dizziness, seizures, syncope and headaches.  Hematological: Negative for adenopathy. Does not bruise/bleed easily.    Psychiatric/Behavioral: Negative for dysphoric mood. The patient is not nervous/anxious.    Per HPI unless specifically indicated in ROS section     Objective:    BP 118/70   Pulse 72   Temp 98.3 F (36.8 C) (Oral)   Wt 181 lb (82.1 kg)   BMI 33.11 kg/m   Wt Readings from Last 3 Encounters:  11/11/16 181 lb (82.1 kg)  11/06/16 181 lb (82.1 kg)  07/25/16 183 lb (83 kg)    Physical Exam  Constitutional: She is oriented to person, place, and time. She appears well-developed and well-nourished. No distress.  HENT:  Head: Normocephalic and atraumatic.  Right Ear: Hearing, tympanic membrane, external ear and ear canal normal.  Left Ear: Hearing, tympanic membrane, external ear and ear canal normal.  Nose: Nose normal.  Mouth/Throat: Uvula is midline, oropharynx is clear and moist and mucous membranes are normal. No oropharyngeal exudate, posterior oropharyngeal edema or posterior oropharyngeal erythema.  Eyes: Conjunctivae and EOM are normal. Pupils are equal, round, and reactive to light. No scleral icterus.  Neck: Normal range of motion. Neck supple. No thyromegaly present.  Cardiovascular: Normal rate, regular rhythm, normal heart sounds and intact distal pulses.   No murmur heard. Pulses:      Radial pulses are 2+ on the right side, and 2+ on the left side.  Pulmonary/Chest: Effort normal and breath sounds normal. No respiratory distress. She has no wheezes. She has no rales.  Abdominal: Soft. Bowel sounds are normal. She exhibits no distension and no mass. There is no tenderness. There is no rebound and no guarding.  Musculoskeletal: Normal range of motion. She exhibits no edema.  Lymphadenopathy:    She has no cervical adenopathy.  Neurological: She is alert and oriented to person, place, and time.  CN grossly intact, station and gait intact Grip strength intact  Skin: Skin is warm and dry. No rash noted.  Psychiatric: She has a normal mood and affect. Her behavior is  normal. Judgment and thought content normal.  Nursing note and vitals reviewed.  Results for orders placed or performed in visit on 11/06/16  Lipid panel  Result Value Ref Range   Cholesterol 236 (H) 0 - 200 mg/dL   Triglycerides 108.0 0.0 - 149.0 mg/dL   HDL 54.40 >39.00 mg/dL   VLDL 21.6 0.0 - 40.0 mg/dL   LDL Cholesterol 160 (H) 0 - 99 mg/dL   Total CHOL/HDL Ratio 4    NonHDL 181.17   CBC with Differential/Platelet  Result Value Ref Range   WBC 7.0 4.0 - 10.5 K/uL   RBC 6.17 (H) 3.87 - 5.11 Mil/uL   Hemoglobin 16.2 (H) 12.0 - 15.0 g/dL   HCT 48.8 (H) 36.0 - 46.0 %   MCV 79.1 78.0 - 100.0 fl   MCHC 33.1 30.0 - 36.0 g/dL   RDW 14.4 11.5 - 15.5 %   Platelets 282.0 150.0 - 400.0 K/uL   Neutrophils Relative % 61.8 43.0 - 77.0 %   Lymphocytes Relative 30.5 12.0 - 46.0 %   Monocytes Relative 6.3 3.0 -  12.0 %   Eosinophils Relative 0.9 0.0 - 5.0 %   Basophils Relative 0.5 0.0 - 3.0 %   Neutro Abs 4.3 1.4 - 7.7 K/uL   Lymphs Abs 2.1 0.7 - 4.0 K/uL   Monocytes Absolute 0.4 0.1 - 1.0 K/uL   Eosinophils Absolute 0.1 0.0 - 0.7 K/uL   Basophils Absolute 0.0 0.0 - 0.1 K/uL  Basic metabolic panel  Result Value Ref Range   Sodium 137 135 - 145 mEq/L   Potassium 5.5 (H) 3.5 - 5.1 mEq/L   Chloride 102 96 - 112 mEq/L   CO2 28 19 - 32 mEq/L   Glucose, Bld 127 (H) 70 - 99 mg/dL   BUN 16 6 - 23 mg/dL   Creatinine, Ser 0.97 0.40 - 1.20 mg/dL   Calcium 10.5 8.4 - 10.5 mg/dL   GFR 60.95 >60.00 mL/min  VITAMIN D 25 Hydroxy (Vit-D Deficiency, Fractures)  Result Value Ref Range   VITD 47.85 30.00 - 100.00 ng/mL      Assessment & Plan:   Problem List Items Addressed This Visit    Advanced care planning/counseling discussion    Advanced directive discussion - received advanced directive packet last month - working on this.       Cough    Slowly resolving.       Dyslipidemia    Chronic, off meds. Reviewed recent readings. Recommended low chol diet.       Essential hypertension,  benign    Chronic, stable. Continue current regimen.  Found to be mildly hyperkalemic from spironolactone. rec increased water, avoid high potassium foods and choose low potassium foods (handout provided), recheck next month rpt labs.       Hyperandrogenemia   Hypothyroidism    Chronic, stable. Followed by endo.       Osteopenia    Discussed calcium, vit D intake, weight bearing exercises      PCOS (polycystic ovarian syndrome)    Followed by endo      Polycythemia, secondary    New. Reviewed recent CXR. Update UA today. Rpt labs 1 month.       Relevant Orders   Comprehensive metabolic panel   CBC with Differential/Platelet   Prediabetes    Continue metformin ER in h/o PCOS. Consider updated A1c.      Routine general medical examination at a health care facility - Primary    Preventative protocols reviewed and updated unless pt declined. Discussed healthy diet and lifestyle.           Follow up plan: Return in about 6 months (around 05/11/2017) for follow up visit.  Ria Bush, MD

## 2016-11-11 NOTE — Progress Notes (Signed)
Pre visit review using our clinic review tool, if applicable. No additional management support is needed unless otherwise documented below in the visit note. 

## 2016-11-11 NOTE — Assessment & Plan Note (Signed)
Chronic, stable. Continue current regimen.  Found to be mildly hyperkalemic from spironolactone. rec increased water, avoid high potassium foods and choose low potassium foods (handout provided), recheck next month rpt labs.

## 2016-11-11 NOTE — Assessment & Plan Note (Signed)
Chronic, off meds. Reviewed recent readings. Recommended low chol diet.

## 2016-11-11 NOTE — Assessment & Plan Note (Signed)
New. Reviewed recent CXR. Update UA today. Rpt labs 1 month.

## 2016-11-11 NOTE — Assessment & Plan Note (Signed)
Discussed calcium, vit D intake, weight bearing exercises

## 2016-11-11 NOTE — Assessment & Plan Note (Signed)
Chronic, stable. Followed by endo.

## 2016-11-11 NOTE — Assessment & Plan Note (Signed)
Followed by endo.  

## 2016-11-11 NOTE — Assessment & Plan Note (Signed)
Advanced directive discussion - received advanced directive packet last month - working on this.

## 2016-11-11 NOTE — Assessment & Plan Note (Signed)
Slowly resolving 

## 2016-11-11 NOTE — Assessment & Plan Note (Signed)
Preventative protocols reviewed and updated unless pt declined. Discussed healthy diet and lifestyle.  

## 2016-11-11 NOTE — Assessment & Plan Note (Signed)
Continue metformin ER in h/o PCOS. Consider updated A1c.

## 2016-11-11 NOTE — Addendum Note (Signed)
Addended by: Royann Shivers A on: 11/11/2016 11:19 AM   Modules accepted: Orders

## 2016-12-09 ENCOUNTER — Other Ambulatory Visit: Payer: Self-pay | Admitting: Internal Medicine

## 2016-12-13 ENCOUNTER — Other Ambulatory Visit: Payer: Medicare Other

## 2017-01-08 ENCOUNTER — Other Ambulatory Visit: Payer: Self-pay | Admitting: Internal Medicine

## 2017-01-22 ENCOUNTER — Ambulatory Visit (INDEPENDENT_AMBULATORY_CARE_PROVIDER_SITE_OTHER): Payer: Medicare Other | Admitting: Internal Medicine

## 2017-01-22 ENCOUNTER — Encounter: Payer: Self-pay | Admitting: Internal Medicine

## 2017-01-22 VITALS — BP 122/78 | HR 68 | Ht 62.0 in | Wt 181.0 lb

## 2017-01-22 DIAGNOSIS — D582 Other hemoglobinopathies: Secondary | ICD-10-CM | POA: Diagnosis not present

## 2017-01-22 DIAGNOSIS — E875 Hyperkalemia: Secondary | ICD-10-CM

## 2017-01-22 DIAGNOSIS — E039 Hypothyroidism, unspecified: Secondary | ICD-10-CM | POA: Diagnosis not present

## 2017-01-22 DIAGNOSIS — R7303 Prediabetes: Secondary | ICD-10-CM | POA: Diagnosis not present

## 2017-01-22 DIAGNOSIS — E281 Androgen excess: Secondary | ICD-10-CM | POA: Diagnosis not present

## 2017-01-22 NOTE — Patient Instructions (Addendum)
Please stop Spironolactone.  Please come back for labs in 1 month and in 6 months for another visit.

## 2017-01-22 NOTE — Progress Notes (Addendum)
Patient ID: Cynthia Roberson, female   DOB: 04-Feb-1951, 66 y.o.   MRN: [id number]   HPI  Cynthia Roberson is a 66 y.o.-year-old female, returning for post menopausal hyperandrogenism, hypothyroidism, prediabetes. Last visit with me 6 mo ago.  She had gastroenteritis this past weekend.  She had labs by PCP in 10/2016 that showed a high potassium, at 5.5 and a high Hb, at 16.2.  Reviewed history: Patient had a long history of hyperandrogenism (high testosterone level), and has a history of PCOS + infertility. She could not tolerate OCPs in the past (migraines).   Reviewed the records from Dr. Howell Rucks:  The patient has a history of scalp hair thinning since her younger years, for which she has been using prescription strength, Rogaine with good results. She is using it only once daily and hair thinning has slowed down. She also has noticed increased hair growth over her face, shoulders, chest, abdomen, and change in the texture of these hairs over past several years.  No acne.  She is now postmenopausal since ~ age 45. She had a always had a history of irregular menses and had been on birth control in the past. She had difficulty with conception and had used Clomid. She has had 2 successful pregnancies in the past. Following this, the patient had regular menses for some time until she attained menopause.  Patient denies signs of masculinization.  Testosterone level - elevated at 130 in 2013.  Subsequent levels were still high, however, all of the levels below were drawn while on spironolactone. We stopped checking her testosterone levels after 03/2015, as we continued spironolactone. Component     Latest Ref Rng 07/06/2014 07/14/2014 10/11/2014 04/26/2015  Testosterone     3 - 41 ng/dL 173 (H)  97 (H)   Sex Hormone Binding     18 - 114 nmol/L 31     Testosterone Free     0.0 - 4.2 pg/mL 33.8 (H)  6.1 (H)   Testosterone-% Free     0.4 - 2.4 % 2.0     FSH      26.1     LH      20.3      DHEA-SO4     29.4 - 220.5 ug/dL  77.0    Testosterone, total     7.0 - 40.0 ng/dL    122.7 (H)   Other previous workup: - Ruled out for Cushing's syndrome based on 1 mg dex suppression test- morning cortisol is 1.47 (05/2013) - DHEAS 44.3, Prolactin 5.9 (05/2013) - CT Abdomen and pelvis with contrast (04/2013) - No adrenal mass or any ovarian abnormality - Ovarian US was negative (06/2014) At last visit with Dr.Phadke, the use of flutamide was discussed, as well as the possibility of oophorectomy.  She is also seeing Dr Ronita Hipps [facility] and Dr Ubaldo Glassing (derm).  The patient has been on spironolactone treatment since 05/2013. In 2015, the patient had mild elevation in her potassium level, following which dose of spironolactone was reduced from 50 to 25 mg daily. Latest potassium level was high at previous check in 10/2016: Lab Results  Component Value Date   K 5.5 (H) 11/06/2016  She was advised to eat less potassium rich foods.  She is not noticing a significant effect from this low dose of spironolactone on the hirsutism. She is also on minoxidil which helps with scalp hair loss.  She also has a history of hypothyroidism, well controlled. - Dx 1995 - She is on Synthroid d.a.w.  50 g daily, taken correctly - She is on biotin 5000 g daily - not in last 3 weeks. - Last thyroid test:  Lab Results  Component Value Date   TSH 2.14 07/25/2016   She has prediabetes.  - She did not tolerate  regular metformin in the past >> She is on metformin ER 1000 mg at b'fast >> tolerates this well.   Last HbA1c:  Lab Results  Component Value Date   HGBA1C 5.9 07/25/2016   Last eye exam: Abstract on 07/20/2016  Component Date Value Ref Range Status  . 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 Otherwise, unchanged from my initial visit note. She decreased the Bystolic dose.  Past Medical History:  Diagnosis Date  . Clostridium difficile infection 2013  . Hirsutism   . Hyperandrogenemia   . Hypertension   . Osteopenia    DEXA 2016  . Other alopecia   . Polycystic ovaries   . Unspecified hypothyroidism    Past Surgical History:  Procedure Laterality Date  . COLONOSCOPY  2015   h/o polyps, rpt 5 yrs (Outlaw)  . TONSILLECTOMY    . WISDOM TOOTH EXTRACTION     Social History   Social History  . Marital Status: Married    Spouse Name: N/A  . Number of Children: 2   Occupational History  . Retired Pharmacist, hospital, now Counsellor   Social History Main Topics  . Smoking status: Never Smoker   . Smokeless tobacco: Not on file  . Alcohol Use:     1-3 Glasses of wine per year  . Drug Use: No  . Sexual Activity: Yes   Current Outpatient Prescriptions on File Prior to Visit  Medication Sig Dispense Refill  . Biotin 5 MG TABS Take 5 mg by mouth daily.     . Cholecalciferol (VITAMIN D) 2000 UNITS tablet Take 4,000 Units by mouth daily.     . COD LIVER OIL PO Take 1 capsule by mouth daily.    . Coenzyme Q10 (COQ10) 100 MG CAPS Take 1 capsule by mouth daily.    [name] glucose blood (ONETOUCH VERIO) test strip Use to test sugars two times weekly (Patient not taking: Reported on 11/11/2016) 30 each 12  . loratadine (CLARITIN) 10 MG tablet Take 10 mg by mouth daily as needed.      . metFORMIN (GLUCOPHAGE-XR) 500 MG 24 hr tablet Take 2 tablets (1,000 mg total) by mouth daily with breakfast.    . Minoxidil (ROGAINE MENS) 5 % FOAM Apply topically.    . Multiple Vitamins-Minerals (EYE VITAMINS PO) Take 2 capsules by mouth 2 (two) times daily.    . nebivolol (BYSTOLIC) 2.5 MG tablet Take 1 tablet (2.5 mg total) by mouth daily. 30 tablet 6  . NONFORMULARY OR COMPOUNDED ITEM 2 capsules 2 (two) times daily. Hydro-eye    . ONETOUCH DELICA LANCETS 13Y MISC Use to test sugars 2 times weekly (Patient not taking: Reported on  11/11/2016) 30 each 11  . ranitidine (ZANTAC) 150 MG tablet Take 150 mg by mouth daily.     . SELENIUM PO Take 1 capsule by mouth daily.    [name] spironolactone (ALDACTONE) 25 MG tablet TAKE 1 TABLET (25 MG) BY MOUTH DAILY 180 tablet 3  . SYNTHROID 50 MCG tablet TAKE 1 TABLET BY MOUTH EVERY DAY BEFROE BREAKFAST 30 tablet 0  . Turmeric Curcumin 500 MG CAPS Take 1 capsule by mouth daily.    . vitamin B-12 (CYANOCOBALAMIN) 500 MCG tablet Take 500 mcg by mouth daily.    . vitamin E 400 UNIT capsule Take 400 Units by mouth daily.    . Zinc 50 MG TABS Take 1 tablet by mouth daily.     No current facility-administered medications on file prior to visit.    Allergies  Allergen Reactions  . Amoxicillin Rash  . Levothyroxine Palpitations    With generic levothyroxine, tolerates well the brand name.  . Statins Palpitations    Tried Crestor and others  . Sulfa Antibiotics Rash   Family History  Problem Relation Age of Onset  . Heart failure Mother   . Cancer Mother 20    lung  . Heart disease Father   . Stroke Father   . Hypertension Father    PE: BP 122/78 (BP Location: Left Arm, Patient Position: Sitting)   Pulse 68   Ht 5\' 2"  (1.575 m)   Wt 181 lb (82.1 kg)   SpO2 97%   BMI 33.11 kg/m  Wt Readings from Last 3 Encounters:  01/22/17 181 lb (82.1 kg)  11/11/16 181 lb (82.1 kg)  11/06/16 181 lb (82.1 kg)   Constitutional: overweight, in NAD Eyes:  PERRLA, EOMI, no exophthalmos ENT: moist mucous membranes, no thyromegaly, no cervical lymphadenopathy Cardiovascular: RRR, No MRG Respiratory: CTA B Gastrointestinal: abdomen soft, NT, ND, BS+ Musculoskeletal: no deformities, strength intact in all 4 Skin: moist, warm, no rashes; no dark terminal hair on chin, no acne Neurological: no tremor with outstretched hands, DTR normal in all 4  ASSESSMENT: 1. High testosterone level in a postmenopausal woman  2. Hypothyroidism  3. Prediabetes  PLAN:  1. High testosterone level in a postmenopausal woman  - possibly secondary to insulin resistance or previous undiagnosed PCOS, or can be a new occurrence in the setting of adrenal tumors, ovarian tumors or ovarian hyperthecosis. The latter 3 conditions usually occur with higher levels of testosterone, > 150 ng/mL. Patient had repeated CT imaging of her adrenals and also a transvaginal ultrasound for evaluation of her ovaries, and these tests were negative for any masses or hyperplasia. Her hormonal testing was negative for hyperprolactinemia, pituitary dysfunction, Cushing syndrome.  - Patient continues on spironolactone however, she is on the lowest dose, 25 mg daily because of hyperkalemia. Last potassium level was reviewed and it was elevated on 11/06/2016 (5.5).  - She is also using Rogaine for her female-pattern baldness - She is also on metformin ER for her prediabetes, and she feels that this helps with her hirsutism.  - She discussed with Dr. Howell Rucks and Dr. Ronita Hipps before about the possibility of performing oophorectomy to look for ovarian hyperthecosis or small hilar ovarian tumors. She refused at that time since she wanted to try spironolactone. However, due to to her elevations in potassium, I do not feel it is safe to continue spironolactone for now. We will try to stop and recheck testosterone in 1 month. If this is still elevated, I would recommend oophorectomy, especially  since she also has  elevated hemoglobin (higher at last check by PCP, 16.2) and this can be an other consequence of high testosterone levels.I plan to repeat the potassium and the hemoglobin when she returns for labs in 1 month - while on spironolactone, testosterone levels are not conclusive, since spironolactone mostly blocks the receptors for testosterone and thus preventing its action, rather than decreasing testosterone levels. Therefore, we will not check the testosterone today, but will have her back in 1 month off spironolactone and check then.  - I will see her back in 6 months.  2. Hypothyroidism - This appears to be controlled on her current dose of Synthroid 50 g daily - discussed to take the thyroid hormone every day, with water, at least 30 minutes before breakfast, separated by at least 4 hours from: - acid reflux medications - calcium - iron - multivitamins She is taking this correctly - We reviewed her previous PFTs and these were normal in 06/2016. We will not repeat today.  3. Prediabetes - Continue Metformin ER 1000 mg with lunch - will repeat hemoglobin A1cin 1 month  Orders Placed This Encounter  Procedures  . Testosterone, Free, Total, SHBG  . Hemoglobin A1c  . COMPLETE METABOLIC PANEL WITH GFR  . CBC   Philemon Kingdom, MD PhD [facility] Endocrinology

## 2017-01-29 ENCOUNTER — Other Ambulatory Visit: Payer: Self-pay | Admitting: Family Medicine

## 2017-01-29 ENCOUNTER — Other Ambulatory Visit: Payer: Self-pay | Admitting: Internal Medicine

## 2017-01-31 ENCOUNTER — Other Ambulatory Visit: Payer: Self-pay | Admitting: Internal Medicine

## 2017-02-21 ENCOUNTER — Other Ambulatory Visit (INDEPENDENT_AMBULATORY_CARE_PROVIDER_SITE_OTHER): Payer: Medicare Other

## 2017-02-21 DIAGNOSIS — R7303 Prediabetes: Secondary | ICD-10-CM | POA: Diagnosis not present

## 2017-02-21 DIAGNOSIS — D582 Other hemoglobinopathies: Secondary | ICD-10-CM | POA: Diagnosis not present

## 2017-02-21 DIAGNOSIS — E875 Hyperkalemia: Secondary | ICD-10-CM

## 2017-02-21 DIAGNOSIS — E281 Androgen excess: Secondary | ICD-10-CM | POA: Diagnosis not present

## 2017-02-21 LAB — COMPLETE METABOLIC PANEL WITH GFR
AG Ratio: 1.9 Ratio (ref 1.0–2.5)
ALT: 30 U/L — ABNORMAL HIGH (ref 6–29)
AST: 24 U/L (ref 10–35)
Albumin: 4.4 g/dL (ref 3.6–5.1)
Alkaline Phosphatase: 52 U/L (ref 33–130)
BILIRUBIN TOTAL: 0.6 mg/dL (ref 0.2–1.2)
BUN / CREAT RATIO: 17.6 ratio (ref 6–22)
BUN: 18 mg/dL (ref 7–25)
CO2: 26 mmol/L (ref 20–31)
CREATININE: 1.02 mg/dL — AB (ref 0.50–0.99)
Calcium: 9.6 mg/dL (ref 8.6–10.4)
Chloride: 102 mmol/L (ref 98–110)
GFR, EST AFRICAN AMERICAN: 66 mL/min (ref >=60)
GFR, Est Non African American: 57 mL/min — ABNORMAL LOW (ref >=60)
GLOBULIN: 2.3 g/dL (ref 1.9–3.7)
Glucose, Bld: 96 mg/dL (ref 65–99)
Potassium: 5.2 mmol/L (ref 3.5–5.3)
Sodium: 139 mmol/L (ref 135–146)
Total Protein: 6.7 g/dL (ref 6.1–8.1)

## 2017-02-21 LAB — CBC
HCT: 45.2 % (ref 36.0–46.0)
Hemoglobin: 14.8 g/dL (ref 12.0–15.0)
MCHC: 32.7 g/dL (ref 30.0–36.0)
MCV: 79.3 fl (ref 78.0–100.0)
Platelets: 233 10*3/uL (ref 150.0–400.0)
RBC: 5.71 Mil/uL — AB (ref 3.87–5.11)
RDW: 14.1 % (ref 11.5–15.5)
WBC: 4.5 10*3/uL (ref 4.0–10.5)

## 2017-02-21 LAB — HEMOGLOBIN A1C: Hgb A1c MFr Bld: 5.9 % (ref 4.6–6.5)

## 2017-02-24 LAB — TESTOSTERONE, FREE, TOTAL, SHBG
Sex Hormone Binding: 47.9 nmol/L (ref 17.3–125.0)
TESTOSTERONE FREE: 2.1 pg/mL (ref 0.0–4.2)
Testosterone: 71 ng/dL — ABNORMAL HIGH (ref 3–41)

## 2017-02-26 ENCOUNTER — Encounter: Payer: Self-pay | Admitting: Internal Medicine

## 2017-03-11 ENCOUNTER — Other Ambulatory Visit: Payer: Self-pay | Admitting: Internal Medicine

## 2017-03-24 ENCOUNTER — Encounter: Payer: Self-pay | Admitting: Family Medicine

## 2017-03-24 DIAGNOSIS — E785 Hyperlipidemia, unspecified: Secondary | ICD-10-CM

## 2017-04-11 ENCOUNTER — Other Ambulatory Visit: Payer: Self-pay | Admitting: Internal Medicine

## 2017-05-07 ENCOUNTER — Other Ambulatory Visit: Payer: Self-pay | Admitting: Internal Medicine

## 2017-05-09 ENCOUNTER — Ambulatory Visit (INDEPENDENT_AMBULATORY_CARE_PROVIDER_SITE_OTHER): Payer: Medicare Other | Admitting: Family Medicine

## 2017-05-09 ENCOUNTER — Encounter: Payer: Self-pay | Admitting: Family Medicine

## 2017-05-09 VITALS — BP 120/80 | HR 70 | Ht 61.0 in | Wt 178.5 lb

## 2017-05-09 DIAGNOSIS — E281 Androgen excess: Secondary | ICD-10-CM

## 2017-05-09 DIAGNOSIS — D751 Secondary polycythemia: Secondary | ICD-10-CM | POA: Diagnosis not present

## 2017-05-09 DIAGNOSIS — E282 Polycystic ovarian syndrome: Secondary | ICD-10-CM | POA: Diagnosis not present

## 2017-05-09 DIAGNOSIS — M791 Myalgia: Secondary | ICD-10-CM

## 2017-05-09 DIAGNOSIS — M7918 Myalgia, other site: Secondary | ICD-10-CM

## 2017-05-09 LAB — POC URINALSYSI DIPSTICK (AUTOMATED)
BILIRUBIN UA: NEGATIVE
Glucose, UA: NEGATIVE
KETONES UA: NEGATIVE
Leukocytes, UA: NEGATIVE
Nitrite, UA: NEGATIVE
PH UA: 6 (ref 5.0–8.0)
PROTEIN UA: NEGATIVE
RBC UA: NEGATIVE
Spec Grav, UA: 1.025 (ref 1.010–1.025)
Urobilinogen, UA: 0.2 E.U./dL

## 2017-05-09 MED ORDER — NEBIVOLOL HCL 2.5 MG PO TABS: ORAL_TABLET | ORAL | 1 refills | Status: DC

## 2017-05-09 NOTE — Progress Notes (Signed)
Pre visit review using our clinic review tool, if applicable. No additional management support is needed unless otherwise documented below in the visit note. 

## 2017-05-09 NOTE — Progress Notes (Signed)
BP 120/80   Pulse 70   Ht 5\' 1"  (1.549 m)   Wt 178 lb 8 oz (81 kg)   SpO2 98%   BMI 33.73 kg/m    CC: 6 mo f/u visit Subjective:    Patient ID: Cynthia Roberson, female    DOB: [birth year], 66 y.o.   MRN: [id number]  HPI: Cynthia Roberson is a 66 y.o. female presenting on 05/09/2017 for Follow-up and Hip Pain   Here with husband [name].  Sees Dr Cruzita Lederer for PCOS, hyperandrogenism, hypothyroidism.   Pt and daughter concerned about mild polycythemia over several years, but on last check Hgb was normal range. RBC has been elevated.   Noticing increased fatigue - she does care for grandchildren full time. Ongoing trouble sleeping at night without snoring.   Doesn't snore. No known h/o OSA.  Non smoker. No know h/o kidney disease.   R buttock soreness more pronounced over last few weeks. Pain radiates down leg. Started after fall where she landed on concrete 15 yrs ago. Occasional mild lower back pain. No groin pain.   Relevant past medical, surgical, family and social history reviewed and updated as indicated. Interim medical history since our last visit reviewed. Allergies and medications reviewed and updated. Outpatient Medications Prior to Visit  Medication Sig Dispense Refill  . Biotin 5 MG TABS Take 5 mg by mouth daily.     . Cholecalciferol (VITAMIN D) 2000 UNITS tablet Take 4,000 Units by mouth daily.     . COD LIVER OIL PO Take 1 capsule by mouth daily.    . Coenzyme Q10 (COQ10) 100 MG CAPS Take 1 capsule by mouth daily.    [name] glucose blood (ONETOUCH VERIO) test strip Use to test sugars two times weekly 30 each 12  . loratadine (CLARITIN) 10 MG tablet Take 10 mg by mouth daily as needed.     . Minoxidil (ROGAINE MENS) 5 % FOAM Apply topically.    . Multiple Vitamins-Minerals (EYE VITAMINS PO) Take 2 capsules by mouth 2 (two) times daily.    . NONFORMULARY OR COMPOUNDED ITEM 2 capsules 2 (two) times daily. Hydro-eye    . ONETOUCH DELICA LANCETS 63O MISC Use to test sugars 2  times weekly 30 each 11  . ranitidine (ZANTAC) 150 MG tablet Take 150 mg by mouth daily.     [name] SYNTHROID 50 MCG tablet TAKE 1 TABLET BY MOUTH EVERY DAY BEFORE BREAKFAST 30 tablet 0  . Turmeric Curcumin 500 MG CAPS Take 1 capsule by mouth daily.    . vitamin B-12 (CYANOCOBALAMIN) 500 MCG tablet Take 500 mcg by mouth daily.    . vitamin E 400 UNIT capsule Take 400 Units by mouth daily.    . Zinc 50 MG TABS Take 1 tablet by mouth daily.    [name] BYSTOLIC 2.5 MG tablet TAKE 1 TABLET(2.5 MG) BY MOUTH DAILY 30 tablet 3  . metFORMIN (GLUCOPHAGE-XR) 500 MG 24 hr tablet Take 2 tablets (1,000 mg total) by mouth daily with breakfast.    . metFORMIN (GLUCOPHAGE-XR) 500 MG 24 hr tablet TAKE 1 TABLET(500 MG) BY MOUTH TWICE DAILY 180 tablet 0  . spironolactone (ALDACTONE) 25 MG tablet TAKE 1 TABLET (25 MG) BY MOUTH DAILY 180 tablet 3   No facility-administered medications prior to visit.      Per HPI unless specifically indicated in ROS section below Review of Systems     Objective:    BP 120/80   Pulse 70   Ht 5\' 1"  (1.549  m)   Wt 178 lb 8 oz (81 kg)   SpO2 98%   BMI 33.73 kg/m   Wt Readings from Last 3 Encounters:  05/09/17 178 lb 8 oz (81 kg)  01/22/17 181 lb (82.1 kg)  11/11/16 181 lb (82.1 kg)    Physical Exam  Constitutional: She appears well-developed and well-nourished. No distress.  HENT:  Mouth/Throat: Oropharynx is clear and moist. No oropharyngeal exudate.  Cardiovascular: Normal rate, regular rhythm, normal heart sounds and intact distal pulses.   No murmur heard. Pulmonary/Chest: Effort normal and breath sounds normal. No respiratory distress. She has no wheezes. She has no rales.  Musculoskeletal: She exhibits no edema.  No pain midline spine No paraspinous mm tenderness Neg SLR bilaterally. No pain with int/ext rotation at hip. Neg FABER. No pain at SIJ, GTB or sciatic notch bilaterally.   Skin: Skin is warm and dry. No rash noted.  Psychiatric: She has a normal mood and  affect.  Nursing note and vitals reviewed.  Results for orders placed or performed in visit on 05/09/17  POCT Urinalysis Dipstick (Automated)  Result Value Ref Range   Color, UA YELLOW    Clarity, UA CLEAR    Glucose, UA NEG    Bilirubin, UA NEG    Ketones, UA NEG    Spec Grav, UA 1.025 1.010 - 1.025   Blood, UA NEG    pH, UA 6.0 5.0 - 8.0   Protein, UA NEG    Urobilinogen, UA 0.2 0.2 or 1.0 E.U./dL   Nitrite, UA NEG    Leukocytes, UA Negative Negative   Lab Results  Component Value Date   HGBA1C 5.9 02/21/2017       Assessment & Plan:   Problem List Items Addressed This Visit    Hyperandrogenemia    Appreciate endo care. Testosterone level not high enough to suspect causative of polycythemia.  She stopped spironolactone.       PCOS (polycystic ovarian syndrome)    Appreciate endo care.       Polycythemia, secondary - Primary    Actually improving. Unclear duration. Non smoker, no known OSA.  RTC 1 wk lab visit only to recheck. If deteriorating, low threshold to refer to heme. Pt agrees with plan.       Relevant Orders   POCT Urinalysis Dipstick (Automated) (Completed)   CBC with Differential/Platelet   Comprehensive metabolic panel   Right buttock pain    Description consistent with sciatica, exam overall benign. ?sciatica vs hip/pelvic musculature weakness. Encouraged she start lateral leg raise exercises.           Follow up plan: Return in about 6 months (around 11/09/2017), or if symptoms worsen or fail to improve, for annual exam, prior fasting for blood work, medicare wellness visit.  Ria Bush, MD

## 2017-05-09 NOTE — Patient Instructions (Addendum)
Urinalysis today.  R hip pain sounds like you may have some hip muscle weakness with dysfunction. Treat with lateral leg raises discussed today.  Return in 1 month for lab visit only. Return in 6 months for medicare wellness visit and physical.

## 2017-05-10 DIAGNOSIS — M7918 Myalgia, other site: Secondary | ICD-10-CM | POA: Insufficient documentation

## 2017-05-10 NOTE — Assessment & Plan Note (Addendum)
Appreciate endo care. Testosterone level not high enough to suspect causative of polycythemia.  She stopped spironolactone.

## 2017-05-10 NOTE — Assessment & Plan Note (Signed)
Actually improving. Unclear duration. Non smoker, no known OSA.  RTC 1 wk lab visit only to recheck. If deteriorating, low threshold to refer to heme. Pt agrees with plan.

## 2017-05-10 NOTE — Assessment & Plan Note (Signed)
Appreciate endo care.  

## 2017-05-10 NOTE — Assessment & Plan Note (Signed)
Description consistent with sciatica, exam overall benign. ?sciatica vs hip/pelvic musculature weakness. Encouraged she start lateral leg raise exercises.

## 2017-05-12 ENCOUNTER — Other Ambulatory Visit: Payer: Self-pay | Admitting: Internal Medicine

## 2017-06-04 ENCOUNTER — Other Ambulatory Visit (INDEPENDENT_AMBULATORY_CARE_PROVIDER_SITE_OTHER): Payer: Medicare Other

## 2017-06-04 ENCOUNTER — Other Ambulatory Visit: Payer: Self-pay | Admitting: Internal Medicine

## 2017-06-04 DIAGNOSIS — D751 Secondary polycythemia: Secondary | ICD-10-CM

## 2017-06-04 LAB — CBC WITH DIFFERENTIAL/PLATELET
Basophils Absolute: 0 10*3/uL (ref 0.0–0.1)
Basophils Relative: 0.5 % (ref 0.0–3.0)
Eosinophils Absolute: 0.1 10*3/uL (ref 0.0–0.7)
Eosinophils Relative: 2.9 % (ref 0.0–5.0)
HEMATOCRIT: 46.3 % — AB (ref 36.0–46.0)
HEMOGLOBIN: 14.8 g/dL (ref 12.0–15.0)
Lymphocytes Relative: 37 % (ref 12.0–46.0)
Lymphs Abs: 1.8 10*3/uL (ref 0.7–4.0)
MCHC: 32.1 g/dL (ref 30.0–36.0)
MCV: 80.3 fl (ref 78.0–100.0)
Monocytes Absolute: 0.4 10*3/uL (ref 0.1–1.0)
Monocytes Relative: 8.9 % (ref 3.0–12.0)
NEUTROS ABS: 2.5 10*3/uL (ref 1.4–7.7)
Neutrophils Relative %: 50.7 % (ref 43.0–77.0)
PLATELETS: 238 10*3/uL (ref 150.0–400.0)
RBC: 5.76 Mil/uL — ABNORMAL HIGH (ref 3.87–5.11)
RDW: 14 % (ref 11.5–15.5)
WBC: 4.9 10*3/uL (ref 4.0–10.5)

## 2017-06-04 LAB — COMPREHENSIVE METABOLIC PANEL
ALBUMIN: 4.4 g/dL (ref 3.5–5.2)
ALT: 36 U/L — AB (ref 0–35)
AST: 25 U/L (ref 0–37)
Alkaline Phosphatase: 52 U/L (ref 39–117)
BILIRUBIN TOTAL: 0.6 mg/dL (ref 0.2–1.2)
BUN: 17 mg/dL (ref 6–23)
CALCIUM: 9.9 mg/dL (ref 8.4–10.5)
CO2: 33 meq/L — AB (ref 19–32)
CREATININE: 0.88 mg/dL (ref 0.40–1.20)
Chloride: 103 mEq/L (ref 96–112)
GFR: 68.08 mL/min (ref >60.00)
Glucose, Bld: 103 mg/dL — ABNORMAL HIGH (ref 70–99)
Potassium: 5.1 mEq/L (ref 3.5–5.1)
Sodium: 140 mEq/L (ref 135–145)
Total Protein: 7.2 g/dL (ref 6.0–8.3)

## 2017-07-08 ENCOUNTER — Other Ambulatory Visit: Payer: Self-pay | Admitting: Internal Medicine

## 2017-07-24 ENCOUNTER — Encounter: Payer: Self-pay | Admitting: Internal Medicine

## 2017-07-24 ENCOUNTER — Ambulatory Visit (INDEPENDENT_AMBULATORY_CARE_PROVIDER_SITE_OTHER): Payer: Medicare Other | Admitting: Internal Medicine

## 2017-07-24 VITALS — BP 124/82 | HR 81 | Wt 174.0 lb

## 2017-07-24 DIAGNOSIS — R7303 Prediabetes: Secondary | ICD-10-CM | POA: Diagnosis not present

## 2017-07-24 DIAGNOSIS — E282 Polycystic ovarian syndrome: Secondary | ICD-10-CM | POA: Diagnosis not present

## 2017-07-24 DIAGNOSIS — E281 Androgen excess: Secondary | ICD-10-CM | POA: Diagnosis not present

## 2017-07-24 DIAGNOSIS — Z23 Encounter for immunization: Secondary | ICD-10-CM

## 2017-07-24 DIAGNOSIS — E039 Hypothyroidism, unspecified: Secondary | ICD-10-CM | POA: Diagnosis not present

## 2017-07-24 LAB — HEMOGLOBIN A1C: Hgb A1c MFr Bld: 5.8 % (ref 4.6–6.5)

## 2017-07-24 LAB — TSH: TSH: 2.06 u[IU]/mL (ref 0.35–4.50)

## 2017-07-24 LAB — T4, FREE: FREE T4: 1.03 ng/dL (ref 0.60–1.60)

## 2017-07-24 NOTE — Progress Notes (Signed)
Patient ID: Daila Elbert, female   DOB: 1951/04/18, 66 y.o.   MRN: [id number]   HPI  Kaylen Motl is a 66 y.o.-year-old female, returning for post menopausal hyperandrogenism, hypothyroidism, prediabetes. Last visit with me 6 mo ago.  She had labs by PCP in 10/2016 that showed a high potassium, at 5.5 and a high Hb, at 16.2. We stopped Spironolactone at last visit. She feels much better after this and lost 7 lbs!   Reviewed history: Patient had a long history of hyperandrogenism (high testosterone level), and has a history of PCOS + infertility. She could not tolerate OCPs in the past (migraines).   Reviewed the records from Dr. Howell Rucks:  The patient has a history of scalp hair thinning since her younger years, for which she has been using prescription strength, Rogaine with good results. She is using it only once daily and hair thinning has slowed down. She also has noticed increased hair growth over her face, shoulders, chest, abdomen, and change in the texture of these hairs over past several years.  No acne.  She is postmenopausal since ~ age 48. She had a always had a history of irregular menses and had been on birth control in the past. She had difficulty with conception and had used Clomid. She has had 2 successful pregnancies in the past. Following this, the patient had regular menses for some time until she attained menopause.  No signs of masculinization.  Testosterone level - elevated at 130 in 2013.  Subsequent levels were still high, however, all of the levels below were drawn while on spironolactone. We stopped checking her testosterone levels after 03/2015, as we continued spironolactone. Component     Latest Ref Rng 07/06/2014 07/14/2014 10/11/2014 04/26/2015  Testosterone     3 - 41 ng/dL 173 (H)  97 (H)   Sex Hormone Binding     18 - 114 nmol/L 31     Testosterone Free     0.0 - 4.2 pg/mL 33.8 (H)  6.1 (H)   Testosterone-% Free     0.4 - 2.4 % 2.0     FSH      26.1      LH      20.3     DHEA-SO4     29.4 - 220.5 ug/dL  77.0    Testosterone, total     7.0 - 40.0 ng/dL    122.7 (H)   Other previous workup: - Ruled out for Cushing's syndrome based on 1 mg dex suppression test- morning cortisol is 1.47 (05/2013) - DHEAS 44.3, Prolactin 5.9 (05/2013) - CT Abdomen and pelvis with contrast (04/2013) - No adrenal mass or any ovarian abnormality - Ovarian US was negative (06/2014) At last visit with Dr.Phadke, the use of flutamide was discussed, as well as the possibility of oophorectomy.  She is also seeing Dr Ronita Hipps [facility]) and Dr Ubaldo Glassing (derm).  She has been on spironolactone since 05/2013. Since then, she had intermittent mild elevation in her potassium level so the dose had to be reduced from 50 to 25 mg daily. She also has slightly high hemoglobin levels. At last visit,we both decided that it is time to stop the spironolactone and see if her hirsutism and frontal alopecia worsens. Since then, she noticed slightly more hairs on her chin, but not problematic, as she waxes. Her potassium level is normal, although at the upper limit of normal:  Lab Results  Component Value Date   K 5.1 06/04/2017   She continues  on minoxidil for scalp hair loss.  She also has a history of hypothyroidism, well controlled. - Dx 1995 - She is on Synthroid d.a.w. 50 g daily, taken correctly - She is on biotin 5000 g daily - she has not taken this in the last month - Last thyroid test:  Lab Results  Component Value Date   TSH 2.14 07/25/2016   She has prediabetes.  - She did not tolerate  regular metformin in the past >> She is on metformin ER 1000 mg with b'fast  Last HbA1c:  Lab Results  Component Value Date   HGBA1C 5.9 02/21/2017   Last eye exam: 06/2016 >> No DR  ROS: Constitutional: no weight gain/no weight loss, no fatigue, no subjective hyperthermia, no subjective hypothermia Eyes: no blurry vision, no xerophthalmia ENT: no sore throat, no nodules  palpated in throat, no dysphagia, no odynophagia, no hoarseness Cardiovascular: no CP/no SOB/no palpitations/no leg swelling Respiratory: no cough/no SOB/no wheezing Gastrointestinal: no N/no V/no D/no C/no acid reflux Musculoskeletal: no muscle aches/no joint aches Skin: no rashes, no acne; + 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 Otherwise, unchanged from my initial visit note.  Past Medical History:  Diagnosis Date  . Clostridium difficile infection 2013  . Hirsutism   . Hyperandrogenemia   . Hypertension   . Osteopenia    DEXA 2016  . Other alopecia   . Polycystic ovaries   . Unspecified hypothyroidism    Past Surgical History:  Procedure Laterality Date  . COLONOSCOPY  2015   h/o polyps, rpt 5 yrs (Outlaw)  . TONSILLECTOMY    . WISDOM TOOTH EXTRACTION     Social History   Social History  . Marital Status: Married    Spouse Name: N/A  . Number of Children: 2   Occupational History  . Retired Pharmacist, hospital, now Counsellor   Social History Main Topics  . Smoking status: Never Smoker   . Smokeless tobacco: Not on file  . Alcohol Use:     1-3 Glasses of wine per year  . Drug Use: No  . Sexual Activity: Yes   Current Outpatient Prescriptions on File Prior to Visit  Medication Sig Dispense Refill  . Biotin 5 MG TABS Take 5 mg by mouth daily.     . Cholecalciferol (VITAMIN D) 2000 UNITS tablet Take 4,000 Units by mouth daily.     [name] glucose blood (ONETOUCH VERIO) test strip Use to test sugars two times weekly 30 each 12  . loratadine (CLARITIN) 10 MG tablet Take 10 mg by mouth daily as needed.     . metFORMIN (GLUCOPHAGE-XR) 500 MG 24 hr tablet TAKE 1 TABLET(500 MG) BY MOUTH TWICE DAILY 180 tablet 0  . Minoxidil (ROGAINE MENS) 5 % FOAM Apply topically.    . nebivolol (BYSTOLIC) 2.5 MG  tablet Take one tablet daily 90 tablet 1  . NONFORMULARY OR COMPOUNDED ITEM 2 capsules 2 (two) times daily. Hydro-eye    . ONETOUCH DELICA LANCETS 84X MISC Use to test sugars 2 times weekly 30 each 11  . ranitidine (ZANTAC) 150 MG tablet Take 150 mg by mouth as needed.     [name] SYNTHROID 50 MCG tablet TAKE 1 TABLET BY MOUTH EVERY DAY BEFORE BREAKFAST 30 tablet 0  . Turmeric Curcumin 500 MG CAPS Take 1 capsule by mouth daily.    . vitamin B-12 (CYANOCOBALAMIN)  500 MCG tablet Take 500 mcg by mouth daily.    . vitamin E 400 UNIT capsule Take 400 Units by mouth daily.    . Zinc 50 MG TABS Take 1 tablet by mouth daily.     No current facility-administered medications on file prior to visit.    Allergies  Allergen Reactions  . Amoxicillin Rash  . Levothyroxine Palpitations    With generic levothyroxine, tolerates well the brand name.  . Statins Palpitations    Tried Crestor and others  . Sulfa Antibiotics Rash   Family History  Problem Relation Age of Onset  . Heart failure Mother   . Cancer Mother 46       lung  . Heart disease Father   . Stroke Father   . Hypertension Father    PE: BP 124/82 (BP Location: Left Arm, Patient Position: Sitting)   Pulse 81   Wt 174 lb (78.9 kg)   SpO2 97%   BMI 32.88 kg/m  Wt Readings from Last 3 Encounters:  07/24/17 174 lb (78.9 kg)  05/09/17 178 lb 8 oz (81 kg)  01/22/17 181 lb (82.1 kg)   Constitutional: overweight, in NAD Eyes: PERRLA, EOMI, no exophthalmos ENT: moist mucous membranes, no thyromegaly, no cervical lymphadenopathy Cardiovascular: RRR, No MRG Respiratory: CTA B Gastrointestinal: abdomen soft, NT, ND, BS+ Musculoskeletal: no deformities, strength intact in all 4 Skin: moist, warm, no rashes Neurological: no tremor with outstretched hands, DTR normal in all 4  ASSESSMENT: 1. High testosterone level in a postmenopausal woman  2. Hypothyroidism  3. Prediabetes  PLAN:  1. High testosterone level in a postmenopausal woman   - possibly secondary to insulin resistance or previous undiagnosed PCOS, or can be a new occurrence in the setting of adrenal tumors, ovarian tumors or ovarian hyperthecosis. The latter 3 conditions usually occur with higher levels of testosterone, > 150 ng/mL. Patient had repeated CT imaging of her adrenals and also a transvaginal ultrasound for evaluation of her ovaries, and these tests were negative for any masses or hyperplasia. Her hormonal testing was negative for hyperprolactinemia, pituitary dysfunction, Cushing syndrome.  - Patient was on spironolactone however, she is on the lowest dose, 25 mg daily because of hyperkalemia.  We ended up stopping the med  at last visit and she feels much better off the medication >> will check a testosterone level today, but the latest level obtained by PCP 2 months after stopping spironolactone was normal. - She is also using Rogaine for her female-pattern baldness >> will continue this   - She is also on metformin ER for her prediabetes, and she feels that this helps with her hirsutism >> We'll also continue this and check an HbA1c today.  - She discussed with Dr. Howell Rucks and Dr. Ronita Hipps before about the possibility of performing oophorectomy to look for ovarian hyperthecosis or small hilar ovarian tumors. She refused. We could continue to just follow her clinically and by labs for now. - RTC in 1 year  2. Hypothyroidism - latest thyroid labs reviewed with pt >> normal  - she continues on LT4 50 mcg daily - pt feels good on this dose. - we discussed about taking the thyroid hormone every day, with water, >30 minutes before breakfast, separated by >4 hours from acid reflux medications, calcium, iron, multivitamins. Pt. is taking it correctly - will check thyroid tests today: TSH and fT4 - If labs are abnormal, she will need to return for repeat TFTs in 1.5 months - OTW,  RTC in 1 year  3. Prediabetes - Continue Metformin ER 1000 mg with b'fast or lunch -  will repeat HbA1c now - Given flu shot today   Office Visit on 07/24/2017  Component Date Value Ref Range Status  . Testosterone 07/24/2017 82* 3 - 41 ng/dL Final  . Testosterone, Free 07/24/2017 2.8  0.0 - 4.2 pg/mL Final  . Sex Hormone Binding 07/24/2017 50.8  17.3 - 125.0 nmol/L Final  . Hgb A1c MFr Bld 07/24/2017 5.8  4.6 - 6.5 % Final   Glycemic Control Guidelines for People with Diabetes:Non Diabetic:  <6%Goal of Therapy: <7%Additional Action Suggested:  >8%   . TSH 07/24/2017 2.06  0.35 - 4.50 uIU/mL Final  . Free T4 07/24/2017 1.03  0.60 - 1.60 ng/dL Final   Comment: Specimens from patients who are undergoing biotin therapy and /or ingesting biotin supplements may contain high levels of biotin.  The higher biotin concentration in these specimens interferes with this Free T4 assay.  Specimens that contain high levels  of biotin may cause false high results for this Free T4 assay.  Please interpret results in light of the total clinical presentation of the patient.     Tests are normal. Philemon Kingdom, MD PhD [facility] Endocrinology

## 2017-07-24 NOTE — Patient Instructions (Signed)
Please stop at the lab.  Please continue Synthroid 50 mcg daily.  Take the thyroid hormone every day, with water, at least 30 minutes before breakfast, separated by at least 4 hours from: - acid reflux medications - calcium - iron - multivitamins  Please come back for a follow-up appointment in 1 year. 

## 2017-07-25 LAB — TESTOSTERONE, FREE, TOTAL, SHBG
SEX HORMONE BINDING: 50.8 nmol/L (ref 17.3–125.0)
Testosterone, Free: 2.8 pg/mL (ref 0.0–4.2)
Testosterone: 82 ng/dL — ABNORMAL HIGH (ref 3–41)

## 2017-08-06 ENCOUNTER — Other Ambulatory Visit: Payer: Self-pay | Admitting: Internal Medicine

## 2017-09-08 ENCOUNTER — Other Ambulatory Visit: Payer: Self-pay | Admitting: Internal Medicine

## 2017-09-10 ENCOUNTER — Telehealth: Payer: Self-pay | Admitting: Internal Medicine

## 2017-09-10 ENCOUNTER — Other Ambulatory Visit: Payer: Self-pay

## 2017-09-10 MED ORDER — LEVOTHYROXINE SODIUM 50 MCG PO TABS: ORAL_TABLET | ORAL | 1 refills | Status: DC

## 2017-09-10 MED ORDER — METFORMIN HCL ER 500 MG PO TB24: ORAL_TABLET | ORAL | 1 refills | Status: DC

## 2017-09-10 NOTE — Telephone Encounter (Signed)
Submitted

## 2017-09-10 NOTE — Telephone Encounter (Signed)
Need refill of metformin and synthroid 90 day supply [facility] [place]

## 2017-11-06 ENCOUNTER — Other Ambulatory Visit: Payer: Self-pay | Admitting: Family Medicine

## 2017-11-06 DIAGNOSIS — E039 Hypothyroidism, unspecified: Secondary | ICD-10-CM

## 2017-11-06 DIAGNOSIS — D751 Secondary polycythemia: Secondary | ICD-10-CM

## 2017-11-06 DIAGNOSIS — E785 Hyperlipidemia, unspecified: Secondary | ICD-10-CM

## 2017-11-06 DIAGNOSIS — R7303 Prediabetes: Secondary | ICD-10-CM

## 2017-11-07 ENCOUNTER — Ambulatory Visit (INDEPENDENT_AMBULATORY_CARE_PROVIDER_SITE_OTHER): Payer: Medicare Other

## 2017-11-07 ENCOUNTER — Other Ambulatory Visit: Payer: Self-pay | Admitting: Family Medicine

## 2017-11-07 VITALS — BP 110/78 | HR 70 | Temp 98.5°F | Ht 62.0 in | Wt 176.8 lb

## 2017-11-07 DIAGNOSIS — D751 Secondary polycythemia: Secondary | ICD-10-CM | POA: Diagnosis not present

## 2017-11-07 DIAGNOSIS — R7303 Prediabetes: Secondary | ICD-10-CM | POA: Diagnosis not present

## 2017-11-07 DIAGNOSIS — E785 Hyperlipidemia, unspecified: Secondary | ICD-10-CM

## 2017-11-07 DIAGNOSIS — Z0000 Encounter for general adult medical examination without abnormal findings: Secondary | ICD-10-CM | POA: Diagnosis not present

## 2017-11-07 DIAGNOSIS — E039 Hypothyroidism, unspecified: Secondary | ICD-10-CM

## 2017-11-07 LAB — CBC WITH DIFFERENTIAL/PLATELET
BASOS ABS: 0 10*3/uL (ref 0.0–0.1)
Basophils Relative: 0.6 % (ref 0.0–3.0)
Eosinophils Absolute: 0.1 10*3/uL (ref 0.0–0.7)
Eosinophils Relative: 1.9 % (ref 0.0–5.0)
HCT: 48.8 % — ABNORMAL HIGH (ref 36.0–46.0)
Hemoglobin: 15.4 g/dL — ABNORMAL HIGH (ref 12.0–15.0)
LYMPHS ABS: 2 10*3/uL (ref 0.7–4.0)
Lymphocytes Relative: 37.1 % (ref 12.0–46.0)
MCHC: 31.6 g/dL (ref 30.0–36.0)
MCV: 80.2 fl (ref 78.0–100.0)
MONO ABS: 0.4 10*3/uL (ref 0.1–1.0)
MONOS PCT: 7.9 % (ref 3.0–12.0)
NEUTROS PCT: 52.5 % (ref 43.0–77.0)
Neutro Abs: 2.8 10*3/uL (ref 1.4–7.7)
Platelets: 265 10*3/uL (ref 150.0–400.0)
RBC: 6.08 Mil/uL — AB (ref 3.87–5.11)
RDW: 14.4 % (ref 11.5–15.5)
WBC: 5.3 10*3/uL (ref 4.0–10.5)

## 2017-11-07 LAB — HEMOGLOBIN A1C: HEMOGLOBIN A1C: 5.9 % (ref 4.6–6.5)

## 2017-11-07 LAB — COMPREHENSIVE METABOLIC PANEL
ALK PHOS: 55 U/L (ref 39–117)
ALT: 29 U/L (ref 0–35)
AST: 21 U/L (ref 0–37)
Albumin: 4.6 g/dL (ref 3.5–5.2)
BILIRUBIN TOTAL: 0.5 mg/dL (ref 0.2–1.2)
BUN: 16 mg/dL (ref 6–23)
CO2: 32 mEq/L (ref 19–32)
Calcium: 9.9 mg/dL (ref 8.4–10.5)
Chloride: 98 mEq/L (ref 96–112)
Creatinine, Ser: 0.95 mg/dL (ref 0.40–1.20)
GFR: 62.24 mL/min (ref >60.00)
GLUCOSE: 104 mg/dL — AB (ref 70–99)
Potassium: 4.7 mEq/L (ref 3.5–5.1)
SODIUM: 136 meq/L (ref 135–145)
TOTAL PROTEIN: 7.4 g/dL (ref 6.0–8.3)

## 2017-11-07 LAB — LIPID PANEL
Cholesterol: 238 mg/dL — ABNORMAL HIGH (ref 0–200)
HDL: 66.7 mg/dL (ref >39.00)
LDL Cholesterol: 147 mg/dL — ABNORMAL HIGH (ref 0–99)
NONHDL: 170.94
Total CHOL/HDL Ratio: 4
Triglycerides: 122 mg/dL (ref 0.0–149.0)
VLDL: 24.4 mg/dL (ref 0.0–40.0)

## 2017-11-07 LAB — T4, FREE: Free T4: 1.05 ng/dL (ref 0.60–1.60)

## 2017-11-07 LAB — TSH: TSH: 1.87 u[IU]/mL (ref 0.35–4.50)

## 2017-11-07 MED ORDER — NEBIVOLOL HCL 2.5 MG PO TABS: ORAL_TABLET | ORAL | 0 refills | Status: DC

## 2017-11-07 NOTE — Telephone Encounter (Signed)
Copied from [place]. Topic: Quick Communication - See Telephone Encounter >> Nov 07, 2017 11:38 AM Ether Griffins B wrote: CRM for notification. See Telephone encounter for:  Pt needing refill on bystolic she has one pill left for tomorrow  11/07/17.

## 2017-11-07 NOTE — Patient Instructions (Signed)
Cynthia Roberson , Thank you for taking time to come for your Medicare Wellness Visit. I appreciate your ongoing commitment to your health goals. Please review the following plan we discussed and let me know if I can assist you in the future.   These are the goals we discussed: Goals         . Follow up with Primary Care Provider     Starting 11/07/2017, I will continue to take medications as prescribed and to keep appointments with PCP as scheduled.        This is a list of the screening recommended for you and due dates:  Health Maintenance  Topic Date Due  . Mammogram  12/26/2016  . DTaP/Tdap/Td vaccine (2 - Td) 04/10/2021  . Tetanus Vaccine  04/10/2021  . Colon Cancer Screening  12/26/2021  . Flu Shot  Completed  . DEXA scan (bone density measurement)  Completed  .  Hepatitis C: One time screening is recommended by Center for Disease Control  (CDC) for  adults born from [birth year] through 1966.   Completed  . Pneumonia vaccines  Completed   Preventive Care for Adults  A healthy lifestyle and preventive care can promote health and wellness. Preventive health guidelines for adults include the following key practices.  . A routine yearly physical is a good way to check with your health care provider about your health and preventive screening. It is a chance to share any concerns and updates on your health and to receive a thorough exam.  . Visit your dentist for a routine exam and preventive care every 6 months. Brush your teeth twice a day and floss once a day. Good oral hygiene prevents tooth decay and gum disease.  . The frequency of eye exams is based on your age, health, family medical history, use  of contact lenses, and other factors. Follow your health care provider's recommendations for frequency of eye exams.  . Eat a healthy diet. Foods like vegetables, fruits, whole grains, low-fat dairy products, and lean protein foods contain the nutrients you need without too many calories.  Decrease your intake of foods high in solid fats, added sugars, and salt. Eat the right amount of calories for you. Get information about a proper diet from your health care provider, if necessary.  . Regular physical exercise is one of the most important things you can do for your health. Most adults should get at least 150 minutes of moderate-intensity exercise (any activity that increases your heart rate and causes you to sweat) each week. In addition, most adults need muscle-strengthening exercises on 2 or more days a week.  Silver Sneakers may be a benefit available to you. To determine eligibility, you may visit the website: www.silversneakers.com or contact program at 9161300281 Mon-Fri between 8AM-8PM.   . Maintain a healthy weight. The body mass index (BMI) is a screening tool to identify possible weight problems. It provides an estimate of body fat based on height and weight. Your health care provider can find your BMI and can help you achieve or maintain a healthy weight.   For adults 20 years and older: ? A BMI below 18.5 is considered underweight. ? A BMI of 18.5 to 24.9 is normal. ? A BMI of 25 to 29.9 is considered overweight. ? A BMI of 30 and above is considered obese.   . Maintain normal blood lipids and cholesterol levels by exercising and minimizing your intake of saturated fat. Eat a balanced diet with plenty of  fruit and vegetables. Blood tests for lipids and cholesterol should begin at age 93 and be repeated every 5 years. If your lipid or cholesterol levels are high, you are over 50, or you are at high risk for heart disease, you may need your cholesterol levels checked more frequently. Ongoing high lipid and cholesterol levels should be treated with medicines if diet and exercise are not working.  . If you smoke, find out from your health care provider how to quit. If you do not use tobacco, please do not start.  . If you choose to drink alcohol, please do not consume  more than 2 drinks per day. One drink is considered to be 12 ounces (355 mL) of beer, 5 ounces (148 mL) of wine, or 1.5 ounces (44 mL) of liquor.  . If you are 74-37 years old, ask your health care provider if you should take aspirin to prevent strokes.  . Use sunscreen. Apply sunscreen liberally and repeatedly throughout the day. You should seek shade when your shadow is shorter than you. Protect yourself by wearing long sleeves, pants, a wide-brimmed hat, and sunglasses year round, whenever you are outdoors.  . Once a month, do a whole body skin exam, using a mirror to look at the skin on your back. Tell your health care provider of new moles, moles that have irregular borders, moles that are larger than a pencil eraser, or moles that have changed in shape or color.

## 2017-11-07 NOTE — Progress Notes (Signed)
Pre visit review using our clinic review tool, if applicable. No additional management support is needed unless otherwise documented below in the visit note. 

## 2017-11-07 NOTE — Progress Notes (Signed)
Subjective:   Cynthia Roberson is a 67 y.o. female who presents for Medicare Annual (Subsequent) preventive examination.  Review of Systems:  N/A Cardiac Risk Factors include: advanced age (>42men, >45 women);obesity (BMI >30kg/m2);hypertension;dyslipidemia     Objective:     Vitals: BP 110/78 (BP Location: Left Arm, Patient Position: Sitting, Cuff Size: Normal)   Pulse 70   Temp 98.5 F (36.9 C) (Oral)   Ht 5\' 2"  (1.575 m) Comment: no shoes  Wt 176 lb 12 oz (80.2 kg)   SpO2 98%   BMI 32.33 kg/m   Body mass index is 32.33 kg/m.  Advanced Directives 11/07/2017 11/06/2016  Does Patient Have a Medical Advance Directive? No No  Would patient like information on creating a medical advance directive? Yes (MAU/Ambulatory/Procedural Areas - Information given) -    Tobacco Social History   Tobacco Use  Smoking Status Never Smoker  Smokeless Tobacco Never Used     Counseling given: No   Clinical Intake:  Pre-visit preparation completed: Yes  Pain : No/denies pain Pain Score: 0-No pain     Nutritional Status: BMI > 30  Obese Nutritional Risks: None Diabetes: No  How often do you need to have someone help you when you read instructions, pamphlets, or other written materials from your doctor or pharmacy?: 1 - Never What is the last grade level you completed in school?: Masters degree  Interpreter Needed?: No  Comments: pt lives with spouse Information entered by :: LPinson, LPN  Past Medical History:  Diagnosis Date  . Clostridium difficile infection 2013  . Hirsutism   . Hyperandrogenemia   . Hypertension   . Osteopenia    DEXA 2016  . Other alopecia   . Polycystic ovaries   . Unspecified hypothyroidism    Past Surgical History:  Procedure Laterality Date  . COLONOSCOPY  2015   h/o polyps, rpt 5 yrs (Outlaw)  . TONSILLECTOMY    . WISDOM TOOTH EXTRACTION     Family History  Problem Relation Age of Onset  . Heart failure Mother   . Cancer Mother 66       lung  . Heart disease Father   . Stroke Father   . Hypertension Father    Social History   Socioeconomic History  . Marital status: Married    Spouse name: None  . Number of children: None  . Years of education: None  . Highest education level: None  Social Needs  . Financial resource strain: None  . Food insecurity - worry: None  . Food insecurity - inability: None  . Transportation needs - medical: None  . Transportation needs - non-medical: None  Occupational History  . None  Tobacco Use  . Smoking status: Never Smoker  . Smokeless tobacco: Never Used  Substance and Sexual Activity  . Alcohol use: Yes    Alcohol/week: 0.6 oz    Types: 1 Glasses of wine per week  . Drug use: No  . Sexual activity: Yes  Other Topics Concern  . None  Social History Narrative   Lives with husband Cynthia Roberson: retired, was middle Research scientist (physical sciences)    Activity: stays active caring for grandchildren   Diet: good water, fruits/vegetables daily     Outpatient Encounter Medications as of 11/07/2017  Medication Sig  . Biotin 5 MG TABS Take 5 mg by mouth daily.   . Cholecalciferol (VITAMIN D) 2000 UNITS tablet Take 4,000 Units by mouth daily.   [name] glucose blood (ONETOUCH  VERIO) test strip Use to test sugars two times weekly  . levothyroxine (SYNTHROID) 50 MCG tablet TAKE 1 TABLET BY MOUTH EVERY DAY BEFORE BREAKFAST  . loratadine (CLARITIN) 10 MG tablet Take 10 mg by mouth daily as needed.   . metFORMIN (GLUCOPHAGE-XR) 500 MG 24 hr tablet TAKE 1 TABLET(500 MG) BY MOUTH TWICE DAILY  . Minoxidil (ROGAINE MENS) 5 % FOAM Apply topically.  . NONFORMULARY OR COMPOUNDED ITEM 2 capsules 2 (two) times daily. Hydro-eye  . ONETOUCH DELICA LANCETS 77O MISC Use to test sugars 2 times weekly  . ranitidine (ZANTAC) 150 MG tablet Take 150 mg by mouth as needed.   . RESTASIS MULTIDOSE 0.05 % ophthalmic emulsion INT 1 GTT INTO OU BID UTD  . Turmeric Curcumin 500 MG CAPS Take 1 capsule by mouth daily.  .  vitamin B-12 (CYANOCOBALAMIN) 500 MCG tablet Take 500 mcg by mouth daily.  . [DISCONTINUED] nebivolol (BYSTOLIC) 2.5 MG tablet Take one tablet daily  . vitamin E 400 UNIT capsule Take 400 Units by mouth daily.  . Zinc 50 MG TABS Take 1 tablet by mouth daily.   No facility-administered encounter medications on file as of 11/07/2017.     Activities of Daily Living In your present state of health, do you have any difficulty performing the following activities: 11/07/2017  Hearing? N  Vision? N  Difficulty concentrating or making decisions? N  Walking or climbing stairs? N  Dressing or bathing? N  Doing errands, shopping? N  Preparing Food and eating ? N  Using the Toilet? N  In the past six months, have you accidently leaked urine? N  Do you have problems with loss of bowel control? N  Managing your Medications? N  Managing your Finances? N  Housekeeping or managing your Housekeeping? N  Some recent data might be hidden    Patient Care Team: Ria Bush, MD as PCP - General (Family Medicine) Philemon Kingdom, MD as Consulting Physician (Internal Medicine) Rolm Bookbinder, MD as Consulting Physician (Dermatology) Rolley Sims, B[place] as Consulting Physician (Optometry) Arta Silence, MD as Consulting Physician (Gastroenterology) Brien Few, MD as Consulting Physician (Obstetrics and Gynecology) Nyra Capes, DDS as Consulting Physician (Dentistry) Harrie Foreman, MD as Consulting Physician (Neurology)    Assessment:   This is a routine wellness examination for [place].   Hearing Screening   125Hz  250Hz  500Hz  1000Hz  2000Hz  3000Hz  4000Hz  6000Hz  8000Hz   Right ear:   40 40 40  40    Left ear:   40 40 40  40    Vision Screening Comments: 08/2017 eye exam with Dr. Rolley Sims    Exercise Activities and Dietary recommendations Current Exercise Habits: The patient does not participate in regular exercise at present, Exercise limited by: Other - see  comments(schedule/personal committments)  Goals    . Follow up with Primary Care Provider     Starting 11/07/2017, I will continue to take medications as prescribed and to keep appointments with PCP as scheduled.        Fall Risk Fall Risk  11/07/2017 11/06/2016  Falls in the past year? No No   Depression Screen PHQ 2/9 Scores 11/07/2017 11/06/2016  PHQ - 2 Score 0 0  PHQ- 9 Score 0 -     Cognitive Function MMSE - Mini Mental State Exam 11/07/2017 11/06/2016  Orientation to time 5 5  Orientation to Place 5 5  Registration 3 3  Attention/ Calculation 0 0  Recall 3 3  Language- name 2 objects 0 0  Language- repeat 1 1  Language- follow 3 step command 3 3  Language- read & follow direction 0 0  Write a sentence 0 0  Copy design 0 0  Total score 20 20     PLEASE NOTE: A Mini-Cog screen was completed. Maximum score is 20. A value of 0 denotes this part of Folstein MMSE was not completed or the patient failed this part of the Mini-Cog screening.   Mini-Cog Screening Orientation to Time - Max 5 pts Orientation to Place - Max 5 pts Registration - Max 3 pts Recall - Max 3 pts Language Repeat - Max 1 pts Language Follow 3 Step Command - Max 3 pts     Immunization History  Administered Date(s) Administered  . Influenza Split 07/11/2014  . Influenza, High Dose Seasonal PF 07/24/2017  . Influenza,inj,Quad PF,6+ Mos 07/28/2015, 07/05/2016  . Pneumococcal Conjugate-13 08/15/2015  . Pneumococcal Polysaccharide-23 11/11/2016  . Tdap 04/11/2011    Screening Tests Health Maintenance  Topic Date Due  . MAMMOGRAM  10/27/2018 (Originally 12/26/2016)  . DTaP/Tdap/Td (2 - Td) 04/10/2021  . TETANUS/TDAP  04/10/2021  . COLONOSCOPY  12/26/2021  . INFLUENZA VACCINE  Completed  . DEXA SCAN  Completed  . Hepatitis C Screening  Completed  . PNA vac Low Risk Adult  Completed         Plan:     I have personally reviewed, addressed, and noted the following in the patient's chart:   A. Medical and social history B. Use of alcohol, tobacco or illicit drugs  C. Current medications and supplements D. Functional ability and status E.  Nutritional status F.  Physical activity G. Advance directives H. List of other physicians I.  Hospitalizations, surgeries, and ER visits in previous 12 months J.  [place] to include hearing, vision, cognitive, depression L. Referrals and appointments - none  In addition, I have reviewed and discussed with patient certain preventive protocols, quality metrics, and best practice recommendations. A written personalized care plan for preventive services as well as general preventive health recommendations were provided to patient.  See attached scanned questionnaire for additional information.   Signed,   Lindell Noe, MHA, BS, LPN Health Coach

## 2017-11-07 NOTE — Telephone Encounter (Signed)
Notified pt refill done per protocol. Pt voiced understanding.

## 2017-11-07 NOTE — Progress Notes (Signed)
PCP notes:   Health maintenance:  Mammogram - addressed  Abnormal screenings:   None  Patient concerns:   A. Cough along with painful jaw and ear B. Dry eyes - could she possibly have rheumatoid arthritis? C. Intermittent right hip pain D. Hump on upper back - could it be fat or spine issue? E. Increased fatigue and mild depression at times F. Sleep management - unable to stay asleep; wakes 2-3 times per night G. "Cricket" sounds in both ears  Nurse concerns:  None  Next PCP appt:   11/14/17 @ 0930

## 2017-11-09 NOTE — Progress Notes (Signed)
I reviewed health advisor's note, was available for consultation, and agree with documentation and plan.  

## 2017-11-14 ENCOUNTER — Ambulatory Visit (INDEPENDENT_AMBULATORY_CARE_PROVIDER_SITE_OTHER): Payer: Medicare Other | Admitting: Family Medicine

## 2017-11-14 ENCOUNTER — Encounter: Payer: Self-pay | Admitting: Family Medicine

## 2017-11-14 VITALS — BP 120/68 | HR 74 | Temp 98.0°F | Ht 62.0 in | Wt 179.0 lb

## 2017-11-14 DIAGNOSIS — Z7189 Other specified counseling: Secondary | ICD-10-CM | POA: Diagnosis not present

## 2017-11-14 DIAGNOSIS — D751 Secondary polycythemia: Secondary | ICD-10-CM

## 2017-11-14 DIAGNOSIS — H6981 Other specified disorders of Eustachian tube, right ear: Secondary | ICD-10-CM | POA: Insufficient documentation

## 2017-11-14 DIAGNOSIS — E282 Polycystic ovarian syndrome: Secondary | ICD-10-CM

## 2017-11-14 DIAGNOSIS — M8580 Other specified disorders of bone density and structure, unspecified site: Secondary | ICD-10-CM

## 2017-11-14 DIAGNOSIS — R7303 Prediabetes: Secondary | ICD-10-CM

## 2017-11-14 DIAGNOSIS — I10 Essential (primary) hypertension: Secondary | ICD-10-CM | POA: Diagnosis not present

## 2017-11-14 DIAGNOSIS — E039 Hypothyroidism, unspecified: Secondary | ICD-10-CM

## 2017-11-14 DIAGNOSIS — E785 Hyperlipidemia, unspecified: Secondary | ICD-10-CM | POA: Diagnosis not present

## 2017-11-14 DIAGNOSIS — H6991 Unspecified Eustachian tube disorder, right ear: Secondary | ICD-10-CM

## 2017-11-14 DIAGNOSIS — Z0000 Encounter for general adult medical examination without abnormal findings: Secondary | ICD-10-CM

## 2017-11-14 NOTE — Assessment & Plan Note (Signed)
Chronic, stable. Continue current regimen. 

## 2017-11-14 NOTE — Assessment & Plan Note (Signed)
Chronic, off meds. The 10-year ASCVD risk score Mikey Bussing DC Brooke Bonito., et al., 2013) is: 8.2%   Values used to calculate the score:     Age: 67 years     Sex: Female     Is Non-Hispanic African American: No     Diabetic: No     Tobacco smoker: No     Systolic Blood Pressure: 628 mmHg     Is BP treated: Yes     HDL Cholesterol: 66.7 mg/dL     Total Cholesterol: 238 mg/dL

## 2017-11-14 NOTE — Assessment & Plan Note (Signed)
Chronic, stable. Followed by endo.

## 2017-11-14 NOTE — Assessment & Plan Note (Signed)
Chronic, stable on metformin ER for PCOS.

## 2017-11-14 NOTE — Assessment & Plan Note (Signed)
Continue calcium and vitamin D and weight bearing exercises.

## 2017-11-14 NOTE — Assessment & Plan Note (Signed)
Benign exam. Encouraged flonase, nasal saline

## 2017-11-14 NOTE — Assessment & Plan Note (Signed)
?  dehydration related - given ongoing will refer to hematology.

## 2017-11-14 NOTE — Assessment & Plan Note (Signed)
Advanced directive discussion -received advanced directive packet last month - working on this.husband would be HCPOA then daughter Heather.  

## 2017-11-14 NOTE — Assessment & Plan Note (Signed)
Preventative protocols reviewed and updated unless pt declined. Discussed healthy diet and lifestyle.  

## 2017-11-14 NOTE — Patient Instructions (Addendum)
We will refer you to hematologist for evaluation of elevated red cells.  If interested, check with pharmacy about new 2 shot shingles series (shingrix).  Work on Financial controller.  Possible eustachian tube dysfunction - continue flonase and nasal saline.  Good to see you today Return as needed or in 6 months for follow up visit.  Health Maintenance, Female Adopting a healthy lifestyle and getting preventive care can go a long way to promote health and wellness. Talk with your health care provider about what schedule of regular examinations is right for you. This is a good chance for you to check in with your provider about disease prevention and staying healthy. In between checkups, there are plenty of things you can do on your own. Experts have done a lot of research about which lifestyle changes and preventive measures are most likely to keep you healthy. Ask your health care provider for more information. Weight and diet Eat a healthy diet  Be sure to include plenty of vegetables, fruits, low-fat dairy products, and lean protein.  Do not eat a lot of foods high in solid fats, added sugars, or salt.  Get regular exercise. This is one of the most important things you can do for your health. ? Most adults should exercise for at least 150 minutes each week. The exercise should increase your heart rate and make you sweat (moderate-intensity exercise). ? Most adults should also do strengthening exercises at least twice a week. This is in addition to the moderate-intensity exercise.  Maintain a healthy weight  Body mass index (BMI) is a measurement that can be used to identify possible weight problems. It estimates body fat based on height and weight. Your health care provider can help determine your BMI and help you achieve or maintain a healthy weight.  For females 27 years of age and older: ? A BMI below 18.5 is considered underweight. ? A BMI of 18.5 to 24.9 is normal. ? A BMI of 25 to  29.9 is considered overweight. ? A BMI of 30 and above is considered obese.  Watch levels of cholesterol and blood lipids  You should start having your blood tested for lipids and cholesterol at 68 years of age, then have this test every 5 years.  You may need to have your cholesterol levels checked more often if: ? Your lipid or cholesterol levels are high. ? You are older than 68 years of age. ? You are at high risk for heart disease.  Cancer screening Lung Cancer  Lung cancer screening is recommended for adults 48-83 years old who are at high risk for lung cancer because of a history of smoking.  A yearly low-dose CT scan of the lungs is recommended for people who: ? Currently smoke. ? Have quit within the past 15 years. ? Have at least a 30-pack-year history of smoking. A pack year is smoking an average of one pack of cigarettes a day for 1 year.  Yearly screening should continue until it has been 15 years since you quit.  Yearly screening should stop if you develop a health problem that would prevent you from having lung cancer treatment.  Breast Cancer  Practice breast self-awareness. This means understanding how your breasts normally appear and feel.  It also means doing regular breast self-exams. Let your health care provider know about any changes, no matter how small.  If you are in your 20s or 30s, you should have a clinical breast exam (CBE) by a health  care provider every 1-3 years as part of a regular health exam.  If you are 93 or older, have a CBE every year. Also consider having a breast X-ray (mammogram) every year.  If you have a family history of breast cancer, talk to your health care provider about genetic screening.  If you are at high risk for breast cancer, talk to your health care provider about having an MRI and a mammogram every year.  Breast cancer gene (BRCA) assessment is recommended for women who have family members with BRCA-related cancers.  BRCA-related cancers include: ? Breast. ? Ovarian. ? Tubal. ? Peritoneal cancers.  Results of the assessment will determine the need for genetic counseling and BRCA1 and BRCA2 testing.  Cervical Cancer Your health care provider may recommend that you be screened regularly for cancer of the pelvic organs (ovaries, uterus, and vagina). This screening involves a pelvic examination, including checking for microscopic changes to the surface of your cervix (Pap test). You may be encouraged to have this screening done every 3 years, beginning at age 83.  For women ages 35-65, health care providers may recommend pelvic exams and Pap testing every 3 years, or they may recommend the Pap and pelvic exam, combined with testing for human papilloma virus (HPV), every 5 years. Some types of HPV increase your risk of cervical cancer. Testing for HPV may also be done on women of any age with unclear Pap test results.  Other health care providers may not recommend any screening for nonpregnant women who are considered low risk for pelvic cancer and who do not have symptoms. Ask your health care provider if a screening pelvic exam is right for you.  If you have had past treatment for cervical cancer or a condition that could lead to cancer, you need Pap tests and screening for cancer for at least 20 years after your treatment. If Pap tests have been discontinued, your risk factors (such as having a new sexual partner) need to be reassessed to determine if screening should resume. Some women have medical problems that increase the chance of getting cervical cancer. In these cases, your health care provider may recommend more frequent screening and Pap tests.  Colorectal Cancer  This type of cancer can be detected and often prevented.  Routine colorectal cancer screening usually begins at 68 years of age and continues through 68 years of age.  Your health care provider may recommend screening at an earlier age if  you have risk factors for colon cancer.  Your health care provider may also recommend using home test kits to check for hidden blood in the stool.  A small camera at the end of a tube can be used to examine your colon directly (sigmoidoscopy or colonoscopy). This is done to check for the earliest forms of colorectal cancer.  Routine screening usually begins at age 26.  Direct examination of the colon should be repeated every 5-10 years through 68 years of age. However, you may need to be screened more often if early forms of precancerous polyps or small growths are found.  Skin Cancer  Check your skin from head to toe regularly.  Tell your health care provider about any new moles or changes in moles, especially if there is a change in a mole's shape or color.  Also tell your health care provider if you have a mole that is larger than the size of a pencil eraser.  Always use sunscreen. Apply sunscreen liberally and repeatedly throughout the  day.  Protect yourself by wearing long sleeves, pants, a wide-brimmed hat, and sunglasses whenever you are outside.  Heart disease, diabetes, and high blood pressure  High blood pressure causes heart disease and increases the risk of stroke. High blood pressure is more likely to develop in: ? People who have blood pressure in the high end of the normal range (130-139/85-89 mm Hg). ? People who are overweight or obese. ? People who are African American.  If you are 47-77 years of age, have your blood pressure checked every 3-5 years. If you are 6 years of age or older, have your blood pressure checked every year. You should have your blood pressure measured twice-once when you are at a hospital or clinic, and once when you are not at a hospital or clinic. Record the average of the two measurements. To check your blood pressure when you are not at a hospital or clinic, you can use: ? An automated blood pressure machine at a pharmacy. ? A home blood  pressure monitor.  If you are between 38 years and 43 years old, ask your health care provider if you should take aspirin to prevent strokes.  Have regular diabetes screenings. This involves taking a blood sample to check your fasting blood sugar level. ? If you are at a normal weight and have a low risk for diabetes, have this test once every three years after 68 years of age. ? If you are overweight and have a high risk for diabetes, consider being tested at a younger age or more often. Preventing infection Hepatitis B  If you have a higher risk for hepatitis B, you should be screened for this virus. You are considered at high risk for hepatitis B if: ? You were born in a country where hepatitis B is common. Ask your health care provider which countries are considered high risk. ? Your parents were born in a high-risk country, and you have not been immunized against hepatitis B (hepatitis B vaccine). ? You have HIV or AIDS. ? You use needles to inject street drugs. ? You live with someone who has hepatitis B. ? You have had sex with someone who has hepatitis B. ? You get hemodialysis treatment. ? You take certain medicines for conditions, including cancer, organ transplantation, and autoimmune conditions.  Hepatitis C  Blood testing is recommended for: ? Everyone born from [birth year] through 1966. ? Anyone with known risk factors for hepatitis C.  Sexually transmitted infections (STIs)  You should be screened for sexually transmitted infections (STIs) including gonorrhea and chlamydia if: ? You are sexually active and are younger than 67 years of age. ? You are older than 68 years of age and your health care provider tells you that you are at risk for this type of infection. ? Your sexual activity has changed since you were last screened and you are at an increased risk for chlamydia or gonorrhea. Ask your health care provider if you are at risk.  If you do not have HIV, but are at risk,  it may be recommended that you take a prescription medicine daily to prevent HIV infection. This is called pre-exposure prophylaxis (PrEP). You are considered at risk if: ? You are sexually active and do not regularly use condoms or know the HIV status of your partner(s). ? You take drugs by injection. ? You are sexually active with a partner who has HIV.  Talk with your health care provider about whether you are at high  risk of being infected with HIV. If you choose to begin PrEP, you should first be tested for HIV. You should then be tested every 3 months for as long as you are taking PrEP. Pregnancy  If you are premenopausal and you may become pregnant, ask your health care provider about preconception counseling.  If you may become pregnant, take 400 to 800 micrograms (mcg) of folic acid every day.  If you want to prevent pregnancy, talk to your health care provider about birth control (contraception). Osteoporosis and menopause  Osteoporosis is a disease in which the bones lose minerals and strength with aging. This can result in serious bone fractures. Your risk for osteoporosis can be identified using a bone density scan.  If you are 57 years of age or older, or if you are at risk for osteoporosis and fractures, ask your health care provider if you should be screened.  Ask your health care provider whether you should take a calcium or vitamin D supplement to lower your risk for osteoporosis.  Menopause may have certain physical symptoms and risks.  Hormone replacement therapy may reduce some of these symptoms and risks. Talk to your health care provider about whether hormone replacement therapy is right for you. Follow these instructions at home:  Schedule regular health, dental, and eye exams.  Stay current with your immunizations.  Do not use any tobacco products including cigarettes, chewing tobacco, or electronic cigarettes.  If you are pregnant, do not drink  alcohol.  If you are breastfeeding, limit how much and how often you drink alcohol.  Limit alcohol intake to no more than 1 drink per day for nonpregnant women. One drink equals 12 ounces of beer, 5 ounces of wine, or 1 ounces of hard liquor.  Do not use street drugs.  Do not share needles.  Ask your health care provider for help if you need support or information about quitting drugs.  Tell your health care provider if you often feel depressed.  Tell your health care provider if you have ever been abused or do not feel safe at home. This information is not intended to replace advice given to you by your health care provider. Make sure you discuss any questions you have with your health care provider. Document Released: 04/29/2011 Document Revised: 03/21/2016 Document Reviewed: 07/18/2015 Elsevier Interactive Patient Education  [employer].

## 2017-11-14 NOTE — Progress Notes (Signed)
BP 120/68 (BP Location: Left Arm, Patient Position: Sitting, Cuff Size: Normal)   Pulse 74   Temp 98 F (36.7 C) (Oral)   Ht 5\' 2"  (1.575 m)   Wt 179 lb (81.2 kg)   SpO2 97%   BMI 32.74 kg/m    CC: CPE Subjective:    Patient ID: Cynthia Roberson, female    DOB: June 02, 1951, 67 y.o.   MRN: [id number]  HPI: Cynthia Roberson is a 67 y.o. female presenting on 11/14/2017 for Annual Exam (Pt 2)   Recent head cold.  Sees endo for PCOS, hyperandrogenism, hypothyroidism.  Saw Lesia last week for medicare wellness visit. Note reviewed.    Preventative: COLONOSCOPY 2015 h/o polyps, rpt 5 yrs (Outlaw)  Lung cancer screening - not eligible  Well woman - sees Dr Ronita Hipps yearly  Breast cancer screening - at [facility] office  DEXA - h/o osteopenia, unsure date - followed by OBGYN  Flu shot - yearly Tdap 2012 prevnar 2016, pneumovax 2018 zostavax - done at [facility]  shingrix - discussed Advanced directive discussion - received advanced directive packet last month - working on this. husband would be HCPOA then daughter [name].  Seat belt use discussed Sunscreen use discussed, no changing moles on skin - sees derm yearly Gean Quint) Nonsmoker. + second hand smoke. Alcohol - rare   Lives with husband [name] Occ: retired, was middle Research scientist (physical sciences)  Activity: stays active caring for grandchildren Diet: good water, fruits/vegetables daily   Relevant past medical, surgical, family and social history reviewed and updated as indicated. Interim medical history since our last visit reviewed. Allergies and medications reviewed and updated. Outpatient Medications Prior to Visit  Medication Sig Dispense Refill  . Biotin 5 MG TABS Take 5 mg by mouth daily.     . Calcium Carbonate 500 MG CHEW Chew 1 tablet by mouth daily.    . Cholecalciferol (VITAMIN D) 2000 UNITS tablet Take 4,000 Units by mouth daily.     . fluticasone (FLONASE) 50 MCG/ACT nasal spray Place 2 sprays into both nostrils daily.    [name]  glucose blood (ONETOUCH VERIO) test strip Use to test sugars two times weekly 30 each 12  . levothyroxine (SYNTHROID) 50 MCG tablet TAKE 1 TABLET BY MOUTH EVERY DAY BEFORE BREAKFAST 90 tablet 1  . loratadine (CLARITIN) 10 MG tablet Take 10 mg by mouth daily as needed.     . metFORMIN (GLUCOPHAGE-XR) 500 MG 24 hr tablet TAKE 1 TABLET(500 MG) BY MOUTH TWICE DAILY 180 tablet 1  . Minoxidil (ROGAINE MENS) 5 % FOAM Apply topically.    . nebivolol (BYSTOLIC) 2.5 MG tablet Take one tablet daily 90 tablet 0  . NONFORMULARY OR COMPOUNDED ITEM 2 capsules 2 (two) times daily. Hydro-eye    . ONETOUCH DELICA LANCETS 09T MISC Use to test sugars 2 times weekly 30 each 11  . ranitidine (ZANTAC) 150 MG tablet Take 150 mg by mouth as needed.     . RESTASIS MULTIDOSE 0.05 % ophthalmic emulsion INT 1 GTT INTO OU BID UTD  3  . Turmeric Curcumin 500 MG CAPS Take 1 capsule by mouth daily.    . vitamin B-12 (CYANOCOBALAMIN) 500 MCG tablet Take 500 mcg by mouth daily.    . vitamin E 400 UNIT capsule Take 400 Units by mouth daily.    . Zinc 50 MG TABS Take 1 tablet by mouth daily.     No facility-administered medications prior to visit.      Per HPI unless specifically  indicated in ROS section below Review of Systems  Constitutional: Positive for chills (recent head cold). Negative for activity change, appetite change, fatigue, fever and unexpected weight change.  HENT: Positive for congestion and ear pain (mild on right). Negative for hearing loss.   Eyes: Negative for visual disturbance.  Respiratory: Positive for cough. Negative for chest tightness, shortness of breath and wheezing.   Cardiovascular: Negative for chest pain, palpitations and leg swelling.  Gastrointestinal: Negative for abdominal distention, abdominal pain, blood in stool, constipation, diarrhea, nausea and vomiting.  Genitourinary: Negative for difficulty urinating and hematuria.  Musculoskeletal: Negative for arthralgias, myalgias and neck  pain.  Skin: Negative for rash.  Neurological: Negative for dizziness, seizures, syncope and headaches.  Hematological: Negative for adenopathy. Does not bruise/bleed easily.  Psychiatric/Behavioral: Positive for dysphoric mood (mild). The patient is not nervous/anxious.        Objective:    BP 120/68 (BP Location: Left Arm, Patient Position: Sitting, Cuff Size: Normal)   Pulse 74   Temp 98 F (36.7 C) (Oral)   Ht 5\' 2"  (1.575 m)   Wt 179 lb (81.2 kg)   SpO2 97%   BMI 32.74 kg/m   Wt Readings from Last 3 Encounters:  11/14/17 179 lb (81.2 kg)  11/07/17 176 lb 12 oz (80.2 kg)  07/24/17 174 lb (78.9 kg)    Physical Exam  Constitutional: She is oriented to person, place, and time. She appears well-developed and well-nourished. No distress.  HENT:  Head: Normocephalic and atraumatic.  Right Ear: Hearing, tympanic membrane, external ear and ear canal normal.  Left Ear: Hearing, tympanic membrane, external ear and ear canal normal.  Nose: Mucosal edema (R>L) present. No rhinorrhea.  Mouth/Throat: Uvula is midline, oropharynx is clear and moist and mucous membranes are normal. No oropharyngeal exudate, posterior oropharyngeal edema or posterior oropharyngeal erythema.  Eyes: Conjunctivae and EOM are normal. Pupils are equal, round, and reactive to light. No scleral icterus.  Neck: Normal range of motion. Neck supple. Carotid bruit is not present. No thyromegaly present.  Cardiovascular: Normal rate, regular rhythm, normal heart sounds and intact distal pulses.  No murmur heard. Pulses:      Radial pulses are 2+ on the right side, and 2+ on the left side.  Pulmonary/Chest: Effort normal and breath sounds normal. No respiratory distress. She has no wheezes. She has no rales.  Abdominal: Soft. Bowel sounds are normal. She exhibits no distension and no mass. There is no tenderness. There is no rebound and no guarding.  Musculoskeletal: Normal range of motion. She exhibits no edema.    Lymphadenopathy:    She has no cervical adenopathy.  Neurological: She is alert and oriented to person, place, and time.  CN grossly intact, station and gait intact  Skin: Skin is warm and dry. No rash noted.  Psychiatric: She has a normal mood and affect. Her behavior is normal. Judgment and thought content normal.  Nursing note and vitals reviewed.  Results for orders placed or performed in visit on 11/07/17  CBC with Differential/Platelet  Result Value Ref Range   WBC 5.3 4.0 - 10.5 K/uL   RBC 6.08 (H) 3.87 - 5.11 Mil/uL   Hemoglobin 15.4 (H) 12.0 - 15.0 g/dL   HCT 48.8 (H) 36.0 - 46.0 %   MCV 80.2 78.0 - 100.0 fl   MCHC 31.6 30.0 - 36.0 g/dL   RDW 14.4 11.5 - 15.5 %   Platelets 265.0 150.0 - 400.0 K/uL   Neutrophils Relative % 52.5  43.0 - 77.0 %   Lymphocytes Relative 37.1 12.0 - 46.0 %   Monocytes Relative 7.9 3.0 - 12.0 %   Eosinophils Relative 1.9 0.0 - 5.0 %   Basophils Relative 0.6 0.0 - 3.0 %   Neutro Abs 2.8 1.4 - 7.7 K/uL   Lymphs Abs 2.0 0.7 - 4.0 K/uL   Monocytes Absolute 0.4 0.1 - 1.0 K/uL   Eosinophils Absolute 0.1 0.0 - 0.7 K/uL   Basophils Absolute 0.0 0.0 - 0.1 K/uL  Comprehensive metabolic panel  Result Value Ref Range   Sodium 136 135 - 145 mEq/L   Potassium 4.7 3.5 - 5.1 mEq/L   Chloride 98 96 - 112 mEq/L   CO2 32 19 - 32 mEq/L   Glucose, Bld 104 (H) 70 - 99 mg/dL   BUN 16 6 - 23 mg/dL   Creatinine, Ser 0.95 0.40 - 1.20 mg/dL   Total Bilirubin 0.5 0.2 - 1.2 mg/dL   Alkaline Phosphatase 55 39 - 117 U/L   AST 21 0 - 37 U/L   ALT 29 0 - 35 U/L   Total Protein 7.4 6.0 - 8.3 g/dL   Albumin 4.6 3.5 - 5.2 g/dL   Calcium 9.9 8.4 - 10.5 mg/dL   GFR 62.24 >60.00 mL/min  Lipid panel  Result Value Ref Range   Cholesterol 238 (H) 0 - 200 mg/dL   Triglycerides 122.0 0.0 - 149.0 mg/dL   HDL 66.70 >39.00 mg/dL   VLDL 24.4 0.0 - 40.0 mg/dL   LDL Cholesterol 147 (H) 0 - 99 mg/dL   Total CHOL/HDL Ratio 4    NonHDL 170.94   Hemoglobin A1c  Result Value Ref  Range   Hgb A1c MFr Bld 5.9 4.6 - 6.5 %  T4, free  Result Value Ref Range   Free T4 1.05 0.60 - 1.60 ng/dL  TSH  Result Value Ref Range   TSH 1.87 0.35 - 4.50 uIU/mL      Assessment & Plan:   Problem List Items Addressed This Visit    Advanced care planning/counseling discussion    Advanced directive discussion - received advanced directive packet last month - working on this. husband would be HCPOA then daughter [name].       Dyslipidemia    Chronic, off meds. The 10-year ASCVD risk score Mikey Bussing DC Brooke Bonito., et al., 2013) is: 8.2%   Values used to calculate the score:     Age: 11 years     Sex: Female     Is Non-Hispanic African American: No     Diabetic: No     Tobacco smoker: No     Systolic Blood Pressure: 098 mmHg     Is BP treated: Yes     HDL Cholesterol: 66.7 mg/dL     Total Cholesterol: 238 mg/dL       Essential hypertension, benign    Chronic, stable. Continue current regimen.       ETD (Eustachian tube dysfunction), right    Benign exam. Encouraged flonase, nasal saline      Hypothyroidism    Chronic, stable. Followed by endo.       Osteopenia    Continue calcium and vitamin D and weight bearing exercises.       PCOS (polycystic ovarian syndrome)   Polycythemia, secondary    ?dehydration related - given ongoing will refer to hematology.       Relevant Orders   Ambulatory referral to Hematology   Prediabetes    Chronic, stable on metformin  ER for PCOS.       Routine general medical examination at a health care facility - Primary    Preventative protocols reviewed and updated unless pt declined. Discussed healthy diet and lifestyle.           Follow up plan: Return in about 6 months (around 05/14/2018) for follow up visit.  Ria Bush, MD

## 2017-11-18 ENCOUNTER — Telehealth: Payer: Self-pay | Admitting: Hematology and Oncology

## 2017-11-18 ENCOUNTER — Encounter: Payer: Self-pay | Admitting: Hematology and Oncology

## 2017-11-18 NOTE — Telephone Encounter (Signed)
Appt has been scheduled for the pt to see Dr. Alvy Bimler on 2/12 at 1015am. Pt aware to arrive 30 minutes early. Address verified. Letter mailed and referring notified.

## 2017-12-09 ENCOUNTER — Telehealth: Payer: Self-pay | Admitting: Hematology and Oncology

## 2017-12-09 ENCOUNTER — Encounter: Payer: Self-pay | Admitting: Hematology and Oncology

## 2017-12-09 ENCOUNTER — Inpatient Hospital Stay: Payer: Medicare Other | Attending: Hematology and Oncology | Admitting: Hematology and Oncology

## 2017-12-09 VITALS — BP 138/90 | HR 59 | Temp 97.9°F | Resp 18 | Ht 62.0 in | Wt 177.5 lb

## 2017-12-09 DIAGNOSIS — E282 Polycystic ovarian syndrome: Secondary | ICD-10-CM

## 2017-12-09 DIAGNOSIS — E039 Hypothyroidism, unspecified: Secondary | ICD-10-CM

## 2017-12-09 DIAGNOSIS — D751 Secondary polycythemia: Secondary | ICD-10-CM | POA: Diagnosis not present

## 2017-12-09 DIAGNOSIS — E669 Obesity, unspecified: Secondary | ICD-10-CM | POA: Diagnosis not present

## 2017-12-09 NOTE — Progress Notes (Signed)
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 female is here because of elevated hemoglobin.  She was found to have abnormal CBC from routine blood count monitoring through her primary care physician's office. I have the opportunity to review a CBC dated back several years. Her hemoglobin fluctuated between 14.8-16.2. 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 She denies intermittent headaches, shortness of breath on exertion, frequent leg cramps and occasional chest pain.  She never suffer from diagnosis of blood clot.  There is no prior diagnosis of obstructive sleep apnea, although her husband has noted her snoring. The patient does not smoke.  She denies diagnosis of COPD. She complained of chronic excessive fatigue.  She have high stressful situation despite being retired due to her helping her daughter with her grandchildren. She has gained a lot of weight over the past few years.  MEDICAL HISTORY:  Past Medical History:  Diagnosis Date  . Clostridium  difficile infection 2013  . Hirsutism   . Hyperandrogenemia   . Hypertension   . Osteopenia    DEXA 2016  . Other alopecia   . Polycystic ovaries   . Unspecified hypothyroidism     SURGICAL HISTORY: Past Surgical History:  Procedure Laterality Date  . COLONOSCOPY  2015   h/o polyps, rpt 5 yrs (Outlaw)  . TONSILLECTOMY    . WISDOM TOOTH EXTRACTION      SOCIAL HISTORY: Social History   Socioeconomic History  . Marital status: Married    Spouse name: [name]  . Number of children: Not on file  . Years of education: Not on file  . Highest education level: Not on file  Social Needs  . Financial resource strain: Not on file  . Food insecurity - worry: Not on file  . Food insecurity - inability: Not on file  . Transportation needs - medical: Not on file  . Transportation needs - non-medical: Not on file  Occupational History  . Not on file  Tobacco Use  . Smoking status: Never Smoker  . Smokeless tobacco: Never Used  Substance and Sexual Activity  . Alcohol use: Yes    Alcohol/week: 0.6 oz    Types: 1 Glasses of wine per week  . Drug use: No  . Sexual activity: Yes  Other Topics Concern  . Not on file  Social History Narrative   Lives with husband [name]   Occ: retired, was middle Research scientist (physical sciences)    Activity: stays active caring for grandchildren   Diet: good water, fruits/vegetables daily     FAMILY HISTORY: Family History  Problem Relation Age of Onset  . Heart failure Mother   . Cancer Mother 29       lung  . Heart disease Father   . Stroke  Father   . Hypertension Father     ALLERGIES:  is allergic to amoxicillin; levothyroxine; statins; and sulfa antibiotics.  MEDICATIONS:  Current Outpatient Medications  Medication Sig Dispense Refill  . Biotin 5 MG TABS Take 5 mg by mouth daily.     . Calcium Carbonate 500 MG CHEW Chew 1 tablet by mouth daily.    . Cholecalciferol (VITAMIN D) 2000 UNITS tablet Take 4,000 Units by mouth daily.     . fluticasone  (FLONASE) 50 MCG/ACT nasal spray Place 2 sprays into both nostrils daily.    [name] glucose blood (ONETOUCH VERIO) test strip Use to test sugars two times weekly 30 each 12  . levothyroxine (SYNTHROID) 50 MCG tablet TAKE 1 TABLET BY MOUTH EVERY DAY BEFORE BREAKFAST 90 tablet 1  . loratadine (CLARITIN) 10 MG tablet Take 10 mg by mouth daily as needed.     . metFORMIN (GLUCOPHAGE-XR) 500 MG 24 hr tablet TAKE 1 TABLET(500 MG) BY MOUTH TWICE DAILY 180 tablet 1  . Minoxidil (ROGAINE MENS) 5 % FOAM Apply topically.    . nebivolol (BYSTOLIC) 2.5 MG tablet Take one tablet daily 90 tablet 0  . NONFORMULARY OR COMPOUNDED ITEM 2 capsules 2 (two) times daily. Hydro-eye    . ONETOUCH DELICA LANCETS 10F MISC Use to test sugars 2 times weekly 30 each 11  . ranitidine (ZANTAC) 150 MG tablet Take 150 mg by mouth as needed.     . RESTASIS MULTIDOSE 0.05 % ophthalmic emulsion INT 1 GTT INTO OU BID UTD  3  . Turmeric Curcumin 500 MG CAPS Take 1 capsule by mouth daily.    . vitamin B-12 (CYANOCOBALAMIN) 500 MCG tablet Take 500 mcg by mouth daily.    . vitamin E 400 UNIT capsule Take 400 Units by mouth daily.    . Zinc 50 MG TABS Take 1 tablet by mouth daily.     No current facility-administered medications for this visit.     REVIEW OF SYSTEMS:   Constitutional: Denies fevers, chills or abnormal night sweats Eyes: Denies blurriness of vision, double vision or watery eyes Ears, nose, mouth, throat, and face: Denies mucositis or sore throat Respiratory: Denies cough, dyspnea or wheezes Cardiovascular: Denies palpitation, chest discomfort or lower extremity swelling Gastrointestinal:  Denies nausea, heartburn or change in bowel habits Skin: Denies abnormal skin rashes Lymphatics: Denies new lymphadenopathy or easy bruising Neurological:Denies numbness, tingling or new weaknesses Behavioral/Psych: Mood is stable, no new changes  All other systems were reviewed with the patient and are negative.  PHYSICAL  EXAMINATION: ECOG PERFORMANCE STATUS: 1 - Symptomatic but completely ambulatory  Vitals:   12/09/17 1056  BP: 138/90  Pulse: (!) 59  Resp: 18  Temp: 97.9 F (36.6 C)  SpO2: 100%   Filed Weights   12/09/17 1056  Weight: 177 lb 8 oz (80.5 kg)    GENERAL:alert, no distress and comfortable.  She is moderately obese with noted central obesity on her abdomen SKIN: skin color, texture, turgor are normal, no rashes or significant lesions.  Hirsutism is noted EYES: normal, conjunctiva are pink and non-injected, sclera clear OROPHARYNX:no exudate, no erythema and lips, buccal mucosa, and tongue normal  NECK: supple, thyroid normal size, non-tender, without nodularity LYMPH:  no palpable lymphadenopathy in the cervical, axillary or inguinal LUNGS: clear to auscultation and percussion with normal breathing effort HEART: regular rate & rhythm and no murmurs and no lower extremity edema ABDOMEN:abdomen soft, non-tender and normal bowel sounds Musculoskeletal:no cyanosis of digits and no clubbing  PSYCH: alert & oriented x 3 with fluent speech NEURO: no focal motor/sensory deficits  LABORATORY DATA:  I have reviewed the data as listed Recent Results (from the past 2160 hour(s))  CBC with Differential/Platelet     Status: Abnormal   Collection Time: 11/07/17  9:04 AM  Result Value Ref Range   WBC 5.3 4.0 - 10.5 K/uL   RBC 6.08 (H) 3.87 - 5.11 Mil/uL   Hemoglobin 15.4 (H) 12.0 - 15.0 g/dL   HCT 48.8 (H) 36.0 - 46.0 %   MCV 80.2 78.0 - 100.0 fl   MCHC 31.6 30.0 - 36.0 g/dL   RDW 14.4 11.5 - 15.5 %   Platelets 265.0 150.0 - 400.0 K/uL   Neutrophils Relative % 52.5 43.0 - 77.0 %   Lymphocytes Relative 37.1 12.0 - 46.0 %   Monocytes Relative 7.9 3.0 - 12.0 %   Eosinophils Relative 1.9 0.0 - 5.0 %   Basophils Relative 0.6 0.0 - 3.0 %   Neutro Abs 2.8 1.4 - 7.7 K/uL   Lymphs Abs 2.0 0.7 - 4.0 K/uL   Monocytes Absolute 0.4 0.1 - 1.0 K/uL   Eosinophils Absolute 0.1 0.0 - 0.7 K/uL    Basophils Absolute 0.0 0.0 - 0.1 K/uL  Comprehensive metabolic panel     Status: Abnormal   Collection Time: 11/07/17  9:04 AM  Result Value Ref Range   Sodium 136 135 - 145 mEq/L   Potassium 4.7 3.5 - 5.1 mEq/L   Chloride 98 96 - 112 mEq/L   CO2 32 19 - 32 mEq/L   Glucose, Bld 104 (H) 70 - 99 mg/dL   BUN 16 6 - 23 mg/dL   Creatinine, Ser 0.95 0.40 - 1.20 mg/dL   Total Bilirubin 0.5 0.2 - 1.2 mg/dL   Alkaline Phosphatase 55 39 - 117 U/L   AST 21 0 - 37 U/L   ALT 29 0 - 35 U/L   Total Protein 7.4 6.0 - 8.3 g/dL   Albumin 4.6 3.5 - 5.2 g/dL   Calcium 9.9 8.4 - 10.5 mg/dL   GFR 62.24 >60.00 mL/min  Lipid panel     Status: Abnormal   Collection Time: 11/07/17  9:04 AM  Result Value Ref Range   Cholesterol 238 (H) 0 - 200 mg/dL    Comment: ATP III Classification       Desirable:  < 200 mg/dL               Borderline High:  200 - 239 mg/dL          High:  > = 240 mg/dL   Triglycerides 122.0 0.0 - 149.0 mg/dL    Comment: Normal:  <150 mg/dLBorderline High:  150 - 199 mg/dL   HDL 66.70 >39.00 mg/dL   VLDL 24.4 0.0 - 40.0 mg/dL   LDL Cholesterol 147 (H) 0 - 99 mg/dL   Total CHOL/HDL Ratio 4     Comment:                Men          Women1/2 Average Risk     3.4          3.3Average Risk          5.0          4.42X Average Risk          9.6          7.13X Average Risk  15.0          11.0                       NonHDL 170.94     Comment: NOTE:  Non-HDL goal should be 30 mg/dL higher than patient's LDL goal (i.e. LDL goal of < 70 mg/dL, would have non-HDL goal of < 100 mg/dL)  Hemoglobin A1c     Status: None   Collection Time: 11/07/17  9:04 AM  Result Value Ref Range   Hgb A1c MFr Bld 5.9 4.6 - 6.5 %    Comment: Glycemic Control Guidelines for People with Diabetes:Non Diabetic:  <6%Goal of Therapy: <7%Additional Action Suggested:  >8%   T4, free     Status: None   Collection Time: 11/07/17  9:04 AM  Result Value Ref Range   Free T4 1.05 0.60 - 1.60 ng/dL    Comment: Specimens from  patients who are undergoing biotin therapy and /or ingesting biotin supplements may contain high levels of biotin.  The higher biotin concentration in these specimens interferes with this Free T4 assay.  Specimens that contain high levels  of biotin may cause false high results for this Free T4 assay.  Please interpret results in light of the total clinical presentation of the patient.    TSH     Status: None   Collection Time: 11/07/17  9:04 AM  Result Value Ref Range   TSH 1.87 0.35 - 4.50 uIU/mL    RADIOGRAPHIC STUDIES: I have reviewed her prior CT imaging I have personally reviewed the radiological images as listed and agreed with the findings in the report.   ASSESSMENT & PLAN Polycythemia, secondary Her primary care physician has already completed the workup The most likely cause of her intermittent polycythemia is likely secondary to high circulating testosterone level She does not need further workup Due to potential risk of thrombosis, I recommend 81 mg aspirin therapy, to be taken with food I plan to recheck blood work in 3 months, along with dietary change and lifestyle modification for further review   PCOS (polycystic ovarian syndrome) The patient was diagnosed with probable PCOS, and had metabolic syndrome with high cholesterol, high blood pressure and diabetes We discussed resources and books for her to read She appears interested to consider weight loss strategy as a probable means to lose weight. I suspect if she is able to lose weight, she can come off several medications and that eventually lead to lower testosterone level and will correct the polycythemia.  Obesity (BMI 30.0-34.9) The patient is obese.  She is interested with weight loss strategy. We discussed some books and resources for her.  Orders Placed This Encounter  Procedures  . CBC with Differential/Platelet    Standing Status:   Future    Standing Expiration Date:   01/13/2019  . Erythropoietin     Standing Status:   Future    Standing Expiration Date:   01/13/2019    All questions were answered. The patient knows to call the clinic with any problems, questions or concerns. I spent 40 minutes counseling the patient face to face. The total time spent in the appointment was 60 minutes and more than 50% was on counseling.     Heath Lark, MD 12/09/2017 11:54 AM

## 2017-12-09 NOTE — Assessment & Plan Note (Signed)
The patient is obese.  She is interested with weight loss strategy. We discussed some books and resources for her.

## 2017-12-09 NOTE — Telephone Encounter (Signed)
Gave avs and calendar for may  °

## 2017-12-09 NOTE — Assessment & Plan Note (Signed)
The patient was diagnosed with probable PCOS, and had metabolic syndrome with high cholesterol, high blood pressure and diabetes We discussed resources and books for her to read She appears interested to consider weight loss strategy as a probable means to lose weight. I suspect if she is able to lose weight, she can come off several medications and that eventually lead to lower testosterone level and will correct the polycythemia.

## 2017-12-09 NOTE — Assessment & Plan Note (Signed)
Her primary care physician has already completed the workup The most likely cause of her intermittent polycythemia is likely secondary to high circulating testosterone level She does not need further workup Due to potential risk of thrombosis, I recommend 81 mg aspirin therapy, to be taken with food I plan to recheck blood work in 3 months, along with dietary change and lifestyle modification for further review

## 2017-12-16 LAB — HM MAMMOGRAPHY

## 2017-12-21 IMAGING — DX DG CHEST 2V
2 series · 2 of 2 positions shown · non-contrast
Comparison: None.

CLINICAL DATA: Cough

EXAM:
CHEST  2 VIEW

[chest pa]
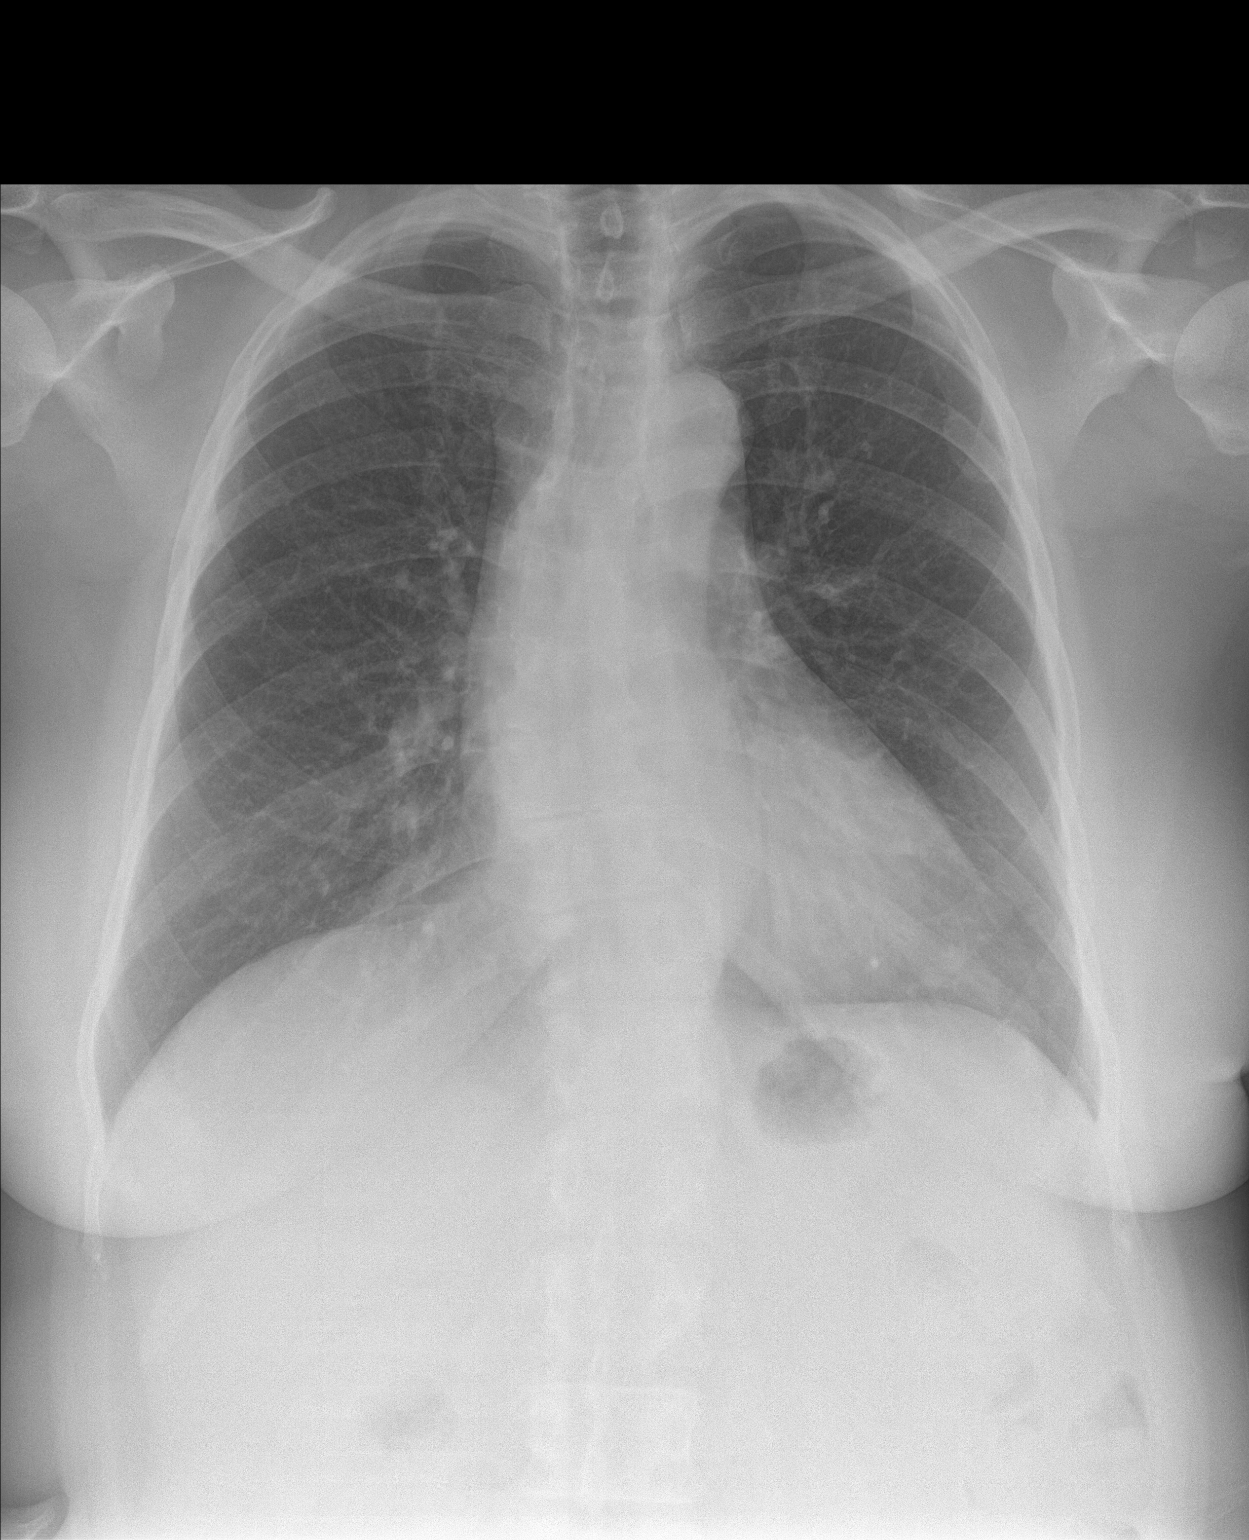

[chest lat]
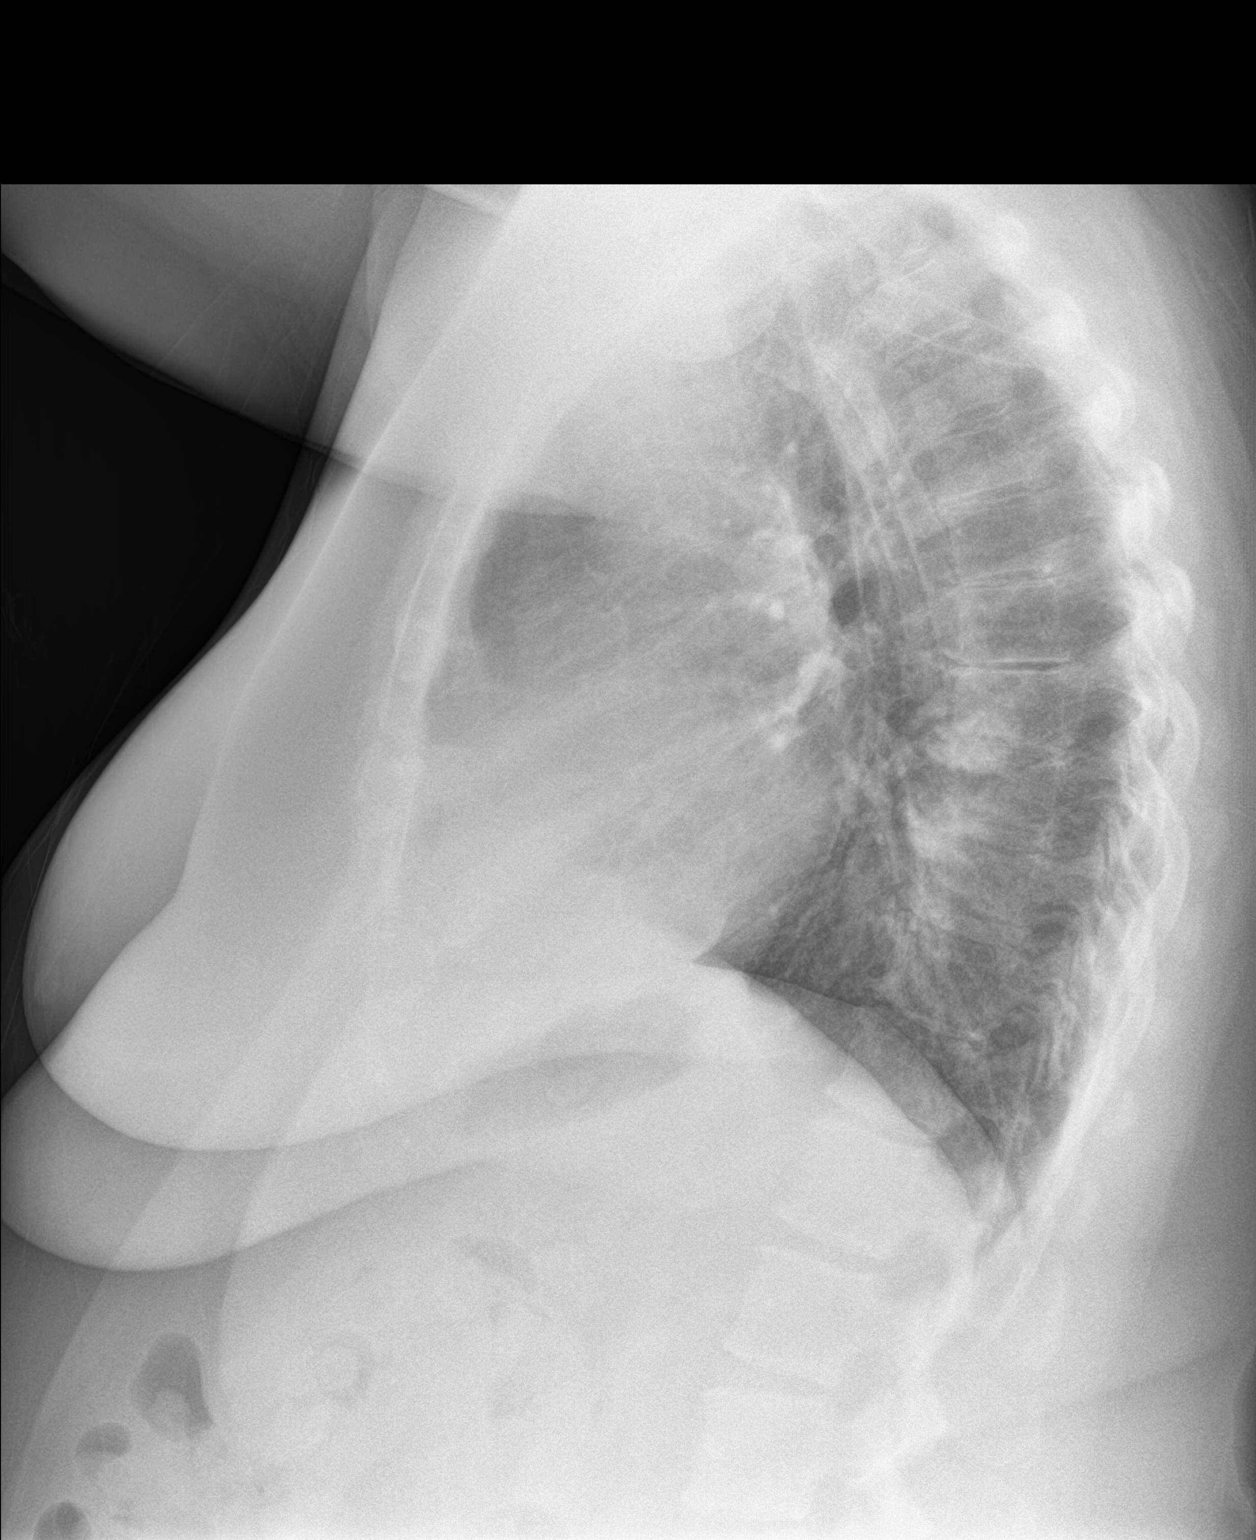

[2 of 2 positions shown; findings below may reference images not displayed]

FINDINGS: Lungs are clear. Heart size and pulmonary vascularity are normal. No
adenopathy. There is mid thoracic dextroscoliosis. There is
degenerative change in the thoracic spine.
IMPRESSION: No edema or consolidation.

## 2018-01-10 ENCOUNTER — Encounter: Payer: Self-pay | Admitting: Family Medicine

## 2018-01-10 DIAGNOSIS — E559 Vitamin D deficiency, unspecified: Secondary | ICD-10-CM | POA: Insufficient documentation

## 2018-01-29 ENCOUNTER — Encounter: Payer: Self-pay | Admitting: Hematology and Oncology

## 2018-01-29 ENCOUNTER — Telehealth: Payer: Self-pay

## 2018-01-29 NOTE — Telephone Encounter (Signed)
Spoke with pt by phone.  Pt reporting red/brown spots on her antecubital area of her left arm approx 1 cm in size (the largest) and some brown dots on her abdomen that were red "a few days ago".  Per Dr Alvy Bimler, pt to hold aspirin x 3 days.  Pt verbalized understanding.

## 2018-02-04 ENCOUNTER — Other Ambulatory Visit: Payer: Self-pay | Admitting: Family Medicine

## 2018-03-09 ENCOUNTER — Inpatient Hospital Stay: Payer: Medicare Other

## 2018-03-09 ENCOUNTER — Encounter: Payer: Self-pay | Admitting: Hematology and Oncology

## 2018-03-09 ENCOUNTER — Inpatient Hospital Stay: Payer: Medicare Other | Attending: Hematology and Oncology | Admitting: Hematology and Oncology

## 2018-03-09 DIAGNOSIS — D751 Secondary polycythemia: Secondary | ICD-10-CM

## 2018-03-09 DIAGNOSIS — E669 Obesity, unspecified: Secondary | ICD-10-CM

## 2018-03-09 LAB — CBC WITH DIFFERENTIAL/PLATELET
BASOS ABS: 0 10*3/uL (ref 0.0–0.1)
Basophils Relative: 1 %
EOS ABS: 0.6 10*3/uL — AB (ref 0.0–0.5)
Eosinophils Relative: 11 %
HCT: 43.8 % (ref 34.8–46.6)
HEMOGLOBIN: 14.2 g/dL (ref 11.6–15.9)
Lymphocytes Relative: 35 %
Lymphs Abs: 1.9 10*3/uL (ref 0.9–3.3)
MCH: 25.7 pg (ref 25.1–34.0)
MCHC: 32.5 g/dL (ref 31.5–36.0)
MCV: 79.3 fL — ABNORMAL LOW (ref 79.5–101.0)
Monocytes Absolute: 0.4 10*3/uL (ref 0.1–0.9)
Monocytes Relative: 7 %
NEUTROS PCT: 46 %
Neutro Abs: 2.5 10*3/uL (ref 1.5–6.5)
Platelets: 219 10*3/uL (ref 145–400)
RBC: 5.53 MIL/uL — AB (ref 3.70–5.45)
RDW: 14.3 % (ref 11.2–14.5)
WBC: 5.5 10*3/uL (ref 3.9–10.3)

## 2018-03-09 NOTE — Assessment & Plan Note (Signed)
Her primary care physician has already completed the workup The most likely cause of her intermittent polycythemia is likely secondary to high circulating testosterone level from PCOS She does not need further workup Due to potential risk of thrombosis, I recommend 81 mg aspirin therapy, to be taken with food We have extensive discussion about lifestyle modification She has lost some weight and has normalized her hemoglobin today She is excited and will continue her dietary modification and weight loss effort She does not need to see me back in the future I will defer to her primary care doctor and endocrinologist for future follow-up

## 2018-03-09 NOTE — Progress Notes (Signed)
[facility] OFFICE PROGRESS NOTE  Ria Bush, MD  ASSESSMENT & PLAN:  Polycythemia, secondary Her primary care physician has already completed the workup The most likely cause of her intermittent polycythemia is likely secondary to high circulating testosterone level from PCOS She does not need further workup Due to potential risk of thrombosis, I recommend 81 mg aspirin therapy, to be taken with food We have extensive discussion about lifestyle modification She has lost some weight and has normalized her hemoglobin today She is excited and will continue her dietary modification and weight loss effort She does not need to see me back in the future I will defer to her primary care doctor and endocrinologist for future follow-up   Obesity (BMI 30.0-34.9) The patient is very excited She has managed to lose a lot of weight with dietary modification The has in turn improved her blood sugar control and hopefully over long-term will help treat her polycystic ovarian syndrome I encouraged her effort   No orders of the defined types were placed in this encounter.   INTERVAL HISTORY: Cynthia Roberson 67 y.o. female returns for further follow-up with her husband She feels well She started dietary modification since last time I saw her and managed to lose close to 5 pounds of weight.  According to the patient, her lowest documented weight was 172 pounds She feels well.  The dietary modification is not too difficult for her to tolerate She does not have any side effects from aspirin  SUMMARY OF HEMATOLOGIC HISTORY:  She was found to have abnormal CBC from routine blood count monitoring through her primary care physician's office. I have the opportunity to review a CBC dated back several years. Her hemoglobin fluctuated between 14.8-16.2. She was diagnosed with PCOS and was noted to have high circulating testosterone with female like frontal balding requiring medications  and also hirsutism She had difficulties getting pregnant with her daughter and had history of irregular periods She is currently on multiple different medications for this. She denies intermittent headaches, shortness of breath on exertion, frequent leg cramps and occasional chest pain.  She never suffer from diagnosis of blood clot.  There is no prior diagnosis of obstructive sleep apnea, although her husband has noted her snoring. The patient does not smoke.  She denies diagnosis of COPD. She complained of chronic excessive fatigue.  She have high stressful situation despite being retired due to her helping her daughter with her grandchildren. She has gained a lot of weight over the past few years. From February to May 2019, the patient has lost weight through dietary modification  I have reviewed the past medical history, past surgical history, social history and family history with the patient and they are unchanged from previous note.  ALLERGIES:  is allergic to amoxicillin; levothyroxine; statins; and sulfa antibiotics.  MEDICATIONS:  Current Outpatient Medications  Medication Sig Dispense Refill  . Biotin 5 MG TABS Take 5 mg by mouth daily.     . Calcium Carbonate 500 MG CHEW Chew 2 tablets by mouth daily.     . Cholecalciferol (VITAMIN D) 2000 UNITS tablet Take 4,000 Units by mouth daily.     . fluticasone (FLONASE) 50 MCG/ACT nasal spray Place 2 sprays into both nostrils daily.    [name] glucose blood (ONETOUCH VERIO) test strip Use to test sugars two times weekly 30 each 12  . levothyroxine (SYNTHROID) 50 MCG tablet TAKE 1 TABLET BY MOUTH EVERY DAY BEFORE BREAKFAST 90 tablet 1  .  loratadine (CLARITIN) 10 MG tablet Take 10 mg by mouth daily as needed.     . metFORMIN (GLUCOPHAGE-XR) 500 MG 24 hr tablet TAKE 1 TABLET(500 MG) BY MOUTH TWICE DAILY 180 tablet 1  . Minoxidil (ROGAINE MENS) 5 % FOAM Apply topically.    . nebivolol (BYSTOLIC) 2.5 MG tablet TAKE 1 TABLET BY MOUTH DAILY 90  tablet 1  . NONFORMULARY OR COMPOUNDED ITEM 2 capsules 2 (two) times daily. Hydro-eye    . ONETOUCH DELICA LANCETS 82U MISC Use to test sugars 2 times weekly 30 each 11  . ranitidine (ZANTAC) 150 MG tablet Take 150 mg by mouth as needed.     . RESTASIS MULTIDOSE 0.05 % ophthalmic emulsion INT 1 GTT INTO OU BID UTD  3  . Turmeric Curcumin 500 MG CAPS Take 1 capsule by mouth daily.    . vitamin B-12 (CYANOCOBALAMIN) 500 MCG tablet Take 500 mcg by mouth daily.    . vitamin E 400 UNIT capsule Take 400 Units by mouth daily.    . Zinc 50 MG TABS Take 1 tablet by mouth daily.     No current facility-administered medications for this visit.      REVIEW OF SYSTEMS:   Constitutional: Denies fevers, chills or night sweats Eyes: Denies blurriness of vision Ears, nose, mouth, throat, and face: Denies mucositis or sore throat Respiratory: Denies cough, dyspnea or wheezes Cardiovascular: Denies palpitation, chest discomfort or lower extremity swelling Gastrointestinal:  Denies nausea, heartburn or change in bowel habits Skin: Denies abnormal skin rashes Lymphatics: Denies new lymphadenopathy or easy bruising Neurological:Denies numbness, tingling or new weaknesses Behavioral/Psych: Mood is stable, no new changes  All other systems were reviewed with the patient and are negative.  PHYSICAL EXAMINATION: ECOG PERFORMANCE STATUS: 0 - Asymptomatic  Vitals:   03/09/18 0849  BP: 140/80  Pulse: 64  Resp: 18  Temp: 98.2 F (36.8 C)  SpO2: 100%   Filed Weights   03/09/18 0849  Weight: 174 lb 11.2 oz (79.2 kg)    GENERAL:alert, no distress and comfortable NEURO: alert & oriented x 3 with fluent speech, no focal motor/sensory deficits  LABORATORY DATA:  I have reviewed the data as listed     Component Value Date/Time   NA 136 11/07/2017 0904   NA 142 04/26/2015 1033   K 4.7 11/07/2017 0904   CL 98 11/07/2017 0904   CO2 32 11/07/2017 0904   GLUCOSE 104 (H) 11/07/2017 0904   BUN 16  11/07/2017 0904   BUN 21 04/26/2015 1033   CREATININE 0.95 11/07/2017 0904   CREATININE 1.02 (H) 02/21/2017 0943   CALCIUM 9.9 11/07/2017 0904   PROT 7.4 11/07/2017 0904   ALBUMIN 4.6 11/07/2017 0904   AST 21 11/07/2017 0904   ALT 29 11/07/2017 0904   ALKPHOS 55 11/07/2017 0904   BILITOT 0.5 11/07/2017 0904   GFRNONAA 57 (L) 02/21/2017 0943   GFRAA 66 02/21/2017 0943    No results found for: SPEP, UPEP  Lab Results  Component Value Date   WBC 5.5 03/09/2018   NEUTROABS 2.5 03/09/2018   HGB 14.2 03/09/2018   HCT 43.8 03/09/2018   MCV 79.3 (L) 03/09/2018   PLT 219 03/09/2018      Chemistry      Component Value Date/Time   NA 136 11/07/2017 0904   NA 142 04/26/2015 1033   K 4.7 11/07/2017 0904   CL 98 11/07/2017 0904   CO2 32 11/07/2017 0904   BUN 16 11/07/2017 0904  BUN 21 04/26/2015 1033   CREATININE 0.95 11/07/2017 0904   CREATININE 1.02 (H) 02/21/2017 0943   GLU 92 03/18/2014      Component Value Date/Time   CALCIUM 9.9 11/07/2017 0904   ALKPHOS 55 11/07/2017 0904   AST 21 11/07/2017 0904   ALT 29 11/07/2017 0904   BILITOT 0.5 11/07/2017 0904      I spent 10 minutes counseling the patient face to face. The total time spent in the appointment was 15 minutes and more than 50% was on counseling.   All questions were answered. The patient knows to call the clinic with any problems, questions or concerns. No barriers to learning was detected.    Heath Lark, MD 5/13/20199:17 AM

## 2018-03-09 NOTE — Assessment & Plan Note (Signed)
The patient is very excited She has managed to lose a lot of weight with dietary modification The has in turn improved her blood sugar control and hopefully over long-term will help treat her polycystic ovarian syndrome I encouraged her effort

## 2018-03-30 ENCOUNTER — Telehealth: Payer: Self-pay | Admitting: Internal Medicine

## 2018-03-30 MED ORDER — SYNTHROID 50 MCG PO TABS: ORAL_TABLET | ORAL | 1 refills | Status: DC

## 2018-03-30 NOTE — Telephone Encounter (Signed)
°  Patient stated she cannot take generic brand of synthroid, she has heart palpations.  90 day supply Synthroid.  [facility] [place], [address] AT [address] #:  [id number]

## 2018-03-30 NOTE — Telephone Encounter (Signed)
Sent as brand name only

## 2018-05-08 ENCOUNTER — Encounter: Payer: Self-pay | Admitting: Family Medicine

## 2018-05-08 ENCOUNTER — Ambulatory Visit (INDEPENDENT_AMBULATORY_CARE_PROVIDER_SITE_OTHER)
Admission: RE | Admit: 2018-05-08 | Discharge: 2018-05-08 | Disposition: A | Discharge status: Home / Self Care | Payer: Medicare Other | Source: Ambulatory Visit | Attending: Family Medicine | Admitting: Family Medicine

## 2018-05-08 ENCOUNTER — Ambulatory Visit: Payer: Medicare Other | Admitting: Family Medicine

## 2018-05-08 VITALS — HR 65 | Temp 98.4°F | Ht 62.0 in | Wt 175.8 lb

## 2018-05-08 DIAGNOSIS — H9313 Tinnitus, bilateral: Secondary | ICD-10-CM | POA: Diagnosis not present

## 2018-05-08 DIAGNOSIS — M25571 Pain in right ankle and joints of right foot: Secondary | ICD-10-CM

## 2018-05-08 DIAGNOSIS — D751 Secondary polycythemia: Secondary | ICD-10-CM

## 2018-05-08 DIAGNOSIS — S8264XG Nondisplaced fracture of lateral malleolus of right fibula, subsequent encounter for closed fracture with delayed healing: Secondary | ICD-10-CM | POA: Insufficient documentation

## 2018-05-08 MED ORDER — NEBIVOLOL HCL 2.5 MG PO TABS: ORAL_TABLET | ORAL | 1 refills | Status: DC

## 2018-05-08 MED ORDER — ASPIRIN 81 MG PO TBEC: 81.0000 mg | DELAYED_RELEASE_TABLET | ORAL | Status: DC

## 2018-05-08 NOTE — Patient Instructions (Addendum)
Xray of right ankle today - I think you had a fracture of lateral ankle 3 months ago.  Use walker boot for the next 3 weeks. Follow up pending radiology read.  No more walking bare foot at home.  Use ankle brace for prolonged walking for extra support.

## 2018-05-08 NOTE — Progress Notes (Signed)
Pulse 65   Temp 98.4 F (36.9 C) (Oral)   Ht 5\' 2"  (1.575 m)   Wt 175 lb 12 oz (79.7 kg)   SpO2 94%   BMI 32.15 kg/m    CC: 6 mo f/u visit Subjective:    Patient ID: Cynthia Roberson, female    DOB: August 23, 1951, 67 y.o.   MRN: [id number]  HPI: Cynthia Roberson is a 67 y.o. female presenting on 05/08/2018 for 6 mo follow up (Pt accompanied by husband.); Ear Problem (Has buzzing in bilateral ears for a few yrs); and Ankle Pain (C/o left ankle pain for 4-5 mos. )   URI sxs over the last week ?allergies - starting to feel better however.    Tinnitus - chronic over last 2 yrs, ongoing.   RIGHT ankle pain - some swelling lateral ankle. H/o recurrent sprains of ankle in the past. She uses Dr Felicie Morn supportive insoles. Worried about stress fracture. Suffered foot injury 4-5 months ago, slid at home. Did not turn ankle, but may have impacted heel. She did have bruising of entire foot after injury.   Polycythemia - thought due to elevated testosterone from PCOS. Aspirin 81mg  started. May have had some petechia and ecchymosis with this. She intermittently uses this.   Relevant past medical, surgical, family and social history reviewed and updated as indicated. Interim medical history since our last visit reviewed. Allergies and medications reviewed and updated. Outpatient Medications Prior to Visit  Medication Sig Dispense Refill  . Biotin 5 MG TABS Take 5 mg by mouth daily.     . Calcium Carbonate 500 MG CHEW Chew 2 tablets by mouth daily.     . Cholecalciferol (VITAMIN D) 2000 UNITS tablet Take 4,000 Units by mouth daily.     . fluticasone (FLONASE) 50 MCG/ACT nasal spray Place 2 sprays into both nostrils daily.    [name] glucose blood (ONETOUCH VERIO) test strip Use to test sugars two times weekly 30 each 12  . loratadine (CLARITIN) 10 MG tablet Take 10 mg by mouth daily as needed.     . metFORMIN (GLUCOPHAGE-XR) 500 MG 24 hr tablet TAKE 1 TABLET(500 MG) BY MOUTH TWICE DAILY 180 tablet 1  .  Minoxidil (ROGAINE MENS) 5 % FOAM Apply topically.    . NONFORMULARY OR COMPOUNDED ITEM 2 capsules 2 (two) times daily. Hydro-eye    . ONETOUCH DELICA LANCETS 10U MISC Use to test sugars 2 times weekly 30 each 11  . ranitidine (ZANTAC) 150 MG tablet Take 150 mg by mouth as needed.     . RESTASIS MULTIDOSE 0.05 % ophthalmic emulsion INT 1 GTT INTO OU BID UTD  3  . SYNTHROID 50 MCG tablet TAKE 1 TABLET BY MOUTH EVERY DAY BEFORE BREAKFAST 90 tablet 1  . Turmeric Curcumin 500 MG CAPS Take 1 capsule by mouth daily.    . vitamin B-12 (CYANOCOBALAMIN) 500 MCG tablet Take 500 mcg by mouth daily.    . vitamin E 400 UNIT capsule Take 400 Units by mouth daily.    . Zinc 50 MG TABS Take 1 tablet by mouth daily.    . ASPIRIN 81 PO Take 1 tablet by mouth. Takes every other day    . nebivolol (BYSTOLIC) 2.5 MG tablet TAKE 1 TABLET BY MOUTH DAILY 90 tablet 1   No facility-administered medications prior to visit.      Per HPI unless specifically indicated in ROS section below Review of Systems     Objective:    Pulse 65  Temp 98.4 F (36.9 C) (Oral)   Ht 5\' 2"  (1.575 m)   Wt 175 lb 12 oz (79.7 kg)   SpO2 94%   BMI 32.15 kg/m   Wt Readings from Last 3 Encounters:  05/08/18 175 lb 12 oz (79.7 kg)  03/09/18 174 lb 11.2 oz (79.2 kg)  12/09/17 177 lb 8 oz (80.5 kg)    Physical Exam  Constitutional: She appears well-developed and well-nourished. No distress.  HENT:  Mouth/Throat: Oropharynx is clear and moist. No oropharyngeal exudate.  Cardiovascular: Normal rate, regular rhythm and normal heart sounds.  No murmur heard. Pulmonary/Chest: Effort normal and breath sounds normal. No respiratory distress. She has no wheezes. She has no rales.  Musculoskeletal: She exhibits no edema.  1+ DP bilaterally No pain along 5th MT base or dorsal midfoot No pain at heel or sole No erythema or bruising present Swelling present bilateral ankles at lateral ligaments Tender with percussion R posterior  lateral malleolus  Skin: Skin is warm and dry. Capillary refill takes less than 2 seconds. No rash noted.  Psychiatric: She has a normal mood and affect.  Nursing note and vitals reviewed.  Results for orders placed or performed in visit on 03/09/18  CBC with Differential/Platelet  Result Value Ref Range   WBC 5.5 3.9 - 10.3 K/uL   RBC 5.53 (H) 3.70 - 5.45 MIL/uL   Hemoglobin 14.2 11.6 - 15.9 g/dL   HCT 43.8 34.8 - 46.6 %   MCV 79.3 (L) 79.5 - 101.0 fL   MCH 25.7 25.1 - 34.0 pg   MCHC 32.5 31.5 - 36.0 g/dL   RDW 14.3 11.2 - 14.5 %   Platelets 219 145 - 400 K/uL   Neutrophils Relative % 46 %   Neutro Abs 2.5 1.5 - 6.5 K/uL   Lymphocytes Relative 35 %   Lymphs Abs 1.9 0.9 - 3.3 K/uL   Monocytes Relative 7 %   Monocytes Absolute 0.4 0.1 - 0.9 K/uL   Eosinophils Relative 11 %   Eosinophils Absolute 0.6 (H) 0.0 - 0.5 K/uL   Basophils Relative 1 %   Basophils Absolute 0.0 0.0 - 0.1 K/uL      Assessment & Plan:   Problem List Items Addressed This Visit    Tinnitus aurium, bilateral    Endorses h/o this, not bothersome. Discussed need to monitor hearing.      Polycythemia, secondary    Has had hematological evaluation for this - reassuring. Attributed to elevated testosterone in setting of PCOS. No further evaluation recommended. She tried aspirin 81mg  daily but noted marked bruising and possibly petechiae - will just take MWF. Will continue to monitor with regular CbC.       Closed nondisp fx of right lateral malleolus with delayed healing - Primary    Suffered injury several months ago to lateral ankle after slipping at home. Xray on my read with healing R lateral malleolar fracture. Given ongoing discomfort, will pace in low CAM walker boot to use over next 3 weeks, encouraged limiting prolonged ambulation and elevation of leg.  ADDENDUM ==> radiology read xray without fibular fracture (no mention of chronic injury or healing bone). Regardless, will treat as clinical fracture as  above.           Meds ordered this encounter  Medications  . nebivolol (BYSTOLIC) 2.5 MG tablet    Sig: TAKE 1 TABLET BY MOUTH DAILY    Dispense:  90 tablet    Refill:  1  . aspirin (  ASPIRIN 81) 81 MG EC tablet    Sig: Take 1 tablet (81 mg total) by mouth every Monday, Wednesday, and Friday. Takes every other day   Orders Placed This Encounter  Procedures  . DG Ankle Complete Right    Standing Status:   Future    Number of Occurrences:   1    Standing Expiration Date:   07/10/2019    Order Specific Question:   Reason for Exam (SYMPTOM  OR DIAGNOSIS REQUIRED)    Answer:   R lateral ankle pain at malleolus, h/o injury 3 mo ago    Order Specific Question:   Preferred imaging location?    Answer:   [facility]    Order Specific Question:   Radiology Contrast Protocol - do NOT remove file path    Answer:   [email or web address]    Follow up plan: No follow-ups on file.  Ria Bush, MD

## 2018-05-09 ENCOUNTER — Encounter: Payer: Self-pay | Admitting: Family Medicine

## 2018-05-09 DIAGNOSIS — H9313 Tinnitus, bilateral: Secondary | ICD-10-CM | POA: Insufficient documentation

## 2018-05-09 NOTE — Assessment & Plan Note (Signed)
Endorses h/o this, not bothersome. Discussed need to monitor hearing.

## 2018-05-09 NOTE — Assessment & Plan Note (Signed)
Suffered injury several months ago to lateral ankle after slipping at home. Xray on my read with healing R lateral malleolar fracture. Given ongoing discomfort, will pace in low CAM walker boot to use over next 3 weeks, encouraged limiting prolonged ambulation and elevation of leg.  ADDENDUM ==> radiology read xray without fibular fracture (no mention of chronic injury or healing bone). Regardless, will treat as clinical fracture as above.

## 2018-05-09 NOTE — Assessment & Plan Note (Signed)
Has had hematological evaluation for this - reassuring. Attributed to elevated testosterone in setting of PCOS. No further evaluation recommended. She tried aspirin 81mg  daily but noted marked bruising and possibly petechiae - will just take MWF. Will continue to monitor with regular CbC.

## 2018-06-10 ENCOUNTER — Telehealth: Payer: Self-pay | Admitting: Internal Medicine

## 2018-06-10 NOTE — Telephone Encounter (Signed)
Nurse with [facility] is calling about paperwork that was faxed over for this patient.  They would like a call back to discuss the paperwork. [employer] also stated that have more then one patient that paperwork was sent on. (9 more)  Fax was for a re evaluation for statin therapy and if the patient is on statin Please advise  [phone] Nurse said anyone could help with this,.

## 2018-06-10 NOTE — Telephone Encounter (Signed)
This is a question for Dr Danise Mina.  He would be happy to address.

## 2018-07-05 ENCOUNTER — Other Ambulatory Visit: Payer: Self-pay | Admitting: Internal Medicine

## 2018-07-24 ENCOUNTER — Encounter: Payer: Self-pay | Admitting: Internal Medicine

## 2018-07-24 ENCOUNTER — Ambulatory Visit: Payer: Medicare Other | Admitting: Internal Medicine

## 2018-07-24 VITALS — BP 129/80 | HR 70 | Ht 62.0 in | Wt 178.0 lb

## 2018-07-24 DIAGNOSIS — R7303 Prediabetes: Secondary | ICD-10-CM | POA: Diagnosis not present

## 2018-07-24 DIAGNOSIS — E281 Androgen excess: Secondary | ICD-10-CM | POA: Diagnosis not present

## 2018-07-24 DIAGNOSIS — E039 Hypothyroidism, unspecified: Secondary | ICD-10-CM

## 2018-07-24 DIAGNOSIS — Z23 Encounter for immunization: Secondary | ICD-10-CM

## 2018-07-24 DIAGNOSIS — E282 Polycystic ovarian syndrome: Secondary | ICD-10-CM

## 2018-07-24 LAB — TSH: TSH: 1.36 u[IU]/mL (ref 0.35–4.50)

## 2018-07-24 LAB — HEMOGLOBIN A1C: Hgb A1c MFr Bld: 6 % (ref 4.6–6.5)

## 2018-07-24 LAB — T4, FREE: FREE T4: 0.96 ng/dL (ref 0.60–1.60)

## 2018-07-24 MED ORDER — METFORMIN HCL ER 500 MG PO TB24: ORAL_TABLET | ORAL | 3 refills | Status: DC

## 2018-07-24 NOTE — Patient Instructions (Signed)
Please stop at the lab.  Please come back for a follow-up appointment in 1 year.  

## 2018-07-24 NOTE — Progress Notes (Signed)
Patient ID: Cynthia Roberson, female   DOB: 03/19/1951, 67 y.o.   MRN: [id number]   HPI  Cynthia Roberson is a 67 y.o.-year-old female, returning for post menopausal hyperandrogenism, hypothyroidism, prediabetes. Last visit with me one year ago.  Reviewed and addended history: Patient had a long history of hyperandrogenism (high testosterone level), and has a history of PCOS + infertility. She could not tolerate OCPs in the past (migraines).   Reviewed the records from Dr. Howell Rucks:  The patient has a history of scalp hair thinning since her younger years, for which she has been using prescription strength, Rogaine with good results. She is using it only once daily and hair thinning has slowed down. She also has noticed increased hair growth over her face, shoulders, chest, abdomen, and change in the texture of these hairs over past several years.  No acne.  She is postmenopausal since ~ age 41. She had a always had a history of irregular menses and had been on birth control in the past. She had difficulty with conception and had used Clomid. She has had 2 successful pregnancies in the past. Following this, the patient had regular menses for some time until she attained menopause.  No signs of masculinization.  Testosterone level - elevated at 130 in 2013.  Subsequent levels were still high, however, all of the levels below were drawn while on spironolactone. We stopped checking her testosterone levels after 03/2015, when she was on spironolactone Component     Latest Ref Rng 07/06/2014 07/14/2014 10/11/2014 04/26/2015  Testosterone     3 - 41 ng/dL 173 (H)  97 (H)   Sex Hormone Binding     18 - 114 nmol/L 31     Testosterone Free     0.0 - 4.2 pg/mL 33.8 (H)  6.1 (H)   Testosterone-% Free     0.4 - 2.4 % 2.0     FSH      26.1     LH      20.3     DHEA-SO4     29.4 - 220.5 ug/dL  77.0    Testosterone, total     7.0 - 40.0 ng/dL    122.7 (H)   Other previous work-up: - Ruled out for  Cushing's syndrome based on 1 mg dex suppression test- morning cortisol is 1.47 (05/2013) - DHEAS 44.3, Prolactin 5.9 (05/2013) - CT Abdomen and pelvis with contrast (04/2013) - No adrenal mass or any ovarian abnormality - Ovarian US was negative (06/2014) At last visit with Dr.Phadke, the use of flutamide was discussed, as well as the possibility of oophorectomy.  She is also seeing Dr Ronita Hipps [facility]) and Dr Ubaldo Glassing (derm).  She has been on spironolactone since 05/2013 with mild elevation in her potassium levels so the dose of spironolactone was maintained low, 25 mg daily.  Approximately 2 years ago we both decided to stop spironolactone and she started to feel better after we did so.  She noticed significantly less hair on chin especially after she started to wax every 3 weeks.  Her free testosterone level was normal off spironolactone:  Component     Latest Ref Rng & Units 07/24/2017  Testosterone     3 - 41 ng/dL 82 (H)  Testosterone Free     0.0 - 4.2 pg/mL 2.8  Sex Horm Binding Glob, Serum     17.3 - 125.0 nmol/L 50.8   Latest potassium level was reviewed and this was normal: Lab Results  Component Value  Date   K 4.7 11/07/2017   She continues on minoxidil for scalp hair loss.  Hypothyroidism, well-controlled - dx 1995 -She continues on Synthroid d.a.w. 50 mcg daily for the last 15 years -At last visit we tried to switch to generic levothyroxine but she could not tolerate it due to palpitations  She takes the Synthroid: - in am - fasting - at least 30 min from b'fast - no Ca, Fe, MVI, PPIs - Not on biotin anymore  Reviewed latest TSH level: Lab Results  Component Value Date   TSH 1.87 11/07/2017   Prediabetes.  - She did not tolerate  regular metformin in the past >> she continues on metformin ER 1000 mg daily  Latest HbA1c was in the prediabetic range: Lab Results  Component Value Date   HGBA1C 5.9 11/07/2017   Last eye exam: 06/2016: No DR  She started ASA 81  per Dr. Alvy Bimler >> bruising, petechiae >> stopped.  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 Otherwise, unchanged from my initial visit note.  Past Medical History:  Diagnosis Date  . Clostridium difficile infection 2013  . Hirsutism   . Hyperandrogenemia   . Hypertension   . Osteopenia    DEXA 2016  . Other alopecia   . Polycystic ovaries   . Unspecified hypothyroidism    Past Surgical History:  Procedure Laterality Date  . COLONOSCOPY  2015   h/o polyps, rpt 5 yrs (Outlaw)  . COLONOSCOPY  2011   adenomatous polyp, rpt 5 yrs ([facility])  . TONSILLECTOMY    . WISDOM TOOTH EXTRACTION     Social History   Social History  . Marital Status: Married    Spouse Name: N/A  . Number of Children: 2   Occupational History  . Retired Pharmacist, hospital, now Counsellor   Social History Main Topics  . Smoking status: Never Smoker   . Smokeless tobacco: Not on file  . Alcohol Use:     1-3 Glasses of wine per year  . Drug Use: No  . Sexual Activity: Yes   Current Outpatient Medications on File Prior to Visit  Medication Sig Dispense Refill  . aspirin (ASPIRIN 81) 81 MG EC tablet Take 1 tablet (81 mg total) by mouth every Monday, Wednesday, and Friday. Takes every other day    . Biotin 5 MG TABS Take 5 mg by mouth daily.     . Calcium  Carbonate 500 MG CHEW Chew 2 tablets by mouth daily.     . Cholecalciferol (VITAMIN D) 2000 UNITS tablet Take 4,000 Units by mouth daily.     . fluticasone (FLONASE) 50 MCG/ACT nasal spray Place 2 sprays into both nostrils daily.    [name] glucose blood (ONETOUCH VERIO) test strip Use to test sugars two times weekly 30 each 12  . loratadine (CLARITIN) 10 MG tablet Take 10 mg by mouth daily as needed.     . metFORMIN (GLUCOPHAGE-XR) 500 MG 24 hr tablet TAKE 1 TABLET(500 MG) BY  MOUTH TWICE DAILY 180 tablet 0  . Minoxidil (ROGAINE MENS) 5 % FOAM Apply topically.    . nebivolol (BYSTOLIC) 2.5 MG tablet TAKE 1 TABLET BY MOUTH DAILY 90 tablet 1  . NONFORMULARY OR COMPOUNDED ITEM 2 capsules 2 (two) times daily. Hydro-eye    . ONETOUCH DELICA LANCETS 89Q MISC Use to test sugars 2 times weekly 30 each 11  . ranitidine (ZANTAC) 150 MG tablet Take 150 mg by mouth as needed.     . RESTASIS MULTIDOSE 0.05 % ophthalmic emulsion INT 1 GTT INTO OU BID UTD  3  . SYNTHROID 50 MCG tablet TAKE 1 TABLET BY MOUTH EVERY DAY BEFORE BREAKFAST 90 tablet 1  . Turmeric Curcumin 500 MG CAPS Take 1 capsule by mouth daily.    . vitamin B-12 (CYANOCOBALAMIN) 500 MCG tablet Take 500 mcg by mouth daily.    . vitamin E 400 UNIT capsule Take 400 Units by mouth daily.    . Zinc 50 MG TABS Take 1 tablet by mouth daily.     No current facility-administered medications on file prior to visit.    Allergies  Allergen Reactions  . Amoxicillin Rash  . Levothyroxine Palpitations    With generic levothyroxine, tolerates well the brand name.  . Statins Palpitations    Tried Crestor and others  . Sulfa Antibiotics Rash   Family History  Problem Relation Age of Onset  . Heart failure Mother   . Cancer Mother 32       lung  . Heart disease Father   . Stroke Father   . Hypertension Father    PE: BP 129/80   Pulse 70   Ht 5\' 2"  (1.575 m) Comment: measured  Wt 178 lb (80.7 kg)   SpO2 98%   BMI 32.56 kg/m  Wt Readings from Last  3 Encounters:  07/24/18 178 lb (80.7 kg)  05/08/18 175 lb 12 oz (79.7 kg)  03/09/18 174 lb 11.2 oz (79.2 kg)   Constitutional: overweight, in NAD Eyes: PERRLA, EOMI, no exophthalmos ENT: moist mucous membranes, no thyromegaly, no cervical lymphadenopathy Cardiovascular: RRR, No MRG Respiratory: CTA B Gastrointestinal: abdomen soft, NT, ND, BS+ Musculoskeletal: no deformities, strength intact in all 4 Skin: moist, warm, no rashes Neurological: no tremor with outstretched hands, DTR normal in all 4  ASSESSMENT: 1. High testosterone level in a postmenopausal woman/history of PCOS - She discussed with Dr. Howell Rucks and Dr. Ronita Hipps before about the possibility of performing oophorectomy to look for ovarian hyperthecosis or small hilar ovarian tumors. She refused. We could continue to just follow her clinically and by labs for now.  2. Hypothyroidism  3. Prediabetes  PLAN:  1. High testosterone level in a postmenopausal woman/likely due to PCOS Patient with history of hyperandrogenism due to most likely previously undiagnosed PCOS or possibly secondary to resistance (less likely).  Adrenal tumors, ovarian tumors, or ovarian hyperthecosis are less likely for her since these occur with usually higher levels of testosterone, higher than 150 ng/mL.  Patient had repeated CT imaging of her adrenals and also transvaginal ultrasound for evaluation of her ovaries and this test were all negative for any masses or hyperplasia.  Her hormonal testing was also negative for hyperprolactinemia, pituitary dysfunction, Cushing syndrome. -She was on Spironolactone but we stopped recently and she feels better after medicine.  Also, her hair continues to improve on chin especially after she started waxing.  At last visit, we checked her free testosterone level and this was normal! -She is using  Rogaine for female pattern baldness -we will continue this as she feels this is helping. -She is also on metformin ER for her  prediabetes and she feels that this is also helping with hirsutism -we will continue this -We will recheck another testosterone level today -I will see her back in a year  2. Hypothyroidism - latest thyroid labs reviewed with pt >> normal  - she continues on Synthroid 50 mcg daily.  We had to change back to brand name since she had palpitations with the generic levothyroxine) - pt feels good on this dose. - we discussed about taking the thyroid hormone every day, with water, >30 minutes before breakfast, separated by >4 hours from acid reflux medications, calcium, iron, multivitamins. Pt. is taking it correctly. - will check thyroid tests today: TSH and fT4 - If labs are abnormal, she will need to return for repeat TFTs in 1.5 months  3. Prediabetes -Continue metformin ER 1000 mg with breakfast or lunch -We will repeat an HbA1c today -We will give her the flu shot today  Needs refills of levothyroxine.   Component     Latest Ref Rng & Units 07/24/2018  Testosterone, Serum (Total)     ng/dL 92 (H)  % Free Testosterone     % 1.2  Free Testosterone, S     pg/mL 11 (H)  Sex Hormone Binding Globulin     nmol/L 50.3  TSH     0.35 - 4.50 uIU/mL 1.36  T4,Free(Direct)     0.60 - 1.60 ng/dL 0.96  Hemoglobin A1C     4.6 - 6.5 % 6.0  TFTs normal.  Testosterone level slightly high.  We will continue to follow this, but no intervention needed for now.  Philemon Kingdom, MD PhD [facility]

## 2018-07-28 LAB — TESTOSTERONE, FREE AND TOTAL (INCLUDES SHBG)-(MALES)
% Free Testosterone: 1.2 %
Free Testosterone, S: 11 pg/mL — ABNORMAL HIGH
Sex Hormone Binding Globulin: 50.3 nmol/L
TESTOSTERONE, SERUM (TOTAL): 92 ng/dL — AB

## 2018-07-28 MED ORDER — SYNTHROID 50 MCG PO TABS: ORAL_TABLET | ORAL | 3 refills | Status: DC

## 2018-11-10 ENCOUNTER — Ambulatory Visit: Payer: Medicare Other

## 2018-11-12 ENCOUNTER — Ambulatory Visit (INDEPENDENT_AMBULATORY_CARE_PROVIDER_SITE_OTHER): Payer: Medicare Other

## 2018-11-12 VITALS — BP 130/82 | HR 61 | Temp 98.7°F | Ht 61.75 in | Wt 182.5 lb

## 2018-11-12 DIAGNOSIS — E785 Hyperlipidemia, unspecified: Secondary | ICD-10-CM | POA: Diagnosis not present

## 2018-11-12 DIAGNOSIS — Z0000 Encounter for general adult medical examination without abnormal findings: Secondary | ICD-10-CM | POA: Diagnosis not present

## 2018-11-12 DIAGNOSIS — I10 Essential (primary) hypertension: Secondary | ICD-10-CM | POA: Diagnosis not present

## 2018-11-12 LAB — LIPID PANEL
CHOLESTEROL: 217 mg/dL — AB (ref 0–200)
HDL: 61.3 mg/dL (ref >39.00)
LDL Cholesterol: 135 mg/dL — ABNORMAL HIGH (ref 0–99)
NonHDL: 155.85
Total CHOL/HDL Ratio: 4
Triglycerides: 106 mg/dL (ref 0.0–149.0)
VLDL: 21.2 mg/dL (ref 0.0–40.0)

## 2018-11-12 LAB — CBC WITH DIFFERENTIAL/PLATELET
BASOS ABS: 0 10*3/uL (ref 0.0–0.1)
Basophils Relative: 0.8 % (ref 0.0–3.0)
EOS ABS: 0.2 10*3/uL (ref 0.0–0.7)
Eosinophils Relative: 3 % (ref 0.0–5.0)
HCT: 44.6 % (ref 36.0–46.0)
HEMOGLOBIN: 14.7 g/dL (ref 12.0–15.0)
LYMPHS ABS: 1.9 10*3/uL (ref 0.7–4.0)
Lymphocytes Relative: 37.4 % (ref 12.0–46.0)
MCHC: 33 g/dL (ref 30.0–36.0)
MCV: 78.7 fl (ref 78.0–100.0)
MONO ABS: 0.4 10*3/uL (ref 0.1–1.0)
MONOS PCT: 7.1 % (ref 3.0–12.0)
NEUTROS ABS: 2.7 10*3/uL (ref 1.4–7.7)
NEUTROS PCT: 51.7 % (ref 43.0–77.0)
Platelets: 235 10*3/uL (ref 150.0–400.0)
RBC: 5.66 Mil/uL — AB (ref 3.87–5.11)
RDW: 14.3 % (ref 11.5–15.5)
WBC: 5.2 10*3/uL (ref 4.0–10.5)

## 2018-11-12 LAB — COMPREHENSIVE METABOLIC PANEL
ALBUMIN: 4.3 g/dL (ref 3.5–5.2)
ALK PHOS: 48 U/L (ref 39–117)
ALT: 28 U/L (ref 0–35)
AST: 21 U/L (ref 0–37)
BILIRUBIN TOTAL: 0.5 mg/dL (ref 0.2–1.2)
BUN: 15 mg/dL (ref 6–23)
CO2: 32 mEq/L (ref 19–32)
Calcium: 9.8 mg/dL (ref 8.4–10.5)
Chloride: 103 mEq/L (ref 96–112)
Creatinine, Ser: 0.98 mg/dL (ref 0.40–1.20)
GFR: 59.87 mL/min — AB (ref >60.00)
Glucose, Bld: 114 mg/dL — ABNORMAL HIGH (ref 70–99)
POTASSIUM: NONE SEEN meq/L — AB (ref 3.5–5.1)
SODIUM: 139 meq/L (ref 135–145)
TOTAL PROTEIN: 6.9 g/dL (ref 6.0–8.3)

## 2018-11-12 NOTE — Progress Notes (Signed)
Subjective:   Cynthia Roberson is a 68 y.o. female who presents for Medicare Annual (Subsequent) preventive examination.  Review of Systems:  N/A Cardiac Risk Factors include: advanced age (>30men, >12 women);obesity (BMI >30kg/m2);hypertension;dyslipidemia     Objective:     Vitals: BP 130/82 (BP Location: Left Arm, Patient Position: Sitting, Cuff Size: Normal)   Pulse 61   Temp 98.7 F (37.1 C) (Oral)   Ht 5' 1.75" (1.568 m) Comment: no shoes  Wt 182 lb 8 oz (82.8 kg)   SpO2 98%   BMI 33.65 kg/m   Body mass index is 33.65 kg/m.  Advanced Directives 11/07/2017 11/06/2016  Does Patient Have a Medical Advance Directive? No No  Would patient like information on creating a medical advance directive? Yes (MAU/Ambulatory/Procedural Areas - Information given) -    Tobacco Social History   Tobacco Use  Smoking Status Never Smoker  Smokeless Tobacco Never Used     Counseling given: No   Clinical Intake:  Pre-visit preparation completed: Yes  Pain : No/denies pain Pain Score: 1      Nutritional Status: BMI 25 -29 Overweight Nutritional Risks: None Diabetes: Yes CBG done?: No Did pt. bring in CBG monitor from home?: No  How often do you need to have someone help you when you read instructions, pamphlets, or other written materials from your doctor or pharmacy?: 1 - Never  Interpreter Needed?: No  Comments: pt lives with spouse Information entered by :: LPinson, LPN  Past Medical History:  Diagnosis Date  . Clostridium difficile infection 2013  . Hirsutism   . Hyperandrogenemia   . Hypertension   . Osteopenia    DEXA 2016  . Other alopecia   . Polycystic ovaries   . Unspecified hypothyroidism    Past Surgical History:  Procedure Laterality Date  . COLONOSCOPY  2015   h/o polyps, rpt 5 yrs (Outlaw)  . COLONOSCOPY  2011   adenomatous polyp, rpt 5 yrs ([facility])  . TONSILLECTOMY    . WISDOM TOOTH EXTRACTION     Family History  Problem Relation Age of  Onset  . Heart failure Mother   . Cancer Mother 16       lung  . Heart disease Father   . Stroke Father   . Hypertension Father    Social History   Socioeconomic History  . Marital status: Married    Spouse name: [name]  . Number of children: Not on file  . Years of education: Not on file  . Highest education level: Not on file  Occupational History  . Not on file  Social Needs  . Financial resource strain: Not on file  . Food insecurity:    Worry: Not on file    Inability: Not on file  . Transportation needs:    Medical: Not on file    Non-medical: Not on file  Tobacco Use  . Smoking status: Never Smoker  . Smokeless tobacco: Never Used  Substance and Sexual Activity  . Alcohol use: Yes    Comment: occassionally  . Drug use: No  . Sexual activity: Yes  Lifestyle  . Physical activity:    Days per week: Not on file    Minutes per session: Not on file  . Stress: Not on file  Relationships  . Social connections:    Talks on phone: Not on file    Gets together: Not on file    Attends religious service: Not on file    Active member of  club or organization: Not on file    Attends meetings of clubs or organizations: Not on file    Relationship status: Not on file  Other Topics Concern  . Not on file  Social History Narrative   Lives with husband Ermalene Searing: retired, was middle Research scientist (physical sciences)    Activity: stays active caring for grandchildren   Diet: good water, fruits/vegetables daily     Outpatient Encounter Medications as of 11/12/2018  Medication Sig  . Biotin 5 MG TABS Take 5 mg by mouth daily.   . Calcium Carb-Cholecalciferol (CALTRATE 600+D3 SOFT PO) Take by mouth.  . Calcium Carbonate 500 MG CHEW Chew 2 tablets by mouth daily.   . Cholecalciferol (VITAMIN D) 2000 UNITS tablet Take 4,000 Units by mouth daily.   . cycloSPORINE (RESTASIS OP) Apply 1 drop to eye 2 (two) times daily.  . fluticasone (FLONASE) 50 MCG/ACT nasal spray Place 2 sprays into both  nostrils daily.  [name] glucose blood (ONETOUCH VERIO) test strip Use to test sugars two times weekly  . loratadine (CLARITIN) 10 MG tablet Take 10 mg by mouth daily as needed.   . metFORMIN (GLUCOPHAGE-XR) 500 MG 24 hr tablet Please take 1 tab 2x a day  . Minoxidil (ROGAINE MENS) 5 % FOAM Apply topically.  . nebivolol (BYSTOLIC) 2.5 MG tablet TAKE 1 TABLET BY MOUTH DAILY  . NONFORMULARY OR COMPOUNDED ITEM 2 capsules 2 (two) times daily. Hydro-eye  . ONETOUCH DELICA LANCETS 49S MISC Use to test sugars 2 times weekly  . SYNTHROID 50 MCG tablet TAKE 1 TABLET BY MOUTH EVERY DAY BEFORE BREAKFAST  . Turmeric Curcumin 500 MG CAPS Take 1 capsule by mouth daily.  . vitamin B-12 (CYANOCOBALAMIN) 500 MCG tablet Take 500 mcg by mouth daily.  . vitamin E 400 UNIT capsule Take 400 Units by mouth daily.  . Zinc 50 MG TABS Take 1 tablet by mouth daily.  . [DISCONTINUED] aspirin (ASPIRIN 81) 81 MG EC tablet Take 1 tablet (81 mg total) by mouth every Monday, Wednesday, and Friday. Takes every other day  . [DISCONTINUED] ranitidine (ZANTAC) 150 MG tablet Take 150 mg by mouth as needed.   . [DISCONTINUED] RESTASIS MULTIDOSE 0.05 % ophthalmic emulsion INT 1 GTT INTO OU BID UTD   No facility-administered encounter medications on file as of 11/12/2018.     Activities of Daily Living In your present state of health, do you have any difficulty performing the following activities: 11/12/2018  Hearing? N  Vision? N  Difficulty concentrating or making decisions? N  Walking or climbing stairs? N  Dressing or bathing? N  Doing errands, shopping? N  Preparing Food and eating ? N  Using the Toilet? N  In the past six months, have you accidently leaked urine? N  Do you have problems with loss of bowel control? N  Managing your Medications? N  Managing your Finances? N  Housekeeping or managing your Housekeeping? N  Some recent data might be hidden    Patient Care Team: Ria Bush, MD as PCP - General (Family  Medicine) Philemon Kingdom, MD as Consulting Physician (Internal Medicine) Rolm Bookbinder, MD as Consulting Physician (Dermatology) Rolley Sims, Pinch as Consulting Physician (Optometry) Arta Silence, MD as Consulting Physician (Gastroenterology) Brien Few, MD as Consulting Physician (Obstetrics and Gynecology) Nyra Capes, DDS as Consulting Physician (Dentistry) Harrie Foreman, MD as Consulting Physician (Neurology)    Assessment:   This is a routine wellness examination for [place].   Hearing Screening  125Hz  250Hz  500Hz  1000Hz  2000Hz  3000Hz  4000Hz  6000Hz  8000Hz   Right ear:   40 40 40  40    Left ear:   40 40 40  40    Vision Screening Comments: Vision exam in 2019   Exercise Activities and Dietary recommendations Current Exercise Habits: Home exercise routine, Type of exercise: treadmill, Time (Minutes): 15, Frequency (Times/Week): 7, Weekly Exercise (Minutes/Week): 105, Intensity: Mild, Exercise limited by: None identified  Goals    . Follow up with Primary Care Provider     Starting 11/12/2018, I will continue to take medications as prescribed and to keep appointments with PCP as scheduled.        Fall Risk Fall Risk  11/12/2018 11/07/2017 11/06/2016  Falls in the past year? 0 No No   Depression Screen PHQ 2/9 Scores 11/12/2018 11/07/2017 11/06/2016  PHQ - 2 Score 0 0 0  PHQ- 9 Score 0 0 -     Cognitive Function MMSE - Mini Mental State Exam 11/12/2018 11/07/2017 11/06/2016  Orientation to time 5 5 5   Orientation to Place 5 5 5   Registration 3 3 3   Attention/ Calculation 0 0 0  Recall 3 3 3   Language- name 2 objects 0 0 0  Language- repeat 1 1 1   Language- follow 3 step command 3 3 3   Language- read & follow direction 0 0 0  Write a sentence 0 0 0  Copy design 0 0 0  Total score 20 20 20      PLEASE NOTE: A Mini-Cog screen was completed. Maximum score is 20. A value of 0 denotes this part of [place] MMSE was not completed or the patient failed this part  of the Mini-Cog screening.   Mini-Cog Screening Orientation to Time - Max 5 pts Orientation to Place - Max 5 pts Registration - Max 3 pts Recall - Max 3 pts Language Repeat - Max 1 pts Language Follow 3 Step Command - Max 3 pts     Immunization History  Administered Date(s) Administered  . Influenza Split 07/11/2014  . Influenza, High Dose Seasonal PF 07/24/2017, 07/24/2018  . Influenza,inj,Quad PF,6+ Mos 07/28/2015, 07/05/2016  . Influenza-Unspecified 07/24/2017  . Pneumococcal Conjugate-13 08/15/2015  . Pneumococcal Polysaccharide-23 11/11/2016  . Tdap 04/11/2011  . Zoster 01/13/2014  . Zoster Recombinat (Shingrix) 12/09/2017, 11/02/2018    Screening Tests Health Maintenance  Topic Date Due  . MAMMOGRAM  11/29/2019  . DTaP/Tdap/Td (2 - Td) 04/10/2021  . TETANUS/TDAP  04/10/2021  . COLONOSCOPY  12/26/2021  . INFLUENZA VACCINE  Completed  . DEXA SCAN  Completed  . Hepatitis C Screening  Completed  . PNA vac Low Risk Adult  Completed      Plan:     I have personally reviewed, addressed, and noted the following in the patient's chart:  A. Medical and social history B. Use of alcohol, tobacco or illicit drugs  C. Current medications and supplements D. Functional ability and status E.  Nutritional status F.  Physical activity G. Advance directives H. List of other physicians I.  Hospitalizations, surgeries, and ER visits in previous 12 months J.  [place] to include hearing, vision, cognitive, depression L. Referrals and appointments - none  In addition, I have reviewed and discussed with patient certain preventive protocols, quality metrics, and best practice recommendations. A written personalized care plan for preventive services as well as general preventive health recommendations were provided to patient.  See attached scanned questionnaire for additional information.   Signed,  Lindell Noe, MHA, BS, LPN Health Coach

## 2018-11-12 NOTE — Patient Instructions (Signed)
Cynthia Roberson , Thank you for taking time to come for your Medicare Wellness Visit. I appreciate your ongoing commitment to your health goals. Please review the following plan we discussed and let me know if I can assist you in the future.   These are the goals we discussed: Goals    . Follow up with Primary Care Provider     Starting 11/12/2018, I will continue to take medications as prescribed and to keep appointments with PCP as scheduled.        This is a list of the screening recommended for you and due dates:  Health Maintenance  Topic Date Due  . Mammogram  11/29/2019  . DTaP/Tdap/Td vaccine (2 - Td) 04/10/2021  . Tetanus Vaccine  04/10/2021  . Colon Cancer Screening  12/26/2021  . Flu Shot  Completed  . DEXA scan (bone density measurement)  Completed  .  Hepatitis C: One time screening is recommended by Center for Disease Control  (CDC) for  adults born from [birth year] through 1966.   Completed  . Pneumonia vaccines  Completed   Preventive Care for Adults  A healthy lifestyle and preventive care can promote health and wellness. Preventive health guidelines for adults include the following key practices.  . A routine yearly physical is a good way to check with your health care provider about your health and preventive screening. It is a chance to share any concerns and updates on your health and to receive a thorough exam.  . Visit your dentist for a routine exam and preventive care every 6 months. Brush your teeth twice a day and floss once a day. Good oral hygiene prevents tooth decay and gum disease.  . The frequency of eye exams is based on your age, health, family medical history, use  of contact lenses, and other factors. Follow your health care provider's recommendations for frequency of eye exams.  . Eat a healthy diet. Foods like vegetables, fruits, whole grains, low-fat dairy products, and lean protein foods contain the nutrients you need without too many calories. Decrease  your intake of foods high in solid fats, added sugars, and salt. Eat the right amount of calories for you. Get information about a proper diet from your health care provider, if necessary.  . Regular physical exercise is one of the most important things you can do for your health. Most adults should get at least 150 minutes of moderate-intensity exercise (any activity that increases your heart rate and causes you to sweat) each week. In addition, most adults need muscle-strengthening exercises on 2 or more days a week.  Silver Sneakers may be a benefit available to you. To determine eligibility, you may visit the website: www.silversneakers.com or contact program at 616-812-5808 Mon-Fri between 8AM-8PM.   . Maintain a healthy weight. The body mass index (BMI) is a screening tool to identify possible weight problems. It provides an estimate of body fat based on height and weight. Your health care provider can find your BMI and can help you achieve or maintain a healthy weight.   For adults 20 years and older: ? A BMI below 18.5 is considered underweight. ? A BMI of 18.5 to 24.9 is normal. ? A BMI of 25 to 29.9 is considered overweight. ? A BMI of 30 and above is considered obese.   . Maintain normal blood lipids and cholesterol levels by exercising and minimizing your intake of saturated fat. Eat a balanced diet with plenty of fruit and vegetables. Blood tests  for lipids and cholesterol should begin at age 110 and be repeated every 5 years. If your lipid or cholesterol levels are high, you are over 50, or you are at high risk for heart disease, you may need your cholesterol levels checked more frequently. Ongoing high lipid and cholesterol levels should be treated with medicines if diet and exercise are not working.  . If you smoke, find out from your health care provider how to quit. If you do not use tobacco, please do not start.  . If you choose to drink alcohol, please do not consume more than  2 drinks per day. One drink is considered to be 12 ounces (355 mL) of beer, 5 ounces (148 mL) of wine, or 1.5 ounces (44 mL) of liquor.  . If you are 68-39 years old, ask your health care provider if you should take aspirin to prevent strokes.  . Use sunscreen. Apply sunscreen liberally and repeatedly throughout the day. You should seek shade when your shadow is shorter than you. Protect yourself by wearing long sleeves, pants, a wide-brimmed hat, and sunglasses year round, whenever you are outdoors.  . Once a month, do a whole body skin exam, using a mirror to look at the skin on your back. Tell your health care provider of new moles, moles that have irregular borders, moles that are larger than a pencil eraser, or moles that have changed in shape or color.

## 2018-11-12 NOTE — Progress Notes (Signed)
PCP notes:   Health maintenance:  No gaps identified.  Abnormal screenings:   None  Patient concerns:   Pain in neck, back, and right ankle.   Intermittent numbness in left hand.  Nurse concerns:  None  Next PCP appt:   11/16/18 @ 0930

## 2018-11-16 ENCOUNTER — Encounter: Payer: Self-pay | Admitting: Family Medicine

## 2018-11-16 ENCOUNTER — Ambulatory Visit (INDEPENDENT_AMBULATORY_CARE_PROVIDER_SITE_OTHER): Payer: Medicare Other | Admitting: Family Medicine

## 2018-11-16 VITALS — BP 120/80 | HR 79 | Temp 97.8°F | Ht 61.75 in | Wt 181.5 lb

## 2018-11-16 DIAGNOSIS — E785 Hyperlipidemia, unspecified: Secondary | ICD-10-CM

## 2018-11-16 DIAGNOSIS — M25571 Pain in right ankle and joints of right foot: Secondary | ICD-10-CM | POA: Insufficient documentation

## 2018-11-16 DIAGNOSIS — Z0000 Encounter for general adult medical examination without abnormal findings: Secondary | ICD-10-CM | POA: Diagnosis not present

## 2018-11-16 DIAGNOSIS — G8929 Other chronic pain: Secondary | ICD-10-CM

## 2018-11-16 DIAGNOSIS — M8580 Other specified disorders of bone density and structure, unspecified site: Secondary | ICD-10-CM | POA: Diagnosis not present

## 2018-11-16 DIAGNOSIS — I10 Essential (primary) hypertension: Secondary | ICD-10-CM | POA: Diagnosis not present

## 2018-11-16 DIAGNOSIS — R7303 Prediabetes: Secondary | ICD-10-CM

## 2018-11-16 DIAGNOSIS — Z7189 Other specified counseling: Secondary | ICD-10-CM

## 2018-11-16 DIAGNOSIS — E669 Obesity, unspecified: Secondary | ICD-10-CM

## 2018-11-16 NOTE — Assessment & Plan Note (Signed)
Preventative protocols reviewed and updated unless pt declined. Discussed healthy diet and lifestyle.  

## 2018-11-16 NOTE — Assessment & Plan Note (Signed)
Encouraged healthy diet and lifestyle changes to affect sustainable weight loss.  

## 2018-11-16 NOTE — Assessment & Plan Note (Addendum)
Upcoming DEXA this year.

## 2018-11-16 NOTE — Assessment & Plan Note (Signed)
Discussed limiting added sugars in diet. On metformin for PCOS.

## 2018-11-16 NOTE — Patient Instructions (Addendum)
You will be due for colonoscopy later this year. Let me know if you'd like referral.  We will watch ankle pain - likely arthritis related.  Bring [place] copy of your advanced directive when completed. You are doing well today. Return as needed or in 1 year for next physical/wellnes visit  Health Maintenance After Age 69 After age 71, you are at a higher risk for certain long-term diseases and infections as well as injuries from falls. Falls are a major cause of broken bones and head injuries in people who are older than age 58. Getting regular preventive care can help to keep you healthy and well. Preventive care includes getting regular testing and making lifestyle changes as recommended by your health care provider. Talk with your health care provider about:  Which screenings and tests you should have. A screening is a test that checks for a disease when you have no symptoms.  A diet and exercise plan that is right for you. What should I know about screenings and tests to prevent falls? Screening and testing are the best ways to find a health problem early. Early diagnosis and treatment give you the best chance of managing medical conditions that are common after age 62. Certain conditions and lifestyle choices may make you more likely to have a fall. Your health care provider may recommend:  Regular vision checks. Poor vision and conditions such as cataracts can make you more likely to have a fall. If you wear glasses, make sure to get your prescription updated if your vision changes.  Medicine review. Work with your health care provider to regularly review all of the medicines you are taking, including over-the-counter medicines. Ask your health care provider about any side effects that may make you more likely to have a fall. Tell your health care provider if any medicines that you take make you feel dizzy or sleepy.  Osteoporosis screening. Osteoporosis is a condition that causes the bones to get  weaker. This can make the bones weak and cause them to break more easily.  Blood pressure screening. Blood pressure changes and medicines to control blood pressure can make you feel dizzy.  Strength and balance checks. Your health care provider may recommend certain tests to check your strength and balance while standing, walking, or changing positions.  Foot health exam. Foot pain and numbness, as well as not wearing proper footwear, can make you more likely to have a fall.  Depression screening. You may be more likely to have a fall if you have a fear of falling, feel emotionally low, or feel unable to do activities that you used to do.  Alcohol use screening. Using too much alcohol can affect your balance and may make you more likely to have a fall. What actions can I take to lower my risk of falls? General instructions  Talk with your health care provider about your risks for falling. Tell your health care provider if: ? You fall. Be sure to tell your health care provider about all falls, even ones that seem minor. ? You feel dizzy, sleepy, or off-balance.  Take over-the-counter and prescription medicines only as told by your health care provider. These include any supplements.  Eat a healthy diet and maintain a healthy weight. A healthy diet includes low-fat dairy products, low-fat (lean) meats, and fiber from whole grains, beans, and lots of fruits and vegetables. Home safety  Remove any tripping hazards, such as rugs, cords, and clutter.  Install safety equipment such as  grab bars in bathrooms and safety rails on stairs.  Keep rooms and walkways well-lit. Activity   Follow a regular exercise program to stay fit. This will help you maintain your balance. Ask your health care provider what types of exercise are appropriate for you.  If you need a cane or walker, use it as recommended by your health care provider.  Wear supportive shoes that have nonskid soles. Lifestyle  Do  not drink alcohol if your health care provider tells you not to drink.  If you drink alcohol, limit how much you have: ? 0-1 drink a day for women. ? 0-2 drinks a day for men.  Be aware of how much alcohol is in your drink. In the [place] one drink equals one typical bottle of beer (12 oz), one-half glass of wine (5 oz), or one shot of hard liquor (1 oz).  Do not use any products that contain nicotine or tobacco, such as cigarettes and e-cigarettes. If you need help quitting, ask your health care provider. Summary  Having a healthy lifestyle and getting preventive care can help to protect your health and wellness after age 18.  Screening and testing are the best way to find a health problem early and help you avoid having a fall. Early diagnosis and treatment give you the best chance for managing medical conditions that are more common for people who are older than age 90.  Falls are a major cause of broken bones and head injuries in people who are older than age 73. Take precautions to prevent a fall at home.  Work with your health care provider to learn what changes you can make to improve your health and wellness and to prevent falls. This information is not intended to replace advice given to you by your health care provider. Make sure you discuss any questions you have with your health care provider. Document Released: 08/27/2017 Document Revised: 08/27/2017 Document Reviewed: 08/27/2017 Elsevier Interactive Patient Education  2019 [employer].

## 2018-11-16 NOTE — Assessment & Plan Note (Signed)
Advanced directive discussion -received advanced directive packet last month - working on this.husband would be HCPOA then daughter [name].

## 2018-11-16 NOTE — Assessment & Plan Note (Signed)
Chronic, stable off meds.  The 10-year ASCVD risk score Mikey Bussing DC Brooke Bonito., et al., 2013) is: 9%   Values used to calculate the score:     Age: 68 years     Sex: Female     Is Non-Hispanic African American: No     Diabetic: No     Tobacco smoker: No     Systolic Blood Pressure: 820 mmHg     Is BP treated: Yes     HDL Cholesterol: 61.3 mg/dL     Total Cholesterol: 217 mg/dL

## 2018-11-16 NOTE — Assessment & Plan Note (Signed)
Anticipate arthritis related.

## 2018-11-16 NOTE — Progress Notes (Signed)
BP 120/80 (BP Location: Left Arm, Patient Position: Sitting, Cuff Size: Large)   Pulse 79   Temp 97.8 F (36.6 C) (Oral)   Ht 5' 1.75" (1.568 m)   Wt 181 lb 8 oz (82.3 kg)   SpO2 99%   BMI 33.47 kg/m    CC: CPE Subjective:    Patient ID: [name], female    DOB: 1951-10-19, 68 y.o.   MRN: [id number]  HPI: Cynthia Roberson is a 68 y.o. female presenting on 11/16/2018 for Annual Exam   Saw [name] last week for medicare wellness visit. Note reviewed.   Son recently moved out to [place].   Ongoing R ankle and achilles pain after closed nondisplaced R lateral malleolar fracture suffered last year.   Sees endo for PCOS, hyperandrogenism, hypothyroidism.   Preventative: COLONOSCOPY 2015 h/o polyps, rpt 5 yrs (Outlaw)  Lung cancer screening -not eligible  Well woman - sees Dr Fannie Knee- aged out of cervical cancer screening  Breast cancer screening -[facility]2017 DEXA - h/o osteopenia, unsure date - followed by OBGYNplanning to repeat this year.  Flu shot -yearly Tdap 2012 prevnar 2016, pneumovax 2018  zostavax -[facility]  shingrix - completed 2 shot series  Advanced directive discussion -received advanced directive packet last month - working on this.husband would be HCPOA then daughter Cynthia Roberson.  Seat belt use discussed  Sunscreen use discussed, no changing moles on skin - sees derm  yearly [name]) Nonsmoker. + second hand smoke.  Alcohol - rare Dentist q6 mo  Eye exam yearly Cynthia Roberson)  Lives with husband [name]  Occ: retired, was middle Research scientist (physical sciences) Activity: stays active caring for grandchildren - planning to use new treadmill daily  Diet: good water, fruits/vegetables daily     Relevant past medical, surgical, family and social history reviewed and updated as indicated. Interim medical history since our last visit reviewed. Allergies and medications reviewed and updated. Outpatient Medications Prior to Visit    Medication Sig Dispense Refill  . Biotin 5 MG TABS Take 5 mg by mouth daily.     . Calcium Carb-Cholecalciferol (CALTRATE 600+D3 SOFT PO) Take by mouth.    . Calcium Carbonate 500 MG CHEW Chew 2 tablets by mouth daily.     . Cholecalciferol (VITAMIN D) 2000 UNITS tablet Take 4,000 Units by mouth daily.     . cycloSPORINE (RESTASIS OP) Apply 1 drop to eye 2 (two) times daily.    . fluticasone (FLONASE) 50 MCG/ACT nasal spray Place 2 sprays into both nostrils daily.    [name] glucose blood (ONETOUCH VERIO) test strip Use to test sugars two times weekly 30 each 12  . loratadine (CLARITIN) 10 MG tablet Take 10 mg by mouth daily as needed.     . metFORMIN (GLUCOPHAGE-XR) 500 MG 24 hr tablet Please take 1 tab 2x a day 180 tablet 3  . Minoxidil (ROGAINE MENS) 5 % FOAM Apply topically.    . nebivolol (BYSTOLIC) 2.5 MG tablet TAKE 1 TABLET BY MOUTH DAILY 90 tablet 1  . NONFORMULARY OR COMPOUNDED ITEM 2 capsules 2 (two) times daily. Hydro-eye    . ONETOUCH DELICA LANCETS 37S MISC Use to test sugars 2 times weekly 30 each 11  . SYNTHROID 50 MCG tablet TAKE 1 TABLET BY MOUTH EVERY DAY BEFORE BREAKFAST 90 tablet 3  . Turmeric Curcumin 500 MG CAPS Take 1 capsule by mouth daily.    . vitamin B-12 (CYANOCOBALAMIN) 500 MCG tablet Take 500 mcg by mouth daily.    [name]  vitamin E 400 UNIT capsule Take 400 Units by mouth daily.    . Zinc 50 MG TABS Take 1 tablet by mouth daily.     No facility-administered medications prior to visit.      Per HPI unless specifically indicated in ROS section below Review of Systems  Constitutional: Negative for activity change, appetite change, chills, fatigue, fever and unexpected weight change.  HENT: Negative for hearing loss.   Eyes: Negative for visual disturbance.  Respiratory: Positive for cough (attributes to sugar sensitivity). Negative for chest tightness, shortness of breath and wheezing.   Cardiovascular: Negative for chest pain, palpitations and leg swelling.   Gastrointestinal: Positive for diarrhea (food related). Negative for abdominal distention, abdominal pain, blood in stool, constipation, nausea and vomiting.  Genitourinary: Negative for difficulty urinating and hematuria.  Musculoskeletal: Positive for arthralgias. Negative for myalgias and neck pain.  Skin: Negative for rash.  Neurological: Negative for dizziness, seizures, syncope and headaches.  Hematological: Negative for adenopathy. Does not bruise/bleed easily.  Psychiatric/Behavioral: Negative for dysphoric mood. The patient is not nervous/anxious.    Objective:    BP 120/80 (BP Location: Left Arm, Patient Position: Sitting, Cuff Size: Large)   Pulse 79   Temp 97.8 F (36.6 C) (Oral)   Ht 5' 1.75" (1.568 m)   Wt 181 lb 8 oz (82.3 kg)   SpO2 99%   BMI 33.47 kg/m   Wt Readings from Last 3 Encounters:  11/16/18 181 lb 8 oz (82.3 kg)  11/12/18 182 lb 8 oz (82.8 kg)  07/24/18 178 lb (80.7 kg)    Physical Exam Vitals signs and nursing note reviewed.  Constitutional:      General: She is not in acute distress.    Appearance: She is well-developed.  HENT:     Head: Normocephalic and atraumatic.     Right Ear: Hearing, tympanic membrane, ear canal and external ear normal.     Left Ear: Hearing, tympanic membrane, ear canal and external ear normal.     Nose: Nose normal.     Mouth/Throat:     Mouth: Mucous membranes are moist.     Pharynx: Oropharynx is clear. Uvula midline. No oropharyngeal exudate or posterior oropharyngeal erythema.  Eyes:     General: No scleral icterus.    Conjunctiva/sclera: Conjunctivae normal.     Pupils: Pupils are equal, round, and reactive to light.  Neck:     Musculoskeletal: Normal range of motion and neck supple.     Thyroid: No thyromegaly.     Vascular: No carotid bruit.  Cardiovascular:     Rate and Rhythm: Normal rate and regular rhythm.     Pulses:          Radial pulses are 2+ on the right side and 2+ on the left side.     Heart  sounds: Normal heart sounds. No murmur.  Pulmonary:     Effort: Pulmonary effort is normal. No respiratory distress.     Breath sounds: Normal breath sounds. No wheezing or rales.  Abdominal:     General: Bowel sounds are normal. There is no distension.     Palpations: Abdomen is soft. There is no mass.     Tenderness: There is no abdominal tenderness. There is no guarding or rebound.  Musculoskeletal: Normal range of motion.  Lymphadenopathy:     Cervical: No cervical adenopathy.  Skin:    General: Skin is warm and dry.     Findings: No rash.  Neurological:  Mental Status: She is alert and oriented to person, place, and time.     Comments: CN grossly intact, station and gait intact  Psychiatric:        Mood and Affect: Mood normal.        Behavior: Behavior normal.        Thought Content: Thought content normal.        Judgment: Judgment normal.       Results for orders placed or performed in visit on 11/12/18  CBC with Differential/Platelet  Result Value Ref Range   WBC 5.2 4.0 - 10.5 K/uL   RBC 5.66 (H) 3.87 - 5.11 Mil/uL   Hemoglobin 14.7 12.0 - 15.0 g/dL   HCT 44.6 36.0 - 46.0 %   MCV 78.7 78.0 - 100.0 fl   MCHC 33.0 30.0 - 36.0 g/dL   RDW 14.3 11.5 - 15.5 %   Platelets 235.0 150.0 - 400.0 K/uL   Neutrophils Relative % 51.7 43.0 - 77.0 %   Lymphocytes Relative 37.4 12.0 - 46.0 %   Monocytes Relative 7.1 3.0 - 12.0 %   Eosinophils Relative 3.0 0.0 - 5.0 %   Basophils Relative 0.8 0.0 - 3.0 %   Neutro Abs 2.7 1.4 - 7.7 K/uL   Lymphs Abs 1.9 0.7 - 4.0 K/uL   Monocytes Absolute 0.4 0.1 - 1.0 K/uL   Eosinophils Absolute 0.2 0.0 - 0.7 K/uL   Basophils Absolute 0.0 0.0 - 0.1 K/uL  Comprehensive metabolic panel  Result Value Ref Range   Sodium 139 135 - 145 mEq/L   Potassium 5.3 No hemolysis seen (H) 3.5 - 5.1 mEq/L   Chloride 103 96 - 112 mEq/L   CO2 32 19 - 32 mEq/L   Glucose, Bld 114 (H) 70 - 99 mg/dL   BUN 15 6 - 23 mg/dL   Creatinine, Ser 0.98 0.40 - 1.20  mg/dL   Total Bilirubin 0.5 0.2 - 1.2 mg/dL   Alkaline Phosphatase 48 39 - 117 U/L   AST 21 0 - 37 U/L   ALT 28 0 - 35 U/L   Total Protein 6.9 6.0 - 8.3 g/dL   Albumin 4.3 3.5 - 5.2 g/dL   Calcium 9.8 8.4 - 10.5 mg/dL   GFR 59.87 (L) >60.00 mL/min  Lipid Panel  Result Value Ref Range   Cholesterol 217 (H) 0 - 200 mg/dL   Triglycerides 106.0 0.0 - 149.0 mg/dL   HDL 61.30 >39.00 mg/dL   VLDL 21.2 0.0 - 40.0 mg/dL   LDL Cholesterol 135 (H) 0 - 99 mg/dL   Total CHOL/HDL Ratio 4    NonHDL 155.85    Lab Results  Component Value Date   TSH 1.36 07/24/2018    Assessment & Plan:   Problem List Items Addressed This Visit    Routine general medical examination at a health care facility - Primary    Preventative protocols reviewed and updated unless pt declined. Discussed healthy diet and lifestyle.       Right ankle pain    Anticipate arthritis related.       Prediabetes    Discussed limiting added sugars in diet. On metformin for PCOS.       Osteopenia    Upcoming DEXA this year.       Obesity (BMI 30.0-34.9)    Encouraged healthy diet and lifestyle changes to affect sustainable weight loss.       Essential hypertension, benign    Chronic, stable. Continue low dose bystolic.  Dyslipidemia    Chronic, stable off meds.  The 10-year ASCVD risk score Mikey Bussing DC Brooke Bonito., et al., 2013) is: 9%   Values used to calculate the score:     Age: 42 years     Sex: Female     Is Non-Hispanic African American: No     Diabetic: No     Tobacco smoker: No     Systolic Blood Pressure: 474 mmHg     Is BP treated: Yes     HDL Cholesterol: 61.3 mg/dL     Total Cholesterol: 217 mg/dL       Advanced care planning/counseling discussion    Advanced directive discussion -received advanced directive packet last month - working on this.husband would be HCPOA then daughter Cynthia Roberson.           No orders of the defined types were placed in this encounter.  No orders of the defined  types were placed in this encounter.   Follow up plan: Return in about 1 year (around 11/17/2019) for annual exam, prior fasting for blood work, medicare wellness visit.  Ria Bush, MD

## 2018-11-16 NOTE — Assessment & Plan Note (Addendum)
Chronic, stable. Continue low dose bystolic.

## 2019-01-16 NOTE — Progress Notes (Signed)
I reviewed health advisor's note, was available for consultation, and agree with documentation and plan.  

## 2019-01-23 ENCOUNTER — Other Ambulatory Visit: Payer: Self-pay | Admitting: Family Medicine

## 2019-01-27 ENCOUNTER — Telehealth: Payer: Medicare Other | Admitting: Family

## 2019-01-27 DIAGNOSIS — J019 Acute sinusitis, unspecified: Secondary | ICD-10-CM

## 2019-01-27 MED ORDER — DOXYCYCLINE HYCLATE 100 MG PO TABS: 100.0000 mg | ORAL_TABLET | Freq: Two times a day (BID) | ORAL | 0 refills | Status: DC

## 2019-01-27 NOTE — Progress Notes (Signed)
We are sorry that you are not feeling well.  Here is how we plan to help!  Based on what you have shared with me it looks like you have sinusitis.  Sinusitis is inflammation and infection in the sinus cavities of the head.  Based on your presentation I believe you most likely have Acute Bacterial Sinusitis.  This is an infection caused by bacteria and is treated with antibiotics. I have prescribed Doxycycline 100mg  by mouth twice a day for 10 days. You may use an oral decongestant such as Mucinex D or if you have glaucoma or high blood pressure use plain Mucinex. Saline nasal spray help and can safely be used as often as needed for congestion.  If you develop worsening sinus pain, fever or notice severe headache and vision changes, or if symptoms are not better after completion of antibiotic, please schedule an appointment with a health care provider.    Sinus infections are not as easily transmitted as other respiratory infection, however we still recommend that you avoid close contact with loved ones, especially the very young and elderly.  Remember to wash your hands thoroughly throughout the day as this is the number one way to prevent the spread of infection!  Approximately 5 minutes was spent documenting and reviewing patient's chart.   Home Care:  Only take medications as instructed by your medical team.  Complete the entire course of an antibiotic.  Do not take these medications with alcohol.  A steam or ultrasonic humidifier can help congestion.  You can place a towel over your head and breathe in the steam from hot water coming from a faucet.  Avoid close contacts especially the very young and the elderly.  Cover your mouth when you cough or sneeze.  Always remember to wash your hands.  Get Help Right Away If:  You develop worsening fever or sinus pain.  You develop a severe head ache or visual changes.  Your symptoms persist after you have completed your treatment  plan.  Make sure you  Understand these instructions.  Will watch your condition.  Will get help right away if you are not doing well or get worse.  Your e-visit answers were reviewed by a board certified advanced clinical practitioner to complete your personal care plan.  Depending on the condition, your plan could have included both over the counter or prescription medications.  If there is a problem please reply  once you have received a response from your provider.  Your safety is important to [place].  If you have drug allergies check your prescription carefully.    You can use MyChart to ask questions about today's visit, request a non-urgent call back, or ask for a work or school excuse for 24 hours related to this e-Visit. If it has been greater than 24 hours you will need to follow up with your provider, or enter a new e-Visit to address those concerns.  You will get an e-mail in the next two days asking about your experience.  I hope that your e-visit has been valuable and will speed your recovery. Thank you for using e-visits.

## 2019-07-28 ENCOUNTER — Other Ambulatory Visit: Payer: Self-pay

## 2019-07-30 ENCOUNTER — Ambulatory Visit: Payer: Medicare Other | Admitting: Internal Medicine

## 2019-07-30 NOTE — Progress Notes (Signed)
Canceled last minute  Patient ID: [name], female   DOB: November 24, 1950, 68 y.o.   MRN: [id number]   HPI  Cynthia Roberson is a 68 y.o.-year-old female, returning for post menopausal hyperandrogenism, hypothyroidism, prediabetes.  Last visit 1 year ago.  Reviewed previous history: Patient had a long history of hyperandrogenism (high testosterone level), and has a history of PCOS + infertility. She could not tolerate OCPs in the past (migraines).   Reviewed the records from Dr. Howell Rucks:  The patient has a history of scalp hair thinning since her younger years, for which she has been using prescription strength, Rogaine with good results. She is using it only once daily and hair thinning has slowed down. She also has noticed increased hair growth over her face, shoulders, chest, abdomen, and change in the texture of these hairs over past several years.  No acne.  She is postmenopausal since ~ age 25. She had a always had a history of irregular menses and had been on birth control in the past. She had difficulty with conception and had used Clomid. She has had 2 successful pregnancies in the past. Following this, the patient had regular menses for some time until she attained menopause.  No signs of masculinization.  Testosterone level - elevated at 130 in 2013.  Subsequent levels were still high, however, all of the levels below were drawn while on spironolactone. We stopped checking her testosterone levels after 03/2015, when she was on spironolactone Component     Latest Ref Rng 07/06/2014 07/14/2014 10/11/2014 04/26/2015  Testosterone     3 - 41 ng/dL 173 (H)  97 (H)   Sex Hormone Binding     18 - 114 nmol/L 31     Testosterone Free     0.0 - 4.2 pg/mL 33.8 (H)  6.1 (H)   Testosterone-% Free     0.4 - 2.4 % 2.0     FSH      26.1     LH      20.3     DHEA-SO4     29.4 - 220.5 ug/dL  77.0    Testosterone, total     7.0 - 40.0 ng/dL    122.7 (H)   Other previous work-up: - Ruled  out for Cushing's syndrome based on 1 mg dex suppression test- morning cortisol is 1.47 (05/2013) - DHEAS 44.3, Prolactin 5.9 (05/2013) - CT Abdomen and pelvis with contrast (04/2013) - No adrenal mass or any ovarian abnormality - Ovarian US was negative (06/2014) At last visit with Dr.Phadke, the use of flutamide was discussed, as well as the possibility of oophorectomy.  She is also seeing Dr Ronita Hipps [facility]) and Dr Ubaldo Glassing (derm).  She has been on spironolactone since 05/2013 with mild elevation in her potassium levels so the dose of spironolactone was maintained low, 25 mg daily.  We stopped spironolactone in 2017.  She noticed significantly less hair on chin especially after she started to ask every 3 weeks.  Her testosterone level was initially normal after we stopped spironolactone, but then increased at last visit:  Component     Latest Ref Rng & Units 07/24/2018  Testosterone, Serum (Total)     ng/dL 92 (H)  % Free Testosterone     % 1.2  Free Testosterone, S     pg/mL 11 (H)  Sex Hormone Binding Globulin     nmol/L 50.3   Component     Latest Ref Rng & Units 07/24/2017  Testosterone  3 - 41 ng/dL 82 (H)  Testosterone Free     0.0 - 4.2 pg/mL 2.8  Sex Horm Binding Glob, Serum     17.3 - 125.0 nmol/L 50.8   She continues on minoxidil for scalp hair loss.  Hypothyroidism -well controlled -Diagnosed in 1995 -She is on brand name Synthroid for the last 16 years -We tried to switch to generic levothyroxine but she could not tolerate it due to palpitations  She is on Synthroid 50 mcg daily - in am - fasting - at least 30 min from b'fast - no Ca, Fe, MVI, PPIs - not on Biotin (was on this in the past)  Latest TSH level was normal Lab Results  Component Value Date   TSH 1.36 07/24/2018   Prediabetes.  -She could not tolerate regular metformin in the past -Currently on metformin ER 1000 mg daily  Latest HbA1c in the prediabetic range: Lab Results  Component Value  Date   HGBA1C 6.0 07/24/2018   HGBA1C 5.9 11/07/2017   HGBA1C 5.8 07/24/2017   HGBA1C 5.9 02/21/2017   HGBA1C 5.9 07/25/2016   HGBA1C 6.1 01/23/2016   HGBA1C 6.0 02/09/2015   HGBA1C 6.3 10/11/2014   HGBA1C 6.2 07/06/2014   HGBA1C 5.7 01/11/2014   HGBA1C 5.6 06/10/2013   Last eye exam: 06/2016: No DR  She was previously on ASA 81-started per Dr. Alvy Bimler >> bruising, petechiae >> stopped.  ROS: Constitutional: no weight gain/no weight loss, no fatigue, no subjective hyperthermia, no subjective hypothermia Eyes: no blurry vision, no xerophthalmia ENT: no sore throat, no nodules palpated in neck, no dysphagia, no odynophagia, no hoarseness Cardiovascular: no CP/no SOB/no palpitations/no leg swelling Respiratory: no cough/no SOB/no wheezing Gastrointestinal: no N/no V/no D/no C/no acid reflux Musculoskeletal: no muscle aches/no joint aches Skin: no rashes, + hair loss on scalp and excess hair on chin Neurological: no tremors/no numbness/no tingling/no dizziness  I reviewed pt's medications, allergies, PMH, social hx, family hx, and changes were documented in the history of present illness. Otherwise, unchanged from my initial visit note.  Past Medical History:  Diagnosis Date  . Clostridium difficile infection 2013  . Hirsutism   . Hyperandrogenemia   . Hypertension   . Osteopenia    DEXA 2016  . Other alopecia   . Polycystic ovaries   . Unspecified hypothyroidism    Past Surgical History:  Procedure Laterality Date  . COLONOSCOPY  2015   h/o polyps, rpt 5 yrs ([place])  . COLONOSCOPY  2011   adenomatous polyp, rpt 5 yrs ([place])  . TONSILLECTOMY    . WISDOM TOOTH EXTRACTION     Social History   Social History  . Marital Status: Married    Spouse Name: N/A  . Number of Children: 2   Occupational History  . Retired Pharmacist, hospital, now Counsellor   Social History Main Topics  . Smoking status: Never Smoker   . Smokeless tobacco: Not on file  . Alcohol  Use:     1-3 Glasses of wine per year  . Drug Use: No  . Sexual Activity: Yes   Current Outpatient Medications on File Prior to Visit  Medication Sig Dispense Refill  . Biotin 5 MG TABS Take 5 mg by mouth daily.     . Calcium Carb-Cholecalciferol (CALTRATE 600+D3 SOFT PO) Take by mouth.    . Calcium Carbonate 500 MG CHEW Chew 2 tablets by mouth daily.     . Cholecalciferol (VITAMIN D) 2000 UNITS tablet Take 4,000 Units by mouth  daily.     . cycloSPORINE (RESTASIS OP) Apply 1 drop to eye 2 (two) times daily.    [name] doxycycline (VIBRA-TABS) 100 MG tablet Take 1 tablet (100 mg total) by mouth 2 (two) times daily. 20 tablet 0  . fluticasone (FLONASE) 50 MCG/ACT nasal spray Place 2 sprays into both nostrils daily.    [name] glucose blood (ONETOUCH VERIO) test strip Use to test sugars two times weekly 30 each 12  . loratadine (CLARITIN) 10 MG tablet Take 10 mg by mouth daily as needed.     . metFORMIN (GLUCOPHAGE-XR) 500 MG 24 hr tablet Please take 1 tab 2x a day 180 tablet 3  . Minoxidil (ROGAINE MENS) 5 % FOAM Apply topically.    . nebivolol (BYSTOLIC) 2.5 MG tablet TAKE 1 TABLET BY MOUTH DAILY 90 tablet 3  . NONFORMULARY OR COMPOUNDED ITEM 2 capsules 2 (two) times daily. Hydro-eye    . ONETOUCH DELICA LANCETS 99991111 MISC Use to test sugars 2 times weekly 30 each 11  . SYNTHROID 50 MCG tablet TAKE 1 TABLET BY MOUTH EVERY DAY BEFORE BREAKFAST 90 tablet 3  . Turmeric Curcumin 500 MG CAPS Take 1 capsule by mouth daily.    . vitamin B-12 (CYANOCOBALAMIN) 500 MCG tablet Take 500 mcg by mouth daily.    . vitamin E 400 UNIT capsule Take 400 Units by mouth daily.    . Zinc 50 MG TABS Take 1 tablet by mouth daily.     No current facility-administered medications on file prior to visit.    Allergies  Allergen Reactions  . Amoxicillin Rash  . Levothyroxine Palpitations    With generic levothyroxine, tolerates well the brand name.  . Statins Palpitations    Tried Crestor and others  . Sulfa Antibiotics  Rash   Family History  Problem Relation Age of Onset  . Heart failure Mother   . Cancer Mother 32       lung  . Heart disease Father   . Stroke Father   . Hypertension Father    PE: There were no vitals taken for this visit. Wt Readings from Last 3 Encounters:  11/16/18 181 lb 8 oz (82.3 kg)  11/12/18 182 lb 8 oz (82.8 kg)  07/24/18 178 lb (80.7 kg)   Constitutional: overweight, in NAD Eyes: PERRLA, EOMI, no exophthalmos ENT: moist mucous membranes, no thyromegaly, no cervical lymphadenopathy Cardiovascular: RRR, No MRG Respiratory: CTA B Gastrointestinal: abdomen soft, NT, ND, BS+ Musculoskeletal: no deformities, strength intact in all 4 Skin: moist, warm, no rashes, + female pattern baldness Neurological: no tremor with outstretched hands, DTR normal in all 4  ASSESSMENT: 1. High testosterone level in a postmenopausal woman/history of PCOS - She discussed with Dr. Howell Rucks and Dr. Ronita Hipps before about the possibility of performing oophorectomy to look for ovarian hyperthecosis or small hilar ovarian tumors. She refused. We could continue to just follow her clinically and by labs for now.  2. Hypothyroidism  3. Prediabetes  PLAN:  1. High testosterone level in a postmenopausal woman/likely due to history of PCOS Patient with history of hyperandrogenism due to most likely previously undiagnosed PCOS or possibly secondary to resistance (less likely).  Adrenal tumors, ovarian tumors, or ovarian hyperthecosis are less likely for her since these occur with usually higher levels of testosterone, higher than 150 ng/mL.  Patient had repeated CT imaging of her adrenals and also transvaginal ultrasound for evaluation of her ovaries and this test were all negative for any masses or hyperplasia.  Her hormonal testing was also negative for hyperprolactinemia, pituitary dysfunction, Cushing syndrome. -She was previously on spironolactone but we stopped before last visit and she felt better after  we stopped it.  Her hair continues to improve on chin especially after she started boxing. -At last visit, we checked a testosterone level and this was slightly high.  We will repeat this today. 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 Hypothyroidism - latest thyroid labs reviewed with pt >> normal 07/2018 - she continues on Synthroid 50 mcg daily - pt feels good on this dose. - we discussed about taking the thyroid hormone every day, with water, >30 minutes before breakfast, separated by >4 hours from acid reflux medications, calcium, iron, multivitamins. Pt. is taking it correctly. - will check thyroid tests today: TSH and fT4 - If labs are abnormal, she will need to return for repeat TFTs in 1.5 months  3. Prediabetes -Continues metformin ER 1000 mg with breakfast or lunch - HbA1c today was ** -We will give her the flu shot today  Needs refills of levothyroxine.    Philemon Kingdom, MD PhD [facility] End[facility]

## 2019-08-12 ENCOUNTER — Encounter: Payer: Self-pay | Admitting: Internal Medicine

## 2019-09-01 ENCOUNTER — Other Ambulatory Visit: Payer: Self-pay

## 2019-09-01 ENCOUNTER — Ambulatory Visit: Payer: Medicare Other | Admitting: Internal Medicine

## 2019-09-01 ENCOUNTER — Encounter: Payer: Self-pay | Admitting: Internal Medicine

## 2019-09-01 VITALS — BP 130/92 | HR 85 | Ht 61.75 in | Wt 182.0 lb

## 2019-09-01 DIAGNOSIS — R7303 Prediabetes: Secondary | ICD-10-CM

## 2019-09-01 DIAGNOSIS — Z23 Encounter for immunization: Secondary | ICD-10-CM | POA: Diagnosis not present

## 2019-09-01 DIAGNOSIS — E039 Hypothyroidism, unspecified: Secondary | ICD-10-CM | POA: Diagnosis not present

## 2019-09-01 DIAGNOSIS — E281 Androgen excess: Secondary | ICD-10-CM

## 2019-09-01 LAB — POCT GLYCOSYLATED HEMOGLOBIN (HGB A1C): Hemoglobin A1C: 5.5 % (ref 4.0–5.6)

## 2019-09-01 LAB — TSH: TSH: 2.27 u[IU]/mL (ref 0.35–4.50)

## 2019-09-01 LAB — T4, FREE: Free T4: 1.06 ng/dL (ref 0.60–1.60)

## 2019-09-01 NOTE — Progress Notes (Signed)
Patient ID: Cynthia Roberson, female   DOB: Nov 02, 1950, 68 y.o.   MRN: [id number]   HPI  Cynthia Roberson is a 68 y.o.-year-old female, returning for post menopausal hyperandrogenism, hypothyroidism, prediabetes. Last visit with me 1 year and 2 months ago.  She had a busy year.  Son and daughter-in-law moved to [place].  She now watches her grandchildren while they are in virtual school.  She started intermittent fasting.   Reviewed and addended history: Patient had a long history of hyperandrogenism (high testosterone level), and has a history of PCOS + infertility. She could not tolerate OCPs in the past (migraines).   Reviewed the records from Dr. Howell Rucks:  The patient has a history of scalp hair thinning since her younger years, for which she has been using prescription strength, Rogaine with good results. She is using it only once daily and hair thinning has slowed down. She also has noticed increased hair growth over her face, shoulders, chest, abdomen, and change in the texture of these hairs over past several years.  No acne.  She is postmenopausal since ~ age 61. She had a always had a history of irregular menses and had been on birth control in the past. She had difficulty with conception and had used Clomid. She has had 2 successful pregnancies in the past. Following this, the patient had regular menses for some time until she attained menopause.  No signs of masculinization.  Testosterone level - elevated at 130 in 2013.  Subsequent levels were still high, however, all of the levels below were drawn while on spironolactone. We stopped checking her testosterone levels after 03/2015, when she was on spironolactone Component     Latest Ref Rng 07/06/2014 07/14/2014 10/11/2014 04/26/2015  Testosterone     3 - 41 ng/dL 173 (H)  97 (H)   Sex Hormone Binding     18 - 114 nmol/L 31     Testosterone Free     0.0 - 4.2 pg/mL 33.8 (H)  6.1 (H)   Testosterone-% Free     0.4 - 2.4 % 2.0      FSH      26.1     LH      20.3     DHEA-SO4     29.4 - 220.5 ug/dL  77.0    Testosterone, total     7.0 - 40.0 ng/dL    122.7 (H)   Reviewed that the previous work-up: - Ruled out for Cushing's syndrome based on 1 mg dex suppression test- morning cortisol is 1.47 (05/2013) - DHEAS 44.3, Prolactin 5.9 (05/2013) - CT Abdomen and pelvis with contrast (04/2013) - No adrenal mass or any ovarian abnormality - Ovarian US was negative (06/2014) At last visit with Dr.Phadke, the use of flutamide was discussed, as well as the possibility of oophorectomy.  She is also seeing Dr Ronita Hipps [facility]) and Dr Ubaldo Glassing (derm).  She has been on spironolactone since 05/2013 with mild elevation in her potassium levels so the dose of spironolactone was maintained low, 25 mg daily.  Approximately 2 years ago we both decided to stop spironolactone and she started to feel better after we did so.  She noticed significantly less hair on chin especially after she started to wax every 3 weeks.  Now she is not waxing anymore due to the coronavirus pandemic.    Her free testosterone level was initially normal off spironolactone, but at last visit this was slightly higher  Component     Latest Ref  Rng & Units 07/24/2018  Testosterone, Serum (Total)     ng/dL 92 (H)  % Free Testosterone     % 1.2  Free Testosterone, S     pg/mL 11 (H)  Sex Hormone Binding Globulin     nmol/L 50.3   Component     Latest Ref Rng & Units 07/24/2017  Testosterone     3 - 41 ng/dL 82 (H)  Testosterone Free     0.0 - 4.2 pg/mL 2.8  Sex Horm Binding Glob, Serum     17.3 - 125.0 nmol/L 50.8   Latest potassium level was slightly elevated: Lab Results  Component Value Date   K 5.3 No hemolysis seen (H) 11/12/2018   K 4.7 11/07/2017   K 5.1 06/04/2017   K 5.2 02/21/2017   K 5.5 (H) 11/06/2016   She continues on minoxidil for scalp hair loss.  Hypothyroidism, well controlled - dx in 1995 -She continues on Synthroid d.a.w. 50 mcg  daily-stable dose for the last 16 years -We tried to switch to generic levothyroxine but she could not tolerate it due to palpitations  Pt is on Synthroid 50 mcg daily, taken: - in am - fasting - at least 30 min from b'fast - no Ca, Fe, MVI, PPIs - not on Biotin  Latest TSH was normal Lab Results  Component Value Date   TSH 1.36 07/24/2018   Prediabetes.  - She did not tolerate  regular metformin in the past >> she continues on Metformin ER 1000 mg daily with brunch.  Her HbA1c levels were in the prediabetic range: Lab Results  Component Value Date   HGBA1C 6.0 07/24/2018   HGBA1C 5.9 11/07/2017   HGBA1C 5.8 07/24/2017   HGBA1C 5.9 02/21/2017   HGBA1C 5.9 07/25/2016   HGBA1C 6.1 01/23/2016   HGBA1C 6.0 02/09/2015   HGBA1C 6.3 10/11/2014   HGBA1C 6.2 07/06/2014   HGBA1C 5.7 01/11/2014   HGBA1C 5.6 06/10/2013   Last eye exam: 06/2016: No DR  She started ASA 81 per Dr. Alvy Bimler >> bruising, petechiae >> stopped.  ROS: Constitutional: + weight gain/no weight loss, no fatigue, no subjective hyperthermia, no subjective hypothermia Eyes: no blurry vision, no xerophthalmia ENT: no sore throat, no nodules palpated in neck, no dysphagia, no odynophagia, no hoarseness Cardiovascular: no CP/no SOB/no palpitations/no leg swelling Respiratory: no cough/no SOB/no wheezing Gastrointestinal: no N/no V/no D/no C/no acid reflux Musculoskeletal: no muscle aches/no joint aches Skin: no rashes, + hair loss, + unwanted hair on chin Neurological: no tremors/no numbness/no tingling/no dizziness  I reviewed pt's medications, allergies, PMH, social hx, family hx, and changes were documented in the history of present illness. Otherwise, unchanged from my initial visit note.  Past Medical History:  Diagnosis Date  . Clostridium difficile infection 2013  . Hirsutism   . Hyperandrogenemia   . Hypertension   . Osteopenia    DEXA 2016  . Other alopecia   . Polycystic ovaries   .  Unspecified hypothyroidism    Past Surgical History:  Procedure Laterality Date  . COLONOSCOPY  2015   h/o polyps, rpt 5 yrs ([facility])  . COLONOSCOPY  2011   adenomatous polyp, rpt 5 yrs ([facility])  . TONSILLECTOMY    . WISDOM TOOTH EXTRACTION     Social History   Social History  . Marital Status: Married    Spouse Name: N/A  . Number of Children: 2   Occupational History  . Retired Pharmacist, hospital, now Counsellor   Social History  Main Topics  . Smoking status: Never Smoker   . Smokeless tobacco: Not on file  . Alcohol Use:     1-3 Glasses of wine per year  . Drug Use: No  . Sexual Activity: Yes   Current Outpatient Medications on File Prior to Visit  Medication Sig Dispense Refill  . Biotin 5 MG TABS Take 5 mg by mouth daily.     . Calcium Carb-Cholecalciferol (CALTRATE 600+D3 SOFT PO) Take by mouth.    . Calcium Carbonate 500 MG CHEW Chew 2 tablets by mouth daily.     . Cholecalciferol (VITAMIN D) 2000 UNITS tablet Take 4,000 Units by mouth daily.     . cycloSPORINE (RESTASIS OP) Apply 1 drop to eye 2 (two) times daily.    [name] doxycycline (VIBRA-TABS) 100 MG tablet Take 1 tablet (100 mg total) by mouth 2 (two) times daily. 20 tablet 0  . fluticasone (FLONASE) 50 MCG/ACT nasal spray Place 2 sprays into both nostrils daily.    [name] glucose blood (ONETOUCH VERIO) test strip Use to test sugars two times weekly 30 each 12  . loratadine (CLARITIN) 10 MG tablet Take 10 mg by mouth daily as needed.     . metFORMIN (GLUCOPHAGE-XR) 500 MG 24 hr tablet Please take 1 tab 2x a day 180 tablet 3  . Minoxidil (ROGAINE MENS) 5 % FOAM Apply topically.    . nebivolol (BYSTOLIC) 2.5 MG tablet TAKE 1 TABLET BY MOUTH DAILY 90 tablet 3  . NONFORMULARY OR COMPOUNDED ITEM 2 capsules 2 (two) times daily. Hydro-eye    . ONETOUCH DELICA LANCETS 99991111 MISC Use to test sugars 2 times weekly 30 each 11  . SYNTHROID 50 MCG tablet TAKE 1 TABLET BY MOUTH EVERY DAY BEFORE BREAKFAST 90 tablet 3  . Turmeric  Curcumin 500 MG CAPS Take 1 capsule by mouth daily.    . vitamin B-12 (CYANOCOBALAMIN) 500 MCG tablet Take 500 mcg by mouth daily.    . vitamin E 400 UNIT capsule Take 400 Units by mouth daily.    . Zinc 50 MG TABS Take 1 tablet by mouth daily.     No current facility-administered medications on file prior to visit.    Allergies  Allergen Reactions  . Amoxicillin Rash  . Levothyroxine Palpitations    With generic levothyroxine, tolerates well the brand name.  . Statins Palpitations    Tried Crestor and others  . Sulfa Antibiotics Rash   Family History  Problem Relation Age of Onset  . Heart failure Mother   . Cancer Mother 19       lung  . Heart disease Father   . Stroke Father   . Hypertension Father    PE: BP (!) 130/92   Pulse 85   Ht 5' 1.75" (1.568 m)   Wt 182 lb (82.6 kg)   SpO2 98%   BMI 33.56 kg/m  Wt Readings from Last 3 Encounters:  09/01/19 182 lb (82.6 kg)  11/16/18 181 lb 8 oz (82.3 kg)  11/12/18 182 lb 8 oz (82.8 kg)   Constitutional: overweight, in NAD Eyes: PERRLA, EOMI, no exophthalmos ENT: moist mucous membranes, no thyromegaly, no cervical lymphadenopathy Cardiovascular: RRR, No MRG Respiratory: CTA B Gastrointestinal: abdomen soft, NT, ND, BS+ Musculoskeletal: no deformities, strength intact in all 4 Skin: moist, warm, no rashes, + female pattern baldness, hirsutism on chin Neurological: no tremor with outstretched hands, DTR normal in all 4  ASSESSMENT: 1. High testosterone level in a postmenopausal woman/history of PCOS -  She discussed with Dr. Howell Rucks and Dr. Ronita Hipps before about the possibility of performing oophorectomy to look for ovarian hyperthecosis or small hilar ovarian tumors. She refused. We could continue to just follow her clinically and by labs for now.  2. Hypothyroidism  3. Prediabetes  PLAN:  1.  High testosterone level in a postmenopausal-likely due to preexistent PCOS Patient with history of hyperandrogenism due to most  likely previously undiagnosed PCOS or possibly secondary to insulin resistance or less likely).  Adrenal tumors, ovarian tumors, or ovarian hyperthecosis are less likely for her since these occurred with usually higher levels of testosterone, higher than 150 ng/mL.  Patient had repeated CT imaging of her adrenals, and also transvaginal ultrasound for evaluation of her ovaries and these tests were all negative for any masses or hyperplasia.  Her hormonal testing was also negative for hyperprolactinemia, pituitary dysfunction, Cushing syndrome. -She was previously on spironolactone, but we stopped this 2 years ago and she felt better after medication.  Her hair continued to improve on chin especially after she started waxing, however, she cannot do this now due to the coronavirus pandemic. -At last visit, we checked a testosterone level in the slightly high.  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  She is using this 1-2 times a day. -She is also on Metformin ER for her prediabetes and she feels that this is also helping with her hirsutism -I will see her back in a year  2. Hypothyroidism - latest thyroid labs reviewed with pt >> normal 07/2018 - she continues on Synthroid 50 mcg daily - pt feels good on this dose. - we discussed about taking the thyroid hormone every day, with water, >30 minutes before breakfast, separated by >4 hours from acid reflux medications, calcium, iron, multivitamins. Pt. is taking it correctly. - will check thyroid tests today: TSH and fT4 - If labs are abnormal, she will need to return for repeat TFTs in 1.5 months  3. Prediabetes -Continues Metformin ER 1 g with brunch.  No GI side effects. -HbA1c today was: 5.5% (better) -We will give her the flu shot today - she started walking on the treadmill  Needs refills of levothyroxine ans Metformin ER.   Component     Latest Ref  Rng & Units 07/24/2018 09/01/2019  Testosterone, Serum (Total)     ng/dL 92 (H) 121 (H)  % Free Testosterone     % 1.2 1.2  Free Testosterone, S     pg/mL 11 (H) 15 (H)  Sex Hormone Binding Globulin     nmol/L 50.3 46.5  Hemoglobin A1C     4.0 - 5.6 % 6.0 5.5  TSH     0.35 - 4.50 uIU/mL 1.36 2.27  T4,Free(Direct)     0.60 - 1.60 ng/dL 0.96 1.06   Thyroid tests are normal.  We will refill her levothyroxine.   Her HbA1c has improved. 3 testosterone level is still slightly high, but not much changed from last visit.  Philemon Kingdom, MD PhD [facility]

## 2019-09-01 NOTE — Patient Instructions (Addendum)
Please stop at the lab.  Please continue Synthroid 50 mcg daily.  Take the thyroid hormone every day, with water, at least 30 minutes before breakfast, separated by at least 4 hours from: - acid reflux medications - calcium - iron - multivitamins  Please continue Metformin ER 1000 mg daily.  Please come back for a follow-up appointment in 1 year.

## 2019-09-08 ENCOUNTER — Encounter: Payer: Self-pay | Admitting: Internal Medicine

## 2019-09-08 LAB — TESTOSTERONE, FREE AND TOTAL (INCLUDES SHBG)-(MALES)
% Free Testosterone: 1.2 %
Free Testosterone, S: 15 pg/mL — ABNORMAL HIGH
Sex Hormone Binding Globulin: 46.5 nmol/L
Testosterone, Serum (Total): 121 ng/dL — ABNORMAL HIGH

## 2019-09-08 MED ORDER — SYNTHROID 50 MCG PO TABS: ORAL_TABLET | ORAL | 3 refills | Status: DC

## 2019-09-08 MED ORDER — METFORMIN HCL ER 500 MG PO TB24: ORAL_TABLET | ORAL | 3 refills | Status: DC

## 2019-11-15 ENCOUNTER — Ambulatory Visit: Payer: Medicare Other

## 2019-11-16 ENCOUNTER — Other Ambulatory Visit: Payer: Self-pay | Admitting: Family Medicine

## 2019-11-16 DIAGNOSIS — R7303 Prediabetes: Secondary | ICD-10-CM

## 2019-11-16 DIAGNOSIS — E785 Hyperlipidemia, unspecified: Secondary | ICD-10-CM

## 2019-11-16 DIAGNOSIS — E039 Hypothyroidism, unspecified: Secondary | ICD-10-CM

## 2019-11-16 DIAGNOSIS — D751 Secondary polycythemia: Secondary | ICD-10-CM

## 2019-11-16 DIAGNOSIS — I10 Essential (primary) hypertension: Secondary | ICD-10-CM

## 2019-11-16 DIAGNOSIS — E559 Vitamin D deficiency, unspecified: Secondary | ICD-10-CM

## 2019-11-17 ENCOUNTER — Ambulatory Visit (INDEPENDENT_AMBULATORY_CARE_PROVIDER_SITE_OTHER): Payer: Medicare PPO

## 2019-11-17 ENCOUNTER — Other Ambulatory Visit (INDEPENDENT_AMBULATORY_CARE_PROVIDER_SITE_OTHER): Payer: Medicare PPO

## 2019-11-17 ENCOUNTER — Other Ambulatory Visit: Payer: Self-pay

## 2019-11-17 ENCOUNTER — Ambulatory Visit: Payer: Medicare Other

## 2019-11-17 VITALS — Wt 182.0 lb

## 2019-11-17 DIAGNOSIS — E559 Vitamin D deficiency, unspecified: Secondary | ICD-10-CM | POA: Diagnosis not present

## 2019-11-17 DIAGNOSIS — D751 Secondary polycythemia: Secondary | ICD-10-CM

## 2019-11-17 DIAGNOSIS — E785 Hyperlipidemia, unspecified: Secondary | ICD-10-CM

## 2019-11-17 DIAGNOSIS — R7303 Prediabetes: Secondary | ICD-10-CM

## 2019-11-17 DIAGNOSIS — I10 Essential (primary) hypertension: Secondary | ICD-10-CM | POA: Diagnosis not present

## 2019-11-17 DIAGNOSIS — Z0000 Encounter for general adult medical examination without abnormal findings: Secondary | ICD-10-CM

## 2019-11-17 LAB — COMPREHENSIVE METABOLIC PANEL
ALT: 40 U/L — ABNORMAL HIGH (ref 0–35)
AST: 27 U/L (ref 0–37)
Albumin: 4.4 g/dL (ref 3.5–5.2)
Alkaline Phosphatase: 59 U/L (ref 39–117)
BUN: 13 mg/dL (ref 6–23)
CO2: 30 mEq/L (ref 19–32)
Calcium: 9.8 mg/dL (ref 8.4–10.5)
Chloride: 103 mEq/L (ref 96–112)
Creatinine, Ser: 0.94 mg/dL (ref 0.40–1.20)
GFR: 58.93 mL/min — ABNORMAL LOW (ref >60.00)
Glucose, Bld: 112 mg/dL — ABNORMAL HIGH (ref 70–99)
Potassium: 5.1 mEq/L (ref 3.5–5.1)
Sodium: 138 mEq/L (ref 135–145)
Total Bilirubin: 0.5 mg/dL (ref 0.2–1.2)
Total Protein: 7.1 g/dL (ref 6.0–8.3)

## 2019-11-17 LAB — CBC WITH DIFFERENTIAL/PLATELET
Basophils Absolute: 0 10*3/uL (ref 0.0–0.1)
Basophils Relative: 1 % (ref 0.0–3.0)
Eosinophils Absolute: 0.2 10*3/uL (ref 0.0–0.7)
Eosinophils Relative: 4.8 % (ref 0.0–5.0)
HCT: 45.1 % (ref 36.0–46.0)
Hemoglobin: 14.8 g/dL (ref 12.0–15.0)
Lymphocytes Relative: 35.7 % (ref 12.0–46.0)
Lymphs Abs: 1.7 10*3/uL (ref 0.7–4.0)
MCHC: 32.7 g/dL (ref 30.0–36.0)
MCV: 79.4 fl (ref 78.0–100.0)
Monocytes Absolute: 0.4 10*3/uL (ref 0.1–1.0)
Monocytes Relative: 8.8 % (ref 3.0–12.0)
Neutro Abs: 2.4 10*3/uL (ref 1.4–7.7)
Neutrophils Relative %: 49.7 % (ref 43.0–77.0)
Platelets: 235 10*3/uL (ref 150.0–400.0)
RBC: 5.68 Mil/uL — ABNORMAL HIGH (ref 3.87–5.11)
RDW: 14.2 % (ref 11.5–15.5)
WBC: 4.9 10*3/uL (ref 4.0–10.5)

## 2019-11-17 LAB — LIPID PANEL
Cholesterol: 226 mg/dL — ABNORMAL HIGH (ref 0–200)
HDL: 58.5 mg/dL (ref >39.00)
LDL Cholesterol: 137 mg/dL — ABNORMAL HIGH (ref 0–99)
NonHDL: 167.82
Total CHOL/HDL Ratio: 4
Triglycerides: 154 mg/dL — ABNORMAL HIGH (ref 0.0–149.0)
VLDL: 30.8 mg/dL (ref 0.0–40.0)

## 2019-11-17 LAB — MICROALBUMIN / CREATININE URINE RATIO
Creatinine,U: 73.9 mg/dL
Microalb Creat Ratio: 1.2 mg/g (ref 0.0–30.0)
Microalb, Ur: 0.9 mg/dL (ref 0.0–1.9)

## 2019-11-17 LAB — VITAMIN D 25 HYDROXY (VIT D DEFICIENCY, FRACTURES): VITD: 65.77 ng/mL (ref 30.00–100.00)

## 2019-11-17 LAB — HEMOGLOBIN A1C: Hgb A1c MFr Bld: 5.9 % (ref 4.6–6.5)

## 2019-11-17 NOTE — Progress Notes (Signed)
Subjective:   Cynthia Roberson is a 69 y.o. female who presents for Medicare Annual (Subsequent) preventive examination.  Review of Systems: N/A    This visit is being conducted through telemedicine via telephone at the nurse health advisor's home address due to the COVID-19 pandemic. This patient has given me verbal consent via doximity to conduct this visit, patient states they are participating from their home address. Patient and myself are on the telephone call. There is no referral for this visit. Some vital signs may be absent or patient reported.    Patient identification: identified by name, DOB, and current address   Cardiac Risk Factors include: advanced age (>62men, >64 women);dyslipidemia;hypertension     Objective:     Vitals: Wt 182 lb (82.6 kg)   BMI 33.56 kg/m   Body mass index is 33.56 kg/m.  Advanced Directives 11/17/2019 11/12/2018 11/07/2017 11/06/2016  Does Patient Have a Medical Advance Directive? No No No No  Would patient like information on creating a medical advance directive? Yes (MAU/Ambulatory/Procedural Areas - Information given) No - Patient declined Yes (MAU/Ambulatory/Procedural Areas - Information given) -    Tobacco Social History   Tobacco Use  Smoking Status Never Smoker  Smokeless Tobacco Never Used     Counseling given: Not Answered   Clinical Intake:  Pre-visit preparation completed: Yes  Pain : No/denies pain     Nutritional Risks: None Diabetes: No  How often do you need to have someone help you when you read instructions, pamphlets, or other written materials from your doctor or pharmacy?: 1 - Never What is the last grade level you completed in school?: college  Interpreter Needed?: No  Information entered by :: CJohnson, LPN  Past Medical History:  Diagnosis Date  . Clostridium difficile infection 2013  . Hirsutism   . Hyperandrogenemia   . Hypertension   . Osteopenia    DEXA 2016  . Other alopecia   .  Polycystic ovaries   . Unspecified hypothyroidism    Past Surgical History:  Procedure Laterality Date  . COLONOSCOPY  2015   h/o polyps, rpt 5 yrs ([facility])  . COLONOSCOPY  2011   adenomatous polyp, rpt 5 yrs ([facility])  . TONSILLECTOMY    . WISDOM TOOTH EXTRACTION     Family History  Problem Relation Age of Onset  . Heart failure Mother   . Cancer Mother 45       lung  . Heart disease Father   . Stroke Father   . Hypertension Father    Social History   Socioeconomic History  . Marital status: Married    Spouse name: [name]  . Number of children: Not on file  . Years of education: Not on file  . Highest education level: Not on file  Occupational History  . Not on file  Tobacco Use  . Smoking status: Never Smoker  . Smokeless tobacco: Never Used  Substance and Sexual Activity  . Alcohol use: Yes    Comment: occassionally  . Drug use: No  . Sexual activity: Yes  Other Topics Concern  . Not on file  Social History Narrative   Lives with husband [name]   Occ: retired, was middle Research scientist (physical sciences)    Activity: stays active caring for grandchildren   Diet: good water, fruits/vegetables daily    Social Determinants of Health   Financial Resource Strain: Low Risk   . Difficulty of Paying Living Expenses: Not hard at all  Food Insecurity: No Food Insecurity  .  Worried About Charity fundraiser in the Last Year: Never true  . Ran Out of Food in the Last Year: Never true  Transportation Needs: No Transportation Needs  . Lack of Transportation (Medical): No  . Lack of Transportation (Non-Medical): No  Physical Activity: Inactive  . Days of Exercise per Week: 0 days  . Minutes of Exercise per Session: 0 min  Stress: No Stress Concern Present  . Feeling of Stress : Not at all  Social Connections:   . Frequency of Communication with Friends and Family: Not on file  . Frequency of Social Gatherings with Friends and Family: Not on file  . Attends Religious Services: Not on  file  . Active Member of Clubs or Organizations: Not on file  . Attends Archivist Meetings: Not on file  . Marital Status: Not on file    Outpatient Encounter Medications as of 11/17/2019  Medication Sig  . Biotin 5 MG TABS Take 5 mg by mouth daily.   . Calcium Carb-Cholecalciferol (CALTRATE 600+D3 SOFT PO) Take by mouth.  . Calcium Carbonate 500 MG CHEW Chew 2 tablets by mouth daily.   . Cholecalciferol (VITAMIN D) 2000 UNITS tablet Take 4,000 Units by mouth daily.   . cycloSPORINE (RESTASIS OP) Apply 1 drop to eye 2 (two) times daily.  [name] doxycycline (VIBRA-TABS) 100 MG tablet Take 1 tablet (100 mg total) by mouth 2 (two) times daily.  . fluticasone (FLONASE) 50 MCG/ACT nasal spray Place 2 sprays into both nostrils daily.  [name] glucose blood (ONETOUCH VERIO) test strip Use to test sugars two times weekly  . loratadine (CLARITIN) 10 MG tablet Take 10 mg by mouth daily as needed.   . metFORMIN (GLUCOPHAGE-XR) 500 MG 24 hr tablet Please take 1 tab 2x a day  . Minoxidil (ROGAINE MENS) 5 % FOAM Apply topically.  . nebivolol (BYSTOLIC) 2.5 MG tablet TAKE 1 TABLET BY MOUTH DAILY  . NONFORMULARY OR COMPOUNDED ITEM 2 capsules 2 (two) times daily. Hydro-eye  . ONETOUCH DELICA LANCETS 99991111 MISC Use to test sugars 2 times weekly  . SYNTHROID 50 MCG tablet TAKE 1 TABLET BY MOUTH EVERY DAY BEFORE BREAKFAST  . Turmeric Curcumin 500 MG CAPS Take 1 capsule by mouth daily.  . vitamin B-12 (CYANOCOBALAMIN) 500 MCG tablet Take 500 mcg by mouth daily.  . vitamin E 400 UNIT capsule Take 400 Units by mouth daily.  . Zinc 50 MG TABS Take 1 tablet by mouth daily.   No facility-administered encounter medications on file as of 11/17/2019.    Activities of Daily Living In your present state of health, do you have any difficulty performing the following activities: 11/17/2019  Hearing? N  Vision? N  Difficulty concentrating or making decisions? N  Walking or climbing stairs? N  Dressing or bathing? N    Doing errands, shopping? N  Preparing Food and eating ? N  Using the Toilet? N  In the past six months, have you accidently leaked urine? N  Do you have problems with loss of bowel control? N  Managing your Medications? N  Managing your Finances? N  Housekeeping or managing your Housekeeping? N  Some recent data might be hidden    Patient Care Team: Ria Bush, MD as PCP - General (Family Medicine) Philemon Kingdom, MD as Consulting Physician (Internal Medicine) Rolm Bookbinder, MD as Consulting Physician (Dermatology) Rolley Sims, [place] as Consulting Physician (Optometry) Arta Silence, MD as Consulting Physician (Gastroenterology) Brien Few, MD as Consulting Physician (Obstetrics and  Gynecology) Nyra Capes, DDS as Consulting Physician (Dentistry) Harrie Foreman, MD as Consulting Physician (Neurology)    Assessment:   This is a routine wellness examination for [place].  Exercise Activities and Dietary recommendations Current Exercise Habits: The patient does not participate in regular exercise at present, Exercise limited by: None identified  Goals    . Follow up with Primary Care Provider     Starting 11/12/2018, I will continue to take medications as prescribed and to keep appointments with PCP as scheduled.     . Patient Stated     11/17/2019, I will try to start eating a much healthier diet.       Fall Risk Fall Risk  11/17/2019 11/12/2018 11/07/2017 11/06/2016  Falls in the past year? 0 0 No No  Number falls in past yr: 0 - - -  Injury with Fall? 0 - - -  Risk for fall due to : Medication side effect - - -  Follow up Falls evaluation completed;Falls prevention discussed - - -   Is the patient's home free of loose throw rugs in walkways, pet beds, electrical cords, etc?   yes      Grab bars in the bathroom? no      Handrails on the stairs?   yes      Adequate lighting?   yes  Timed Get Up and Go performed: N/A  Depression Screen PHQ 2/9 Scores  11/17/2019 11/12/2018 11/07/2017 11/06/2016  PHQ - 2 Score 0 0 0 0  PHQ- 9 Score 0 0 0 -     Cognitive Function MMSE - Mini Mental State Exam 11/17/2019 11/12/2018 11/07/2017 11/06/2016  Orientation to time 5 5 5 5   Orientation to Place 5 5 5 5   Registration 3 3 3 3   Attention/ Calculation 5 0 0 0  Recall 3 3 3 3   Language- name 2 objects - 0 0 0  Language- repeat 1 1 1 1   Language- follow 3 step command - 3 3 3   Language- read & follow direction - 0 0 0  Write a sentence - 0 0 0  Copy design - 0 0 0  Total score - 20 20 20   Mini Cog  Mini-Cog screen was completed. Maximum score is 22. A value of 0 denotes this part of the MMSE was not completed or the patient failed this part of the Mini-Cog screening.       Immunization History  Administered Date(s) Administered  . Fluad Quad(high Dose 65+) 09/01/2019  . Influenza Split 07/11/2014  . Influenza, High Dose Seasonal PF 07/24/2017, 07/24/2018  . Influenza,inj,Quad PF,6+ Mos 07/28/2015, 07/05/2016  . Influenza-Unspecified 07/24/2017  . Pneumococcal Conjugate-13 08/15/2015  . Pneumococcal Polysaccharide-23 11/11/2016  . Tdap 04/11/2011  . Zoster 01/13/2014  . Zoster Recombinat (Shingrix) 12/09/2017, 11/02/2018    Qualifies for Shingles Vaccine? Completed series   Screening Tests Health Maintenance  Topic Date Due  . DTAP VACCINES (1) 05/15/1951  . MAMMOGRAM  11/29/2019  . DTaP/Tdap/Td (2 - Td) 04/10/2021  . TETANUS/TDAP  04/10/2021  . COLONOSCOPY  12/26/2021  . INFLUENZA VACCINE  Completed  . DEXA SCAN  Completed  . Hepatitis C Screening  Completed  . PNA vac Low Risk Adult  Completed    Cancer Screenings: Lung: Low Dose CT Chest recommended if Age 20-80 years, 30 pack-year currently smoking OR have quit w/in 15 years. Patient does not qualify. Breast:  Up to date on Mammogram? Yes, completed 11/28/2017   Up to date of Bone  Density/Dexa? Yes, completed 07/27/2013 Colorectal: completed 05/23/2015  Additional Screenings:    Hepatitis C Screening: 10/29/1991     Plan:   Patient will start eating a healthier diet.   I have personally reviewed and noted the following in the patient's chart:   . Medical and social history . Use of alcohol, tobacco or illicit drugs  . Current medications and supplements . Functional ability and status . Nutritional status . Physical activity . Advanced directives . List of other physicians . Hospitalizations, surgeries, and ER visits in previous 12 months . Vitals . Screenings to include cognitive, depression, and falls . Referrals and appointments  In addition, I have reviewed and discussed with patient certain preventive protocols, quality metrics, and best practice recommendations. A written personalized care plan for preventive services as well as general preventive health recommendations were provided to patient.     Andrez Grime, LPN  [id number]

## 2019-11-17 NOTE — Progress Notes (Signed)
PCP notes:  Health Maintenance: No gaps noted   Abnormal Screenings: none   Patient concerns: none   Nurse concerns: none   Next PCP appt.: 11/22/2019 @ 10:30 am

## 2019-11-17 NOTE — Patient Instructions (Addendum)
[name] , Thank you for taking time to come for your Medicare Wellness Visit. I appreciate your ongoing commitment to your health goals. Please review the following plan we discussed and let me know if I can assist you in the future.   Screening recommendations/referrals: Colonoscopy: Up to date, completed 05/23/2015 Mammogram: Up to date, completed 11/28/2017 Bone Density: Up to date, completed 07/27/2013 Recommended yearly ophthalmology/optometry visit for glaucoma screening and checkup Recommended yearly dental visit for hygiene and checkup.  Vaccinations: Influenza vaccine: Up to date, completed 09/01/2019 Pneumococcal vaccine: Completed series Tdap vaccine: Up to date, completed 07/08/2011 Shingles vaccine: Completed series    Advanced directives: Advance directive discussed with you today. I have provided a copy for you to complete at home and have notarized. Once this is complete please bring a copy in to our office so we can scan it into your chart.  Conditions/risks identified: hypertension, hyperlipidemia  Next appointment: 11/22/2019 @ 10:30 am    Preventive Care 65 Years and Older, Female Preventive care refers to lifestyle choices and visits with your health care provider that can promote health and wellness. What does preventive care include?  A yearly physical exam. This is also called an annual well check.  Dental exams once or twice a year.  Routine eye exams. Ask your health care provider how often you should have your eyes checked.  Personal lifestyle choices, including:  Daily care of your teeth and gums.  Regular physical activity.  Eating a healthy diet.  Avoiding tobacco and drug use.  Limiting alcohol use.  Practicing safe sex.  Taking low-dose aspirin every day.  Taking vitamin and mineral supplements as recommended by your health care provider. What happens during an annual well check? The services and screenings done by your health care provider  during your annual well check will depend on your age, overall health, lifestyle risk factors, and family history of disease. Counseling  Your health care provider may ask you questions about your:  Alcohol use.  Tobacco use.  Drug use.  Emotional well-being.  Home and relationship well-being.  Sexual activity.  Eating habits.  History of falls.  Memory and ability to understand (cognition).  Work and work Statistician.  Reproductive health. Screening  You may have the following tests or measurements:  Height, weight, and BMI.  Blood pressure.  Lipid and cholesterol levels. These may be checked every 5 years, or more frequently if you are over 54 years old.  Skin check.  Lung cancer screening. You may have this screening every year starting at age 58 if you have a 30-pack-year history of smoking and currently smoke or have quit within the past 15 years.  Fecal occult blood test (FOBT) of the stool. You may have this test every year starting at age 60.  Flexible sigmoidoscopy or colonoscopy. You may have a sigmoidoscopy every 5 years or a colonoscopy every 10 years starting at age 56.  Hepatitis C blood test.  Hepatitis B blood test.  Sexually transmitted disease (STD) testing.  Diabetes screening. This is done by checking your blood sugar (glucose) after you have not eaten for a while (fasting). You may have this done every 1-3 years.  Bone density scan. This is done to screen for osteoporosis. You may have this done starting at age 25.  Mammogram. This may be done every 1-2 years. Talk to your health care provider about how often you should have regular mammograms. Talk with your health care provider about your test  results, treatment options, and if necessary, the need for more tests. Vaccines  Your health care provider may recommend certain vaccines, such as:  Influenza vaccine. This is recommended every year.  Tetanus, diphtheria, and acellular pertussis  (Tdap, Td) vaccine. You may need a Td booster every 10 years.  Zoster vaccine. You may need this after age 58.  Pneumococcal 13-valent conjugate (PCV13) vaccine. One dose is recommended after age 57.  Pneumococcal polysaccharide (PPSV23) vaccine. One dose is recommended after age 45. Talk to your health care provider about which screenings and vaccines you need and how often you need them. This information is not intended to replace advice given to you by your health care provider. Make sure you discuss any questions you have with your health care provider. Document Released: 11/10/2015 Document Revised: 07/03/2016 Document Reviewed: 08/15/2015 Elsevier Interactive Patient Education  2017 [place] Prevention in the Home Falls can cause injuries. They can happen to people of all ages. There are many things you can do to make your home safe and to help prevent falls. What can I do on the outside of my home?  Regularly fix the edges of walkways and driveways and fix any cracks.  Remove anything that might make you trip as you walk through a door, such as a raised step or threshold.  Trim any bushes or trees on the path to your home.  Use bright outdoor lighting.  Clear any walking paths of anything that might make someone trip, such as rocks or tools.  Regularly check to see if handrails are loose or broken. Make sure that both sides of any steps have handrails.  Any raised decks and porches should have guardrails on the edges.  Have any leaves, snow, or ice cleared regularly.  Use sand or salt on walking paths during winter.  Clean up any spills in your garage right away. This includes oil or grease spills. What can I do in the bathroom?  Use night lights.  Install grab bars by the toilet and in the tub and shower. Do not use towel bars as grab bars.  Use non-skid mats or decals in the tub or shower.  If you need to sit down in the shower, use a plastic, non-slip  stool.  Keep the floor dry. Clean up any water that spills on the floor as soon as it happens.  Remove soap buildup in the tub or shower regularly.  Attach bath mats securely with double-sided non-slip rug tape.  Do not have throw rugs and other things on the floor that can make you trip. What can I do in the bedroom?  Use night lights.  Make sure that you have a light by your bed that is easy to reach.  Do not use any sheets or blankets that are too big for your bed. They should not hang down onto the floor.  Have a firm chair that has side arms. You can use this for support while you get dressed.  Do not have throw rugs and other things on the floor that can make you trip. What can I do in the kitchen?  Clean up any spills right away.  Avoid walking on wet floors.  Keep items that you use a lot in easy-to-reach places.  If you need to reach something above you, use a strong step stool that has a grab bar.  Keep electrical cords out of the way.  Do not use floor polish or wax that makes  floors slippery. If you must use wax, use non-skid floor wax.  Do not have throw rugs and other things on the floor that can make you trip. What can I do with my stairs?  Do not leave any items on the stairs.  Make sure that there are handrails on both sides of the stairs and use them. Fix handrails that are broken or loose. Make sure that handrails are as long as the stairways.  Check any carpeting to make sure that it is firmly attached to the stairs. Fix any carpet that is loose or worn.  Avoid having throw rugs at the top or bottom of the stairs. If you do have throw rugs, attach them to the floor with carpet tape.  Make sure that you have a light switch at the top of the stairs and the bottom of the stairs. If you do not have them, ask someone to add them for you. What else can I do to help prevent falls?  Wear shoes that:  Do not have high heels.  Have rubber bottoms.  Are  comfortable and fit you well.  Are closed at the toe. Do not wear sandals.  If you use a stepladder:  Make sure that it is fully opened. Do not climb a closed stepladder.  Make sure that both sides of the stepladder are locked into place.  Ask someone to hold it for you, if possible.  Clearly mark and make sure that you can see:  Any grab bars or handrails.  First and last steps.  Where the edge of each step is.  Use tools that help you move around (mobility aids) if they are needed. These include:  Canes.  Walkers.  Scooters.  Crutches.  Turn on the lights when you go into a dark area. Replace any light bulbs as soon as they burn out.  Set up your furniture so you have a clear path. Avoid moving your furniture around.  If any of your floors are uneven, fix them.  If there are any pets around you, be aware of where they are.  Review your medicines with your doctor. Some medicines can make you feel dizzy. This can increase your chance of falling. Ask your doctor what other things that you can do to help prevent falls. This information is not intended to replace advice given to you by your health care provider. Make sure you discuss any questions you have with your health care provider. Document Released: 08/10/2009 Document Revised: 03/21/2016 Document Reviewed: 11/18/2014 Elsevier Interactive Patient Education  2017 [employer].

## 2019-11-22 ENCOUNTER — Other Ambulatory Visit: Payer: Self-pay

## 2019-11-22 ENCOUNTER — Encounter: Payer: Self-pay | Admitting: Family Medicine

## 2019-11-22 ENCOUNTER — Ambulatory Visit (INDEPENDENT_AMBULATORY_CARE_PROVIDER_SITE_OTHER): Payer: Medicare PPO | Admitting: Family Medicine

## 2019-11-22 VITALS — BP 128/80 | HR 72 | Temp 97.6°F | Ht 61.5 in | Wt 181.2 lb

## 2019-11-22 DIAGNOSIS — E039 Hypothyroidism, unspecified: Secondary | ICD-10-CM

## 2019-11-22 DIAGNOSIS — Z0000 Encounter for general adult medical examination without abnormal findings: Secondary | ICD-10-CM | POA: Diagnosis not present

## 2019-11-22 DIAGNOSIS — M8580 Other specified disorders of bone density and structure, unspecified site: Secondary | ICD-10-CM

## 2019-11-22 DIAGNOSIS — Z7189 Other specified counseling: Secondary | ICD-10-CM

## 2019-11-22 DIAGNOSIS — M25571 Pain in right ankle and joints of right foot: Secondary | ICD-10-CM

## 2019-11-22 DIAGNOSIS — G8929 Other chronic pain: Secondary | ICD-10-CM

## 2019-11-22 DIAGNOSIS — R7303 Prediabetes: Secondary | ICD-10-CM

## 2019-11-22 DIAGNOSIS — I10 Essential (primary) hypertension: Secondary | ICD-10-CM | POA: Diagnosis not present

## 2019-11-22 DIAGNOSIS — E282 Polycystic ovarian syndrome: Secondary | ICD-10-CM | POA: Diagnosis not present

## 2019-11-22 DIAGNOSIS — D751 Secondary polycythemia: Secondary | ICD-10-CM

## 2019-11-22 DIAGNOSIS — E281 Androgen excess: Secondary | ICD-10-CM | POA: Diagnosis not present

## 2019-11-22 DIAGNOSIS — E785 Hyperlipidemia, unspecified: Secondary | ICD-10-CM

## 2019-11-22 DIAGNOSIS — E559 Vitamin D deficiency, unspecified: Secondary | ICD-10-CM

## 2019-11-22 DIAGNOSIS — E669 Obesity, unspecified: Secondary | ICD-10-CM

## 2019-11-22 NOTE — Assessment & Plan Note (Signed)
Preventative protocols reviewed and updated unless pt declined. Discussed healthy diet and lifestyle.  

## 2019-11-22 NOTE — Assessment & Plan Note (Signed)
Chronic, stable. Followed by endo.

## 2019-11-22 NOTE — Progress Notes (Signed)
This visit was conducted in person.  BP 128/80 (BP Location: Left Arm, Patient Position: Sitting, Cuff Size: Normal)   Pulse 72   Temp 97.6 F (36.4 C) (Temporal)   Ht 5' 1.5" (1.562 m)   Wt 181 lb 3 oz (82.2 kg)   SpO2 97%   BMI 33.68 kg/m    CC: CPE Subjective:    Patient ID: [name], female    DOB: [birth year], 69 y.o.   MRN: [id number]  HPI: Roselma Ronning is a 69 y.o. female presenting on 11/22/2019 for Annual Exam (Prt 2. )   Saw health advisor last week for medicare wellness visit. Note reviewed.   No exam data present    Clinical Support from 11/17/2019 in [place] [facility] Total Score  0      Fall Risk  11/17/2019 11/12/2018 11/07/2017 11/06/2016  Falls in the past year? 0 0 No No  Number falls in past yr: 0 - - -  Injury with Fall? 0 - - -  Risk for fall due to : Medication side effect - - -  Follow up Falls evaluation completed;Falls prevention discussed - - -    Sees Dr Cruzita Lederer for PCOS and hypothyroidism. Has spoken with endo about saw palmetto for hair loss. Interested in trying this.   Ongoing intermittent R lateral ankle pain since closed nondisplaced fracture of R lateral malleolus 2019.   Preventative: COLONOSCOPY 2015 h/o polyps, rpt 5 yrs (Outlaw)- will check with GI office about when f/u is due.  Well woman - sees Dr Fannie Knee- aged out of cervical cancer screening - due Breast cancer screening -at [facility] office - due DEXA - h/o osteopenia, unsure date - followed by OBGYN. due Lung cancer screening -not eligible  Flu shot -yearly  Tdap 2012 prevnar 2016, pneumovax2018  zostavax-2015 shingrix - completed 2 shot series 2020 Advanced directive discussion -received advanced directive packet last year - working on this.Husband would be HCPOA then daughter [name]  Seat belt use discussed  Sunscreen use discussed, no changing moles on skin - sees derm yearly(Laura Lomax)  Nonsmoker  Alcohol - rare Dentist  q6 mo  Eye exam q6 mo [name])  Bowel - no constipation  Bladder - no incontinence   Lives with husband [name]  Occ: retired, was middle Research scientist (physical sciences) Activity: stays active caring for grandchildren - hasn't used treadmill recently Diet: good water, fruits/vegetables daily     Relevant past medical, surgical, family and social history reviewed and updated as indicated. Interim medical history since our last visit reviewed. Allergies and medications reviewed and updated. Outpatient Medications Prior to Visit  Medication Sig Dispense Refill  . Biotin 5 MG TABS Take 5 mg by mouth daily.     . Cholecalciferol (VITAMIN D) 2000 UNITS tablet Take 4,000 Units by mouth daily.     . cycloSPORINE (RESTASIS) 0.05 % ophthalmic emulsion Place 1 drop into both eyes 2 (two) times daily.    . Famotidine (PEPCID PO) Take by mouth as needed. OTC    . fluticasone (FLONASE) 50 MCG/ACT nasal spray Place 2 sprays into both nostrils daily.    [name] glucose blood (ONETOUCH VERIO) test strip Use to test sugars two times weekly 30 each 12  . Lactase (LACTAID PO) Take by mouth as needed.    . loratadine (CLARITIN) 10 MG tablet Take 10 mg by mouth daily as needed.     . metFORMIN (GLUCOPHAGE-XR) 500 MG 24 hr tablet Please take 1 tab  2x a day 180 tablet 3  . Minoxidil (ROGAINE MENS) 5 % FOAM Apply topically.    . nebivolol (BYSTOLIC) 2.5 MG tablet TAKE 1 TABLET BY MOUTH DAILY 90 tablet 3  . NONFORMULARY OR COMPOUNDED ITEM 2 capsules 2 (two) times daily. Hydro-eye    . ONETOUCH DELICA LANCETS 99991111 MISC Use to test sugars 2 times weekly 30 each 11  . SYNTHROID 50 MCG tablet TAKE 1 TABLET BY MOUTH EVERY DAY BEFORE BREAKFAST 90 tablet 3  . Turmeric Curcumin 500 MG CAPS Take 1 capsule by mouth daily.    . vitamin B-12 (CYANOCOBALAMIN) 500 MCG tablet Take 500 mcg by mouth daily.    . vitamin E 400 UNIT capsule Take 400 Units by mouth daily.    . Zinc 50 MG TABS Take 1 tablet by mouth daily.    . Calcium  Carb-Cholecalciferol (CALTRATE 600+D3 SOFT PO) Take by mouth.    . Calcium Carbonate 500 MG CHEW Chew 2 tablets by mouth daily.     . cycloSPORINE (RESTASIS OP) Apply 1 drop to eye 2 (two) times daily.    [name] doxycycline (VIBRA-TABS) 100 MG tablet Take 1 tablet (100 mg total) by mouth 2 (two) times daily. 20 tablet 0   No facility-administered medications prior to visit.     Per HPI unless specifically indicated in ROS section below Review of Systems  Constitutional: Negative for activity change, appetite change, chills, fatigue, fever and unexpected weight change.  HENT: Negative for hearing loss.   Eyes: Negative for visual disturbance.  Respiratory: Negative for cough, chest tightness, shortness of breath and wheezing.   Cardiovascular: Negative for chest pain, palpitations and leg swelling.  Gastrointestinal: Positive for diarrhea (diet related). Negative for abdominal distention, abdominal pain, blood in stool, constipation, nausea and vomiting.  Genitourinary: Negative for difficulty urinating and hematuria.  Musculoskeletal: Negative for arthralgias, myalgias and neck pain.  Skin: Negative for rash.  Neurological: Negative for dizziness, seizures, syncope and headaches.  Hematological: Negative for adenopathy. Does not bruise/bleed easily.  Psychiatric/Behavioral: Negative for dysphoric mood. The patient is not nervous/anxious.    Objective:    BP 128/80 (BP Location: Left Arm, Patient Position: Sitting, Cuff Size: Normal)   Pulse 72   Temp 97.6 F (36.4 C) (Temporal)   Ht 5' 1.5" (1.562 m)   Wt 181 lb 3 oz (82.2 kg)   SpO2 97%   BMI 33.68 kg/m   Wt Readings from Last 3 Encounters:  11/22/19 181 lb 3 oz (82.2 kg)  11/17/19 182 lb (82.6 kg)  09/01/19 182 lb (82.6 kg)    Physical Exam Vitals and nursing note reviewed.  Constitutional:      General: She is not in acute distress.    Appearance: Normal appearance. She is well-developed. She is not ill-appearing.  HENT:      Head: Normocephalic and atraumatic.     Right Ear: Hearing, tympanic membrane, ear canal and external ear normal.     Left Ear: Hearing, tympanic membrane, ear canal and external ear normal.     Mouth/Throat:     Pharynx: Uvula midline.  Eyes:     General: No scleral icterus.    Extraocular Movements: Extraocular movements intact.     Conjunctiva/sclera: Conjunctivae normal.     Pupils: Pupils are equal, round, and reactive to light.  Cardiovascular:     Rate and Rhythm: Normal rate and regular rhythm.     Pulses: Normal pulses.  Radial pulses are 2+ on the right side and 2+ on the left side.     Heart sounds: Normal heart sounds. No murmur.  Pulmonary:     Effort: Pulmonary effort is normal. No respiratory distress.     Breath sounds: Normal breath sounds. No wheezing, rhonchi or rales.  Abdominal:     General: Abdomen is flat. Bowel sounds are normal. There is no distension.     Palpations: Abdomen is soft. There is no mass.     Tenderness: There is no abdominal tenderness. There is no guarding or rebound.     Hernia: No hernia is present.  Musculoskeletal:        General: Normal range of motion.     Cervical back: Normal range of motion and neck supple.     Right lower leg: No edema.     Left lower leg: No edema.  Lymphadenopathy:     Cervical: No cervical adenopathy.  Skin:    General: Skin is warm and dry.     Findings: No rash.  Neurological:     General: No focal deficit present.     Mental Status: She is alert and oriented to person, place, and time.     Comments: CN grossly intact, station and gait intact  Psychiatric:        Mood and Affect: Mood normal.        Behavior: Behavior normal.        Thought Content: Thought content normal.        Judgment: Judgment normal.       Results for orders placed or performed in visit on 11/17/19  VITAMIN D 25 Hydroxy (Vit-D Deficiency, Fractures)  Result Value Ref Range   VITD 65.77 30.00 - 100.00 ng/mL   Microalbumin / creatinine urine ratio  Result Value Ref Range   Microalb, Ur 0.9 0.0 - 1.9 mg/dL   Creatinine,U 73.9 mg/dL   Microalb Creat Ratio 1.2 0.0 - 30.0 mg/g  CBC with Differential/Platelet  Result Value Ref Range   WBC 4.9 4.0 - 10.5 K/uL   RBC 5.68 (H) 3.87 - 5.11 Mil/uL   Hemoglobin 14.8 12.0 - 15.0 g/dL   HCT 45.1 36.0 - 46.0 %   MCV 79.4 78.0 - 100.0 fl   MCHC 32.7 30.0 - 36.0 g/dL   RDW 14.2 11.5 - 15.5 %   Platelets 235.0 150.0 - 400.0 K/uL   Neutrophils Relative % 49.7 43.0 - 77.0 %   Lymphocytes Relative 35.7 12.0 - 46.0 %   Monocytes Relative 8.8 3.0 - 12.0 %   Eosinophils Relative 4.8 0.0 - 5.0 %   Basophils Relative 1.0 0.0 - 3.0 %   Neutro Abs 2.4 1.4 - 7.7 K/uL   Lymphs Abs 1.7 0.7 - 4.0 K/uL   Monocytes Absolute 0.4 0.1 - 1.0 K/uL   Eosinophils Absolute 0.2 0.0 - 0.7 K/uL   Basophils Absolute 0.0 0.0 - 0.1 K/uL  Hemoglobin A1c  Result Value Ref Range   Hgb A1c MFr Bld 5.9 4.6 - 6.5 %  Comprehensive metabolic panel  Result Value Ref Range   Sodium 138 135 - 145 mEq/L   Potassium 5.1 Hemolysis seen 3.5 - 5.1 mEq/L   Chloride 103 96 - 112 mEq/L   CO2 30 19 - 32 mEq/L   Glucose, Bld 112 (H) 70 - 99 mg/dL   BUN 13 6 - 23 mg/dL   Creatinine, Ser 0.94 0.40 - 1.20 mg/dL   Total Bilirubin 0.5 0.2 -  1.2 mg/dL   Alkaline Phosphatase 59 39 - 117 U/L   AST 27 0 - 37 U/L   ALT 40 (H) 0 - 35 U/L   Total Protein 7.1 6.0 - 8.3 g/dL   Albumin 4.4 3.5 - 5.2 g/dL   GFR 58.93 (L) >60.00 mL/min   Calcium 9.8 8.4 - 10.5 mg/dL  Lipid panel  Result Value Ref Range   Cholesterol 226 (H) 0 - 200 mg/dL   Triglycerides 154.0 (H) 0.0 - 149.0 mg/dL   HDL 58.50 >39.00 mg/dL   VLDL 30.8 0.0 - 40.0 mg/dL   LDL Cholesterol 137 (H) 0 - 99 mg/dL   Total CHOL/HDL Ratio 4    NonHDL 167.82    Lab Results  Component Value Date   TSH 2.27 09/01/2019    Assessment & Plan:  This visit occurred during the SARS-CoV-2 public health emergency.  Safety protocols were in place,  including screening questions prior to the visit, additional usage of staff PPE, and extensive cleaning of exam room while observing appropriate contact time as indicated for disinfecting solutions.   Problem List Items Addressed This Visit    Vitamin D deficiency    Continue 4000 IU daily.       Routine general medical examination at a health care facility - Primary    Preventative protocols reviewed and updated unless pt declined. Discussed healthy diet and lifestyle.       Right ankle pain    Presumed arthritis related - she had previous ankle fracture - discussed voltaren gel PRN.       Prediabetes    Mild, stable.       Polycythemia, secondary    Due to elevated testosterone. Recent levels stable.       PCOS (polycystic ovarian syndrome)    Followed by endo Cruzita Lederer) - appreciate her care.       Osteopenia    Overdue for dexa - postponed due to pandemic.       Obesity (BMI 30.0-34.9)    Encouraged healthy diet and lifestyle changes to affect sustainable weight loss.       Hypothyroidism    Chronic, stable. Followed by endo.       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 Continue low dose bystolic.      Dyslipidemia    Chronic, cholesterol remains high off cholesterol medication - unable to tolerate statins. Consider trial of zetia vs RYR.  The 10-year ASCVD risk score Mikey Bussing DC Brooke Bonito., et al., 2013) is: 11.7%   Values used to calculate the score:     Age: 46 years     Sex: Female     Is Non-Hispanic African American: No     Diabetic: No     Tobacco smoker: No     Systolic Blood Pressure: 0000000 mmHg     Is BP treated: Yes     HDL Cholesterol: 58.5 mg/dL     Total Cholesterol: 226 mg/dL       Advanced care planning/counseling discussion    Advanced directive discussion -received advanced directive packet last year - working on this.Husband would  be HCPOA then daughter [name]           No orders of the defined types were placed in this encounter.  No orders of the defined types were placed in this encounter.   Patient instructions: We will  request latest colonoscopy records from Dr Paulita Fujita Sadie Haber) We will request records of latest mammogram (from Dr Kennith Maes office) Touch base with Dr Erlinda Hong office about colonoscopy.  Schedule well woman with Dr Ronita Hipps.  Good to see you today. Return as needed or in 1 year for next wellness visit.   Follow up plan: Return in about 1 year (around 11/21/2020) for annual exam, prior fasting for blood work.  Ria Bush, MD

## 2019-11-22 NOTE — Assessment & Plan Note (Signed)
Continue 4000 IU daily.

## 2019-11-22 NOTE — Assessment & Plan Note (Signed)
Advanced directive discussion -received advanced directive packet last year - working on this.Husband would be HCPOA then daughter [name]

## 2019-11-22 NOTE — Assessment & Plan Note (Signed)
Overdue for dexa - postponed due to pandemic.

## 2019-11-22 NOTE — Assessment & Plan Note (Signed)
Chronic, cholesterol remains high off cholesterol medication - unable to tolerate statins. Consider trial of zetia vs RYR.  The 10-year ASCVD risk score Cynthia Roberson., et al., 2013) is: 11.7%   Values used to calculate the score:     Age: 69 years     Sex: Female     Is Non-Hispanic African American: No     Diabetic: No     Tobacco smoker: No     Systolic Blood Pressure: 0000000 mmHg     Is BP treated: Yes     HDL Cholesterol: 58.5 mg/dL     Total Cholesterol: 226 mg/dL

## 2019-11-22 NOTE — Assessment & Plan Note (Signed)
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

## 2019-11-22 NOTE — Patient Instructions (Addendum)
We will request latest colonoscopy records from Dr Paulita Fujita [facility]) We will request records of latest mammogram (from Dr Kennith Maes office) Touch base with Dr Erlinda Hong office about colonoscopy.  Schedule well woman with Dr Ronita Hipps.  Good to see you today. Return as needed or in 1 year for next wellness visit.   Health Maintenance After Age 70 After age 60, you are at a higher risk for certain long-term diseases and infections as well as injuries from falls. Falls are a major cause of broken bones and head injuries in people who are older than age 76. Getting regular preventive care can help to keep you healthy and well. Preventive care includes getting regular testing and making lifestyle changes as recommended by your health care provider. Talk with your health care provider about:  Which screenings and tests you should have. A screening is a test that checks for a disease when you have no symptoms.  A diet and exercise plan that is right for you. What should I know about screenings and tests to prevent falls? Screening and testing are the best ways to find a health problem early. Early diagnosis and treatment give you the best chance of managing medical conditions that are common after age 49. Certain conditions and lifestyle choices may make you more likely to have a fall. Your health care provider may recommend:  Regular vision checks. Poor vision and conditions such as cataracts can make you more likely to have a fall. If you wear glasses, make sure to get your prescription updated if your vision changes.  Medicine review. Work with your health care provider to regularly review all of the medicines you are taking, including over-the-counter medicines. Ask your health care provider about any side effects that may make you more likely to have a fall. Tell your health care provider if any medicines that you take make you feel dizzy or sleepy.  Osteoporosis screening. Osteoporosis is a condition that  causes the bones to get weaker. This can make the bones weak and cause them to break more easily.  Blood pressure screening. Blood pressure changes and medicines to control blood pressure can make you feel dizzy.  Strength and balance checks. Your health care provider may recommend certain tests to check your strength and balance while standing, walking, or changing positions.  Foot health exam. Foot pain and numbness, as well as not wearing proper footwear, can make you more likely to have a fall.  Depression screening. You may be more likely to have a fall if you have a fear of falling, feel emotionally low, or feel unable to do activities that you used to do.  Alcohol use screening. Using too much alcohol can affect your balance and may make you more likely to have a fall. What actions can I take to lower my risk of falls? General instructions  Talk with your health care provider about your risks for falling. Tell your health care provider if: ? You fall. Be sure to tell your health care provider about all falls, even ones that seem minor. ? You feel dizzy, sleepy, or off-balance.  Take over-the-counter and prescription medicines only as told by your health care provider. These include any supplements.  Eat a healthy diet and maintain a healthy weight. A healthy diet includes low-fat dairy products, low-fat (lean) meats, and fiber from whole grains, beans, and lots of fruits and vegetables. Home safety  Remove any tripping hazards, such as rugs, cords, and clutter.  Install safety equipment  such as grab bars in bathrooms and safety rails on stairs.  Keep rooms and walkways well-lit. Activity   Follow a regular exercise program to stay fit. This will help you maintain your balance. Ask your health care provider what types of exercise are appropriate for you.  If you need a cane or walker, use it as recommended by your health care provider.  Wear supportive shoes that have nonskid  soles. Lifestyle  Do not drink alcohol if your health care provider tells you not to drink.  If you drink alcohol, limit how much you have: ? 0-1 drink a day for women. ? 0-2 drinks a day for men.  Be aware of how much alcohol is in your drink. In the [place] one drink equals one typical bottle of beer (12 oz), one-half glass of wine (5 oz), or one shot of hard liquor (1 oz).  Do not use any products that contain nicotine or tobacco, such as cigarettes and e-cigarettes. If you need help quitting, ask your health care provider. Summary  Having a healthy lifestyle and getting preventive care can help to protect your health and wellness after age 71.  Screening and testing are the best way to find a health problem early and help you avoid having a fall. Early diagnosis and treatment give you the best chance for managing medical conditions that are more common for people who are older than age 52.  Falls are a major cause of broken bones and head injuries in people who are older than age 18. Take precautions to prevent a fall at home.  Work with your health care provider to learn what changes you can make to improve your health and wellness and to prevent falls. This information is not intended to replace advice given to you by your health care provider. Make sure you discuss any questions you have with your health care provider. Document Revised: 02/04/2019 Document Reviewed: 08/27/2017 Elsevier Patient Education  2020 [employer].

## 2019-11-22 NOTE — Assessment & Plan Note (Signed)
Chronic, stable. Continue low dose bystolic.

## 2019-11-22 NOTE — Assessment & Plan Note (Signed)
Followed by endo Cruzita Lederer) - appreciate her care.

## 2019-11-22 NOTE — Assessment & Plan Note (Signed)
Due to elevated testosterone. Recent levels stable.

## 2019-11-22 NOTE — Assessment & Plan Note (Signed)
Presumed arthritis related - she had previous ankle fracture - discussed voltaren gel PRN.

## 2019-11-22 NOTE — Assessment & Plan Note (Signed)
Mild, stable.  

## 2019-11-22 NOTE — Assessment & Plan Note (Signed)
Encouraged healthy diet and lifestyle changes to affect sustainable weight loss.  

## 2019-11-24 ENCOUNTER — Encounter: Payer: Self-pay | Admitting: Family Medicine

## 2019-11-26 ENCOUNTER — Ambulatory Visit: Payer: Medicare Other

## 2019-11-30 ENCOUNTER — Ambulatory Visit: Payer: Medicare Other

## 2019-11-30 DIAGNOSIS — L649 Androgenic alopecia, unspecified: Secondary | ICD-10-CM | POA: Diagnosis not present

## 2019-11-30 DIAGNOSIS — D1801 Hemangioma of skin and subcutaneous tissue: Secondary | ICD-10-CM | POA: Diagnosis not present

## 2019-11-30 DIAGNOSIS — L718 Other rosacea: Secondary | ICD-10-CM | POA: Diagnosis not present

## 2019-11-30 DIAGNOSIS — L821 Other seborrheic keratosis: Secondary | ICD-10-CM | POA: Diagnosis not present

## 2019-12-01 ENCOUNTER — Ambulatory Visit: Payer: Medicare Other

## 2019-12-04 ENCOUNTER — Ambulatory Visit: Payer: Medicare PPO | Attending: Internal Medicine

## 2019-12-04 DIAGNOSIS — Z23 Encounter for immunization: Secondary | ICD-10-CM | POA: Insufficient documentation

## 2019-12-04 NOTE — Progress Notes (Signed)
   Covid-19 Vaccination Clinic  Name:  Cherylyn Teed    MRN: [id number] DOB: 1951/03/14  12/04/2019  Ms. Sol was observed post Covid-19 immunization for 15 minutes without incidence. She was provided with Vaccine Information Sheet and instruction to access the V-Safe system.   Ms. Hursey was instructed to call 911 with any severe reactions post vaccine: [name] Difficulty breathing  . Swelling of your face and throat  . A fast heartbeat  . A bad rash all over your body  . Dizziness and weakness    Immunizations Administered    Name Date Dose VIS Date Route   Pfizer COVID-19 Vaccine 12/04/2019  3:12 PM 0.3 mL 10/08/2019 Intramuscular   Manufacturer: [place]   Lot: [id number]   [place]: [id number]

## 2019-12-14 ENCOUNTER — Encounter: Payer: Self-pay | Admitting: Family Medicine

## 2019-12-29 ENCOUNTER — Ambulatory Visit: Payer: Medicare PPO | Attending: Internal Medicine

## 2019-12-29 DIAGNOSIS — Z23 Encounter for immunization: Secondary | ICD-10-CM | POA: Insufficient documentation

## 2019-12-29 NOTE — Progress Notes (Signed)
   Covid-19 Vaccination Clinic  Name:  Cynthia Roberson    MRN: [id number] DOB: 24-Jan-1951  12/29/2019  [name] was observed post Covid-19 immunization for 15 minutes without incident. She was provided with Vaccine Information Sheet and instruction to access the V-Safe system.   Ms. Fallen was instructed to call 911 with any severe reactions post vaccine: [name] Difficulty breathing  . Swelling of face and throat  . A fast heartbeat  . A bad rash all over body  . Dizziness and weakness   Immunizations Administered    Name Date Dose VIS Date Route   Pfizer COVID-19 Vaccine 12/29/2019 10:01 AM 0.3 mL 10/08/2019 Intramuscular   Manufacturer: [place]   Lot: [id number]   [place]: [id number]

## 2020-01-24 ENCOUNTER — Other Ambulatory Visit: Payer: Self-pay | Admitting: Family Medicine

## 2020-08-10 ENCOUNTER — Ambulatory Visit (HOSPITAL_COMMUNITY)
Admission: RE | Admit: 2020-08-10 | Discharge: 2020-08-10 | Disposition: A | Discharge status: Home / Self Care | Payer: Medicare Other | Source: Ambulatory Visit | Attending: Pulmonary Disease | Admitting: Pulmonary Disease

## 2020-08-10 ENCOUNTER — Other Ambulatory Visit: Payer: Self-pay | Admitting: Physician Assistant

## 2020-08-10 DIAGNOSIS — I10 Essential (primary) hypertension: Secondary | ICD-10-CM | POA: Diagnosis not present

## 2020-08-10 DIAGNOSIS — Z23 Encounter for immunization: Secondary | ICD-10-CM | POA: Diagnosis not present

## 2020-08-10 DIAGNOSIS — U071 COVID-19: Secondary | ICD-10-CM | POA: Diagnosis present

## 2020-08-10 MED ORDER — EPINEPHRINE 0.3 MG/0.3ML IJ SOAJ: 0.3000 mg | Freq: Once | INTRAMUSCULAR | Status: DC | PRN

## 2020-08-10 MED ORDER — SODIUM CHLORIDE 0.9 % IV SOLN: INTRAVENOUS | Status: DC | PRN

## 2020-08-10 MED ORDER — DIPHENHYDRAMINE HCL 50 MG/ML IJ SOLN: 50.0000 mg | Freq: Once | INTRAMUSCULAR | Status: DC | PRN

## 2020-08-10 MED ORDER — ALBUTEROL SULFATE HFA 108 (90 BASE) MCG/ACT IN AERS: 2.0000 | INHALATION_SPRAY | Freq: Once | RESPIRATORY_TRACT | Status: DC | PRN

## 2020-08-10 MED ORDER — METHYLPREDNISOLONE SODIUM SUCC 125 MG IJ SOLR: 125.0000 mg | Freq: Once | INTRAMUSCULAR | Status: DC | PRN

## 2020-08-10 MED ORDER — FAMOTIDINE IN NACL 20-0.9 MG/50ML-% IV SOLN: 20.0000 mg | Freq: Once | INTRAVENOUS | Status: DC | PRN

## 2020-08-10 MED ORDER — SODIUM CHLORIDE 0.9 % IV SOLN: Freq: Once | INTRAVENOUS | Status: AC

## 2020-08-10 NOTE — Progress Notes (Signed)
  Diagnosis: COVID-19  Physician: Dr. Joya Gaskins  Procedure: Covid Infusion Clinic Med: bamlanivimab\etesevimab infusion - Provided patient with bamlanimivab\etesevimab fact sheet for patients, parents and caregivers prior to infusion.  Complications: No immediate complications noted.  Discharge: Discharged home   Tia Masker 08/10/2020

## 2020-08-10 NOTE — Discharge Instructions (Signed)

## 2020-08-10 NOTE — Progress Notes (Signed)
I connected by phone with [name] on 08/10/2020 at 9:09 AM to discuss the potential use of a new treatment for mild to moderate COVID-19 viral infection in non-hospitalized patients.  This patient is a 69 y.o. female that meets the FDA criteria for Emergency Use Authorization of COVID monoclonal antibody casirivimab/imdevimab or bamlamivimab/estevimab.  Has a (+) direct SARS-CoV-2 viral test result  Has mild or moderate COVID-19   Is NOT hospitalized due to COVID-19  Is within 10 days of symptom onset  Has at least one of the high risk factor(s) for progression to severe COVID-19 and/or hospitalization as defined in EUA.  Specific high risk criteria : Older age (>/= 70 yo), BMI > 25 and Cardiovascular disease or hypertension   I have spoken and communicated the following to the patient or parent/caregiver regarding COVID monoclonal antibody treatment:  1. FDA has authorized the emergency use for the treatment of mild to moderate COVID-19 in adults and pediatric patients with positive results of direct SARS-CoV-2 viral testing who are 16 years of age and older weighing at least 40 kg, and who are at high risk for progressing to severe COVID-19 and/or hospitalization.  2. The significant known and potential risks and benefits of COVID monoclonal antibody, and the extent to which such potential risks and benefits are unknown.  3. Information on available alternative treatments and the risks and benefits of those alternatives, including clinical trials.  4. Patients treated with COVID monoclonal antibody should continue to self-isolate and use infection control measures (e.g., wear mask, isolate, social distance, avoid sharing personal items, clean and disinfect "high touch" surfaces, and frequent handwashing) according to CDC guidelines.   5. The patient or parent/caregiver has the option to accept or refuse COVID monoclonal antibody treatment.  After reviewing this information with  the patient, the patient has agreed to receive one of the available covid 19 monoclonal antibodies and will be provided an appropriate fact sheet prior to infusion.  Sx onset 10/9. Set up for infusion on 10/15 @ 9:30am. Directions given to [facility]. Pt is aware that insurance will be charged an infusion fee. Pt is fully vaccinated. Home test +.   Angelena Form 08/10/2020 9:09 AM

## 2020-08-31 ENCOUNTER — Other Ambulatory Visit: Payer: Self-pay

## 2020-08-31 ENCOUNTER — Encounter: Payer: Self-pay | Admitting: Internal Medicine

## 2020-08-31 ENCOUNTER — Ambulatory Visit: Payer: Medicare PPO | Admitting: Internal Medicine

## 2020-08-31 VITALS — BP 128/88 | HR 71 | Ht 61.0 in | Wt 184.6 lb

## 2020-08-31 DIAGNOSIS — R7303 Prediabetes: Secondary | ICD-10-CM

## 2020-08-31 DIAGNOSIS — Z23 Encounter for immunization: Secondary | ICD-10-CM

## 2020-08-31 DIAGNOSIS — E281 Androgen excess: Secondary | ICD-10-CM | POA: Diagnosis not present

## 2020-08-31 DIAGNOSIS — E039 Hypothyroidism, unspecified: Secondary | ICD-10-CM | POA: Diagnosis not present

## 2020-08-31 NOTE — Progress Notes (Addendum)
Patient ID: Cynthia Roberson, female   DOB: 06/17/1951, 69 y.o.   MRN: [id number]  This visit occurred during the SARS-CoV-2 public health emergency.  Safety protocols were in place, including screening questions prior to the visit, additional usage of staff PPE, and extensive cleaning of exam room while observing appropriate contact time as indicated for disinfecting solutions.   HPI  Cynthia Roberson is a 69 y.o.-year-old female, returning for post menopausal hyperandrogenism, hypothyroidism, prediabetes. Last visit with me 1 year ago.  She and her family had Covid19 07/2020. She had the Ab infusion.  She recovered completely.  Before last visit she started intermittent fasting.  She has not been very consistent with this but plans to restart.  Reviewed history: Patient had a long history of hyperandrogenism (high testosterone level), and has a history of PCOS + infertility. She could not tolerate OCPs in the past (migraines).   Reviewed the records from Dr. Howell Rucks:  The patient has a history of scalp hair thinning since her younger years, for which she has been using prescription strength, Rogaine with good results. She is using it only once daily and hair thinning has slowed down. She also has noticed increased hair growth over her face, shoulders, chest, abdomen, and change in the texture of these hairs over past several years.  No acne.  She is postmenopausal since ~ age 21. She had a always had a history of irregular menses and had been on birth control in the past. She had difficulty with conception and had used Clomid. She has had 2 successful pregnancies in the past. Following this, the patient had regular menses for some time until she attained menopause.  No signs of masculinization.  Testosterone level - elevated at 130 in 2013.  Subsequent levels were still high, however, all of the levels below were drawn while on spironolactone. We stopped checking her testosterone levels after  03/2015, when she was on spironolactone Component     Latest Ref Rng 07/06/2014 07/14/2014 10/11/2014 04/26/2015  Testosterone     3 - 41 ng/dL 173 (H)  97 (H)   Sex Hormone Binding     18 - 114 nmol/L 31     Testosterone Free     0.0 - 4.2 pg/mL 33.8 (H)  6.1 (H)   Testosterone-% Free     0.4 - 2.4 % 2.0     FSH      26.1     LH      20.3     DHEA-SO4     29.4 - 220.5 ug/dL  77.0    Testosterone, total     7.0 - 40.0 ng/dL    122.7 (H)   Reviewed previous work-up: - Ruled out for Cushing's syndrome based on 1 mg dex suppression test- morning cortisol is 1.47 (05/2013) - DHEAS 44.3, Prolactin 5.9 (05/2013) - CT Abdomen and pelvis with contrast (04/2013) - No adrenal mass or any ovarian abnormality - Ovarian US was negative (06/2014) At last visit with Dr.Phadke, the use of flutamide was discussed, as well as the possibility of oophorectomy.  She is also seeing Dr Ronita Hipps [facility]) and Dr Ubaldo Glassing (derm).  She has been on spironolactone since 05/2013 with mild elevation in her potassium levels so the dose of spironolactone was maintained low, 25 mg daily. In 2018 we decided to start spironolactone. She felt better afterwards. She noticed significantly less hair on chin especially after she started walks every 3 weeks. Now she is not waxing anymore due to  the coronavirus pandemic.  As of now, she feels that her hirsutism has worsened.  Testosterone remains slightly high: Component     Latest Ref Rng & Units 07/24/2018 09/01/2019  Testosterone, Serum (Total)     ng/dL 92 (H) 121 (H)  % Free Testosterone     % 1.2 1.2  Free Testosterone, S     pg/mL 11 (H) 15 (H)  Sex Hormone Binding Globulin     nmol/L 50.3 46.5   On spironolactone: Component     Latest Ref Rng & Units 07/24/2017  Testosterone     3 - 41 ng/dL 82 (H)  Testosterone Free     0.0 - 4.2 pg/mL 2.8  Sex Horm Binding Glob, Serum     17.3 - 125.0 nmol/L 50.8   Reviewed potassium levels: Lab Results  Component Value  Date   K 5.1 Hemolysis seen 11/17/2019   K 5.3 No hemolysis seen (H) 11/12/2018   K 4.7 11/07/2017   K 5.1 06/04/2017   K 5.2 02/21/2017   She continues on minoxidil for scalp hair loss.  She started Spearmint tea for 1 month ago lower testosterone.  Hypothyroidism, well controlled - dx in 1995 -She continues on Synthroid d.a.w. 50 mcg daily, stable dose for the last 17 years -We tried to switch to generic levothyroxine but she could not tolerate it due to palpitations.  Pt takes Synthroid: - in am - fasting - at least 30 min from b'fast - no calcium - no iron - no multivitamins - no PPIs - not on Biotin  Latest TSH was normal: Lab Results  Component Value Date   TSH 2.27 09/01/2019   Prediabetes.  - She did not tolerate  regular metformin in the past >> she continues on Metformin ER 1000 mg daily with brunch  HbA1c levels are in the prediabetic range: Lab Results  Component Value Date   HGBA1C 5.9 11/17/2019   HGBA1C 5.5 09/01/2019   HGBA1C 6.0 07/24/2018   HGBA1C 5.9 11/07/2017   HGBA1C 5.8 07/24/2017   HGBA1C 5.9 02/21/2017   HGBA1C 5.9 07/25/2016   HGBA1C 6.1 01/23/2016   HGBA1C 6.0 02/09/2015   HGBA1C 6.3 10/11/2014   HGBA1C 6.2 07/06/2014   HGBA1C 5.7 01/11/2014   HGBA1C 5.6 06/10/2013   Last eye exam: 06/2016: No DR  She started ASA 81 per Dr. Alvy Bimler >> bruising, petechiae >> stopped.  ROS: Constitutional: no weight gain/no weight loss, no fatigue, no subjective hyperthermia, no subjective hypothermia Eyes: no blurry vision, no xerophthalmia ENT: no sore throat, no nodules palpated in neck, no dysphagia, no odynophagia, no hoarseness Cardiovascular: no CP/no SOB/no palpitations/no leg swelling Respiratory: no cough/no SOB/no wheezing Gastrointestinal: no N/no V/no D/no C/no acid reflux Musculoskeletal: no muscle aches/no joint aches Skin: no rashes, no hair loss, + excess hair on chin - also back of feet Neurological: no tremors/no numbness/no  tingling/no dizziness  I reviewed pt's medications, allergies, PMH, social hx, family hx, and changes were documented in the history of present illness. Otherwise, unchanged from my initial visit note.  Past Medical History:  Diagnosis Date  . Clostridium difficile infection 2013  . Hirsutism   . Hyperandrogenemia   . Hypertension   . Osteopenia    DEXA 2016  . Other alopecia   . Polycystic ovaries   . Unspecified hypothyroidism    Past Surgical History:  Procedure Laterality Date  . COLONOSCOPY  04/2015   TA, diverticulosis, rpt 5 yrs ([place])  . COLONOSCOPY  2011  adenomatous polyp, rpt 5 yrs ([place])  . TONSILLECTOMY    . WISDOM TOOTH EXTRACTION     Social History   Social History  . Marital Status: Married    Spouse Name: N/A  . Number of Children: 2   Occupational History  . Retired Pharmacist, hospital, now Counsellor   Social History Main Topics  . Smoking status: Never Smoker   . Smokeless tobacco: Not on file  . Alcohol Use:     1-3 Glasses of wine per year  . Drug Use: No  . Sexual Activity: Yes   Current Outpatient Medications on File Prior to Visit  Medication Sig Dispense Refill  . Biotin 5 MG TABS Take 5 mg by mouth daily.     . Cholecalciferol (VITAMIN D) 2000 UNITS tablet Take 4,000 Units by mouth daily.     . cycloSPORINE (RESTASIS) 0.05 % ophthalmic emulsion Place 1 drop into both eyes 2 (two) times daily.    . Famotidine (PEPCID PO) Take by mouth as needed. OTC    . fluticasone (FLONASE) 50 MCG/ACT nasal spray Place 2 sprays into both nostrils daily.    [name] glucose blood (ONETOUCH VERIO) test strip Use to test sugars two times weekly 30 each 12  . Lactase (LACTAID PO) Take by mouth as needed.    . loratadine (CLARITIN) 10 MG tablet Take 10 mg by mouth daily as needed.     . metFORMIN (GLUCOPHAGE-XR) 500 MG 24 hr tablet Please take 1 tab 2x a day 180 tablet 3  . Minoxidil (ROGAINE MENS) 5 % FOAM Apply topically.    . nebivolol (BYSTOLIC) 2.5 MG  tablet TAKE 1 TABLET BY MOUTH DAILY 90 tablet 2  . NONFORMULARY OR COMPOUNDED ITEM 2 capsules 2 (two) times daily. Hydro-eye    . ONETOUCH DELICA LANCETS 74J MISC Use to test sugars 2 times weekly 30 each 11  . SYNTHROID 50 MCG tablet TAKE 1 TABLET BY MOUTH EVERY DAY BEFORE BREAKFAST 90 tablet 3  . Turmeric Curcumin 500 MG CAPS Take 1 capsule by mouth daily.    . vitamin B-12 (CYANOCOBALAMIN) 500 MCG tablet Take 500 mcg by mouth daily.    . vitamin E 400 UNIT capsule Take 400 Units by mouth daily.    . Zinc 50 MG TABS Take 1 tablet by mouth daily.     No current facility-administered medications on file prior to visit.   Allergies  Allergen Reactions  . Amoxicillin Rash  . Levothyroxine Palpitations    With generic levothyroxine, tolerates well the brand name.  . Statins Palpitations    Tried Crestor and others  . Sulfa Antibiotics Rash   Family History  Problem Relation Age of Onset  . Heart failure Mother   . Cancer Mother 24       lung  . Heart disease Father   . Stroke Father   . Hypertension Father    PE: BP 128/88   Pulse 71   Ht 5\' 1"  (1.549 m)   Wt 184 lb 9.6 oz (83.7 kg)   SpO2 97%   BMI 34.88 kg/m  Wt Readings from Last 3 Encounters:  08/31/20 184 lb 9.6 oz (83.7 kg)  11/22/19 181 lb 3 oz (82.2 kg)  11/17/19 182 lb (82.6 kg)   Constitutional: overweight, in NAD Eyes: PERRLA, EOMI, no exophthalmos ENT: moist mucous membranes, no thyromegaly, no cervical lymphadenopathy Cardiovascular: RRR, No MRG Respiratory: CTA B Gastrointestinal: abdomen soft, NT, ND, BS+ Musculoskeletal: no deformities, strength intact in all  4 Skin: moist, warm, no rashes, + female pattern baldness, hirsutism on chin Neurological: no tremor with outstretched hands, DTR normal in all 4  ASSESSMENT: 1. Postmenopausal hyperandrogenism - She discussed with Dr. Howell Rucks and Dr. Ronita Hipps before about the possibility of performing oophorectomy to look for ovarian hyperthecosis or small hilar  ovarian tumors. She refused. We are following her clinically and biochemically.  2. Hypothyroidism  3. Prediabetes  PLAN:  1. Postmenopausal hyperandrogenism -likely due to preexistent PCOS Patient with history of hyperandrogenism due to most likely previously undiagnosed PCOS or possibly secondary to insulin resistance or less likely).  Adrenal tumors, ovarian tumors, or ovarian hyperthecosis are less likely for her since these occurred with usually higher levels of testosterone, higher than 150 ng/mL.  Patient had repeated CT imaging of her adrenals, and also transvaginal ultrasound for evaluation of her ovaries and these tests were all negative for any masses or hyperplasia.  Her hormonal testing was also negative for hyperprolactinemia, pituitary dysfunction, Cushing syndrome. -She was previously on spironolactone but we stopped this 3 years ago and she felt better after stopping the medication. She was waxing her chin but not during the coronavirus pandemic pandemic. -At last visit, testosterone was still slightly high. We will repeat this at next lab draw along with a DHEA-S. 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 She feels this is helping a lot but she does not use it twice a day consistently.  She feels that her hair loss worsened since our last visit. -At this visit, we discussed about proceeding with bilateral oophorectomy -she feels that this is something that she would like to continue it.  She will discuss with Dr. Edwyna Ready -She also continues on Metformin ER for prediabetes and feels that this is also helping with her hirsutism -I will see her back in 1 year  2. Hypothyroidism - latest thyroid labs reviewed with pt >> normal: Lab Results  Component Value Date   TSH 2.27 09/01/2019   - she continues on Synthroid 50 mcg daily - pt feels good on this dose. - we discussed about taking the thyroid hormone every day, with water, >30 minutes before breakfast,  separated by >4 hours from acid reflux medications, calcium, iron, multivitamins. Pt. is taking it correctly. - will check thyroid tests at next lab draw: TSH and fT4 - If labs are abnormal, she will need to return for repeat TFTs in 1.5 months  3. Prediabetes -Continues Metformin ER 1000 mg milligrams. No GI side effects -At last visit, HbA1c was 5.5%, improved. However, she had another HbA1c in 10/2019 was higher, at 5.9%, still at goal - will recheck HbA1c at next lab draw - craves chocolate -She stopped walking on the treadmill but plans to restart.  Also, she has a [employer] at home and plans to start using this.  + Flu shot today.  Component     Latest Ref Rng & Units 09/04/2020  Testosterone, Serum (Total)     ng/dL 109 (H)  % Free Testosterone     % 0.9  Free Testosterone, S     pg/mL 9.8 (H)  Sex Hormone Binding Globulin     nmol/L 43.2  Hemoglobin A1C     4.6 - 6.5 % 6.1  TSH     0.35 - 4.50 uIU/mL 2.30  T4,Free(Direct)     0.60 - 1.60 ng/dL 1.09  DHEA-Sulfate, LCMS     Ug/dL Reference Range:  Adult Females (61 - 70y): <  128  68   Free testosterone level is improved.  The rest of the tests are normal.  Philemon Kingdom, MD PhD [facility]

## 2020-08-31 NOTE — Patient Instructions (Addendum)
Please come back for labs.  Please continue Synthroid 50 mcg daily.  Take the thyroid hormone every day, with water, at least 30 minutes before breakfast, separated by at least 4 hours from: - acid reflux medications - calcium - iron - multivitamins  Please continue Metformin ER 1000 mg daily.  Please come back for a follow-up appointment in 1 year.

## 2020-09-04 ENCOUNTER — Other Ambulatory Visit: Payer: Self-pay

## 2020-09-04 ENCOUNTER — Other Ambulatory Visit (INDEPENDENT_AMBULATORY_CARE_PROVIDER_SITE_OTHER): Payer: Medicare PPO

## 2020-09-04 DIAGNOSIS — E281 Androgen excess: Secondary | ICD-10-CM | POA: Diagnosis not present

## 2020-09-04 DIAGNOSIS — R7303 Prediabetes: Secondary | ICD-10-CM | POA: Diagnosis not present

## 2020-09-04 DIAGNOSIS — E039 Hypothyroidism, unspecified: Secondary | ICD-10-CM

## 2020-09-04 LAB — T4, FREE: Free T4: 1.09 ng/dL (ref 0.60–1.60)

## 2020-09-04 LAB — TSH: TSH: 2.3 u[IU]/mL (ref 0.35–4.50)

## 2020-09-04 LAB — HEMOGLOBIN A1C: Hgb A1c MFr Bld: 6.1 % (ref 4.6–6.5)

## 2020-09-10 LAB — TESTOSTERONE, FREE AND TOTAL (INCLUDES SHBG)-(MALES)
% Free Testosterone: 0.9 %
Free Testosterone, S: 9.8 pg/mL — ABNORMAL HIGH
Sex Hormone Binding Globulin: 43.2 nmol/L
Testosterone, Serum (Total): 109 ng/dL — ABNORMAL HIGH

## 2020-09-10 LAB — DHEA-SULFATE, SERUM: DHEA-Sulfate, LCMS: 68 ug/dL

## 2020-09-27 ENCOUNTER — Other Ambulatory Visit: Payer: Self-pay | Admitting: Internal Medicine

## 2020-10-03 ENCOUNTER — Other Ambulatory Visit: Payer: Self-pay | Admitting: Internal Medicine

## 2020-10-03 ENCOUNTER — Encounter: Payer: Self-pay | Admitting: Internal Medicine

## 2020-10-03 MED ORDER — ACCU-CHEK GUIDE VI STRP: ORAL_STRIP | 3 refills | Status: DC

## 2020-10-03 MED ORDER — ACCU-CHEK SOFTCLIX LANCETS MISC: 3 refills | Status: DC

## 2020-10-03 MED ORDER — ACCU-CHEK GUIDE ME W/DEVICE KIT: PACK | 1 refills | Status: DC

## 2020-10-04 DIAGNOSIS — H43823 Vitreomacular adhesion, bilateral: Secondary | ICD-10-CM | POA: Diagnosis not present

## 2020-10-04 DIAGNOSIS — H02889 Meibomian gland dysfunction of unspecified eye, unspecified eyelid: Secondary | ICD-10-CM | POA: Diagnosis not present

## 2020-10-19 ENCOUNTER — Other Ambulatory Visit: Payer: Self-pay | Admitting: Family Medicine

## 2020-11-16 ENCOUNTER — Other Ambulatory Visit: Payer: Self-pay | Admitting: Family Medicine

## 2020-11-16 DIAGNOSIS — E282 Polycystic ovarian syndrome: Secondary | ICD-10-CM

## 2020-11-16 DIAGNOSIS — D751 Secondary polycythemia: Secondary | ICD-10-CM

## 2020-11-16 DIAGNOSIS — R7303 Prediabetes: Secondary | ICD-10-CM

## 2020-11-16 DIAGNOSIS — E039 Hypothyroidism, unspecified: Secondary | ICD-10-CM

## 2020-11-16 DIAGNOSIS — I10 Essential (primary) hypertension: Secondary | ICD-10-CM

## 2020-11-16 DIAGNOSIS — E785 Hyperlipidemia, unspecified: Secondary | ICD-10-CM

## 2020-11-16 DIAGNOSIS — E559 Vitamin D deficiency, unspecified: Secondary | ICD-10-CM

## 2020-11-20 ENCOUNTER — Other Ambulatory Visit: Payer: Medicare PPO

## 2020-11-20 ENCOUNTER — Ambulatory Visit: Payer: Medicare PPO

## 2020-11-21 ENCOUNTER — Ambulatory Visit (INDEPENDENT_AMBULATORY_CARE_PROVIDER_SITE_OTHER): Payer: Medicare PPO

## 2020-11-21 DIAGNOSIS — Z0000 Encounter for general adult medical examination without abnormal findings: Secondary | ICD-10-CM | POA: Diagnosis not present

## 2020-11-21 NOTE — Patient Instructions (Signed)
Cynthia Roberson , Thank you for taking time to come for your Medicare Wellness Visit. I appreciate your ongoing commitment to your health goals. Please review the following plan we discussed and let me know if I can assist you in the future.   Screening recommendations/referrals: Colonoscopy: due, will set up appointment soon. GI has reached out to you. Mammogram: decline Bone Density: decline Recommended yearly ophthalmology/optometry visit for glaucoma screening and checkup Recommended yearly dental visit for hygiene and checkup  Vaccinations: Influenza vaccine: Up to date, completed 08/31/2020, due 05/2021 Pneumococcal vaccine: Completed series Tdap vaccine: Up to date, completed 07/08/2011, due 06/2021 Shingles vaccine: Completed series   Covid-19: 2 doses completed. Booster due  Advanced directives: Advance directive discussed with you today. Even though you declined this today please call our office should you change your mind and we can give you the proper paperwork for you to fill out.  Conditions/risks identified: hypertension  Next appointment: Follow up in one year for your annual wellness visit    Preventive Care 21 Years and Older, Female Preventive care refers to lifestyle choices and visits with your health care provider that can promote health and wellness. What does preventive care include?  A yearly physical exam. This is also called an annual well check.  Dental exams once or twice a year.  Routine eye exams. Ask your health care provider how often you should have your eyes checked.  Personal lifestyle choices, including:  Daily care of your teeth and gums.  Regular physical activity.  Eating a healthy diet.  Avoiding tobacco and drug use.  Limiting alcohol use.  Practicing safe sex.  Taking low-dose aspirin every day.  Taking vitamin and mineral supplements as recommended by your health care provider. What happens during an annual well check? The services  and screenings done by your health care provider during your annual well check will depend on your age, overall health, lifestyle risk factors, and family history of disease. Counseling  Your health care provider may ask you questions about your:  Alcohol use.  Tobacco use.  Drug use.  Emotional well-being.  Home and relationship well-being.  Sexual activity.  Eating habits.  History of falls.  Memory and ability to understand (cognition).  Work and work Statistician.  Reproductive health. Screening  You may have the following tests or measurements:  Height, weight, and BMI.  Blood pressure.  Lipid and cholesterol levels. These may be checked every 5 years, or more frequently if you are over 75 years old.  Skin check.  Lung cancer screening. You may have this screening every year starting at age 12 if you have a 30-pack-year history of smoking and currently smoke or have quit within the past 15 years.  Fecal occult blood test (FOBT) of the stool. You may have this test every year starting at age 9.  Flexible sigmoidoscopy or colonoscopy. You may have a sigmoidoscopy every 5 years or a colonoscopy every 10 years starting at age 27.  Hepatitis C blood test.  Hepatitis B blood test.  Sexually transmitted disease (STD) testing.  Diabetes screening. This is done by checking your blood sugar (glucose) after you have not eaten for a while (fasting). You may have this done every 1-3 years.  Bone density scan. This is done to screen for osteoporosis. You may have this done starting at age 2.  Mammogram. This may be done every 1-2 years. Talk to your health care provider about how often you should have regular mammograms. Talk  with your health care provider about your test results, treatment options, and if necessary, the need for more tests. Vaccines  Your health care provider may recommend certain vaccines, such as:  Influenza vaccine. This is recommended every  year.  Tetanus, diphtheria, and acellular pertussis (Tdap, Td) vaccine. You may need a Td booster every 10 years.  Zoster vaccine. You may need this after age 23.  Pneumococcal 13-valent conjugate (PCV13) vaccine. One dose is recommended after age 79.  Pneumococcal polysaccharide (PPSV23) vaccine. One dose is recommended after age 61. Talk to your health care provider about which screenings and vaccines you need and how often you need them. This information is not intended to replace advice given to you by your health care provider. Make sure you discuss any questions you have with your health care provider. Document Released: 11/10/2015 Document Revised: 07/03/2016 Document Reviewed: 08/15/2015 Elsevier Interactive Patient Education  2017 [place] Prevention in the Home Falls can cause injuries. They can happen to people of all ages. There are many things you can do to make your home safe and to help prevent falls. What can I do on the outside of my home?  Regularly fix the edges of walkways and driveways and fix any cracks.  Remove anything that might make you trip as you walk through a door, such as a raised step or threshold.  Trim any bushes or trees on the path to your home.  Use bright outdoor lighting.  Clear any walking paths of anything that might make someone trip, such as rocks or tools.  Regularly check to see if handrails are loose or broken. Make sure that both sides of any steps have handrails.  Any raised decks and porches should have guardrails on the edges.  Have any leaves, snow, or ice cleared regularly.  Use sand or salt on walking paths during winter.  Clean up any spills in your garage right away. This includes oil or grease spills. What can I do in the bathroom?  Use night lights.  Install grab bars by the toilet and in the tub and shower. Do not use towel bars as grab bars.  Use non-skid mats or decals in the tub or shower.  If you  need to sit down in the shower, use a plastic, non-slip stool.  Keep the floor dry. Clean up any water that spills on the floor as soon as it happens.  Remove soap buildup in the tub or shower regularly.  Attach bath mats securely with double-sided non-slip rug tape.  Do not have throw rugs and other things on the floor that can make you trip. What can I do in the bedroom?  Use night lights.  Make sure that you have a light by your bed that is easy to reach.  Do not use any sheets or blankets that are too big for your bed. They should not hang down onto the floor.  Have a firm chair that has side arms. You can use this for support while you get dressed.  Do not have throw rugs and other things on the floor that can make you trip. What can I do in the kitchen?  Clean up any spills right away.  Avoid walking on wet floors.  Keep items that you use a lot in easy-to-reach places.  If you need to reach something above you, use a strong step stool that has a grab bar.  Keep electrical cords out of the way.  Do  not use floor polish or wax that makes floors slippery. If you must use wax, use non-skid floor wax.  Do not have throw rugs and other things on the floor that can make you trip. What can I do with my stairs?  Do not leave any items on the stairs.  Make sure that there are handrails on both sides of the stairs and use them. Fix handrails that are broken or loose. Make sure that handrails are as long as the stairways.  Check any carpeting to make sure that it is firmly attached to the stairs. Fix any carpet that is loose or worn.  Avoid having throw rugs at the top or bottom of the stairs. If you do have throw rugs, attach them to the floor with carpet tape.  Make sure that you have a light switch at the top of the stairs and the bottom of the stairs. If you do not have them, ask someone to add them for you. What else can I do to help prevent falls?  Wear shoes  that:  Do not have high heels.  Have rubber bottoms.  Are comfortable and fit you well.  Are closed at the toe. Do not wear sandals.  If you use a stepladder:  Make sure that it is fully opened. Do not climb a closed stepladder.  Make sure that both sides of the stepladder are locked into place.  Ask someone to hold it for you, if possible.  Clearly mark and make sure that you can see:  Any grab bars or handrails.  First and last steps.  Where the edge of each step is.  Use tools that help you move around (mobility aids) if they are needed. These include:  Canes.  Walkers.  Scooters.  Crutches.  Turn on the lights when you go into a dark area. Replace any light bulbs as soon as they burn out.  Set up your furniture so you have a clear path. Avoid moving your furniture around.  If any of your floors are uneven, fix them.  If there are any pets around you, be aware of where they are.  Review your medicines with your doctor. Some medicines can make you feel dizzy. This can increase your chance of falling. Ask your doctor what other things that you can do to help prevent falls. This information is not intended to replace advice given to you by your health care provider. Make sure you discuss any questions you have with your health care provider. Document Released: 08/10/2009 Document Revised: 03/21/2016 Document Reviewed: 11/18/2014 Elsevier Interactive Patient Education  2017 [employer].

## 2020-11-21 NOTE — Progress Notes (Signed)
Subjective:   Cynthia Roberson is a 70 y.o. female who presents for Medicare Annual (Subsequent) preventive examination.  Review of Systems: N/A     I connected with patient today by a video enabled telemedicine application and verified that I am speaking with the correct person using two identifiers.  Location patient: home  Location nurse: work  Persons participating in the virtual visit: patient, nurse     I discussed the limitations, risks, security, and privacy concerns of performing an evaluation and management service by telephone and the availability of in person appointments. The patient expressed understanding and agreed to proceed.        Cardiac Risk Factors include: advanced age (>62mn, >>62women);hypertension     Objective:    Today's Vitals   There is no height or weight on file to calculate BMI.  Advanced Directives 11/21/2020 11/17/2019 11/12/2018 11/07/2017 11/06/2016  Does Patient Have a Medical Advance Directive? No No No No No  Would patient like information on creating a medical advance directive? No - Patient declined Yes (MAU/Ambulatory/Procedural Areas - Information given) No - Patient declined Yes (MAU/Ambulatory/Procedural Areas - Information given) -    Current Medications (verified) Outpatient Encounter Medications as of 11/21/2020  Medication Sig  . Accu-Chek Softclix Lancets lancets Use once a day  . Biotin 5 MG TABS Take 5 mg by mouth daily.   . Blood Glucose Monitoring Suppl (ACCU-CHEK GUIDE ME) w/Device KIT Use as advised  . Cholecalciferol (VITAMIN D) 2000 UNITS tablet Take 4,000 Units by mouth daily.   . cycloSPORINE (RESTASIS) 0.05 % ophthalmic emulsion Place 1 drop into both eyes 2 (two) times daily.  . Famotidine (PEPCID PO) Take by mouth as needed. OTC  . fluticasone (FLONASE) 50 MCG/ACT nasal spray Place 2 sprays into both nostrils daily.  .Marland Kitchenglucose blood (ACCU-CHEK GUIDE) test strip Use once a day, rotating check times  . Lactase  (LACTAID PO) Take by mouth as needed.  . loratadine (CLARITIN) 10 MG tablet Take 10 mg by mouth daily as needed.   . Minoxidil 5 % FOAM Apply topically.  . nebivolol (BYSTOLIC) 2.5 MG tablet TAKE 1 TABLET BY MOUTH DAILY  . NONFORMULARY OR COMPOUNDED ITEM 2 capsules 2 (two) times daily. Hydro-eye  . SYNTHROID 50 MCG tablet TAKE 1 TABLET BY MOUTH EVERY MORNING BEFORE BREAKFAST  . Turmeric Curcumin 500 MG CAPS Take 1 capsule by mouth daily.  . vitamin B-12 (CYANOCOBALAMIN) 500 MCG tablet Take 500 mcg by mouth daily.  . vitamin E 400 UNIT capsule Take 400 Units by mouth daily.  . Zinc 50 MG TABS Take 1 tablet by mouth daily.   No facility-administered encounter medications on file as of 11/21/2020.    Allergies (verified) Amoxicillin, Levothyroxine, Statins, and Sulfa antibiotics   History: Past Medical History:  Diagnosis Date  . Clostridium difficile infection 2013  . Hirsutism   . Hyperandrogenemia   . Hypertension   . Osteopenia    DEXA 2016  . Other alopecia   . Polycystic ovaries   . Unspecified hypothyroidism    Past Surgical History:  Procedure Laterality Date  . COLONOSCOPY  04/2015   TA, diverticulosis, rpt 5 yrs ([place])  . COLONOSCOPY  2011   adenomatous polyp, rpt 5 yrs ([place])  . TONSILLECTOMY    . WISDOM TOOTH EXTRACTION     Family History  Problem Relation Age of Onset  . Heart failure Mother   . Cancer Mother [id number]      lung  .  Heart disease Father   . Stroke Father   . Hypertension Father    Social History   Socioeconomic History  . Marital status: Married    Spouse name: [name]  . Number of children: Not on file  . Years of education: Not on file  . Highest education level: Not on file  Occupational History  . Not on file  Tobacco Use  . Smoking status: Never Smoker  . Smokeless tobacco: Never Used  Vaping Use  . Vaping Use: Never used  Substance and Sexual Activity  . Alcohol use: Yes    Comment: occassionally  . Drug use: No  . Sexual  activity: Yes  Other Topics Concern  . Not on file  Social History Narrative   Lives with husband [name]   Occ: retired, was middle Research scientist (physical sciences)    Activity: stays active caring for grandchildren   Diet: good water, fruits/vegetables daily    Social Determinants of Health   Financial Resource Strain: Low Risk   . Difficulty of Paying Living Expenses: Not hard at all  Food Insecurity: No Food Insecurity  . Worried About Charity fundraiser in the Last Year: Never true  . Ran Out of Food in the Last Year: Never true  Transportation Needs: No Transportation Needs  . Lack of Transportation (Medical): No  . Lack of Transportation (Non-Medical): No  Physical Activity: Inactive  . Days of Exercise per Week: 0 days  . Minutes of Exercise per Session: 0 min  Stress: No Stress Concern Present  . Feeling of Stress : Not at all  Social Connections: Not on file    Tobacco Counseling Counseling given: Not Answered   Clinical Intake:  Pre-visit preparation completed: Yes  Pain : 0-10 Pain Type: Chronic pain Pain Location: Ankle Pain Orientation: Right Pain Descriptors / Indicators: Aching Pain Onset: More than a month ago Pain Frequency: Intermittent Pain Relieving Factors: biofreeze  Pain Relieving Factors: biofreeze  Nutritional Risks: None Diabetes: No  How often do you need to have someone help you when you read instructions, pamphlets, or other written materials from your doctor or pharmacy?: 1 - Never  Diabetic: No Nutrition Risk Assessment:  Has the patient had any N/V/D within the last 2 months?  No  Does the patient have any non-healing wounds?  No  Has the patient had any unintentional weight loss or weight gain?  No   Diabetes:  Is the patient diabetic?  No  If diabetic, was a CBG obtained today?  N/A Did the patient bring in their glucometer from home?  N/A How often do you monitor your CBG's? N/A.   Financial Strains and Diabetes Management:  Are  you having any financial strains with the device, your supplies or your medication? N/A.  Does the patient want to be seen by Chronic Care Management for management of their diabetes?  N/A Would the patient like to be referred to a Nutritionist or for Diabetic Management?  N/A    Interpreter Needed?: No  Information entered by :: CJohnson, LPN   Activities of Daily Living In your present state of health, do you have any difficulty performing the following activities: 11/21/2020  Hearing? N  Vision? N  Difficulty concentrating or making decisions? N  Walking or climbing stairs? N  Dressing or bathing? N  Doing errands, shopping? N  Preparing Food and eating ? N  Using the Toilet? N  In the past six months, have you accidently leaked urine? N  Do you have problems with loss of bowel control? N  Managing your Medications? N  Managing your Finances? N  Housekeeping or managing your Housekeeping? N  Some recent data might be hidden    Patient Care Team: Ria Bush, MD as PCP - General (Family Medicine) Philemon Kingdom, MD as Consulting Physician (Internal Medicine) Rolm Bookbinder, MD as Consulting Physician (Dermatology) Rolley Sims, [place] as Consulting Physician (Optometry) Arta Silence, MD as Consulting Physician (Gastroenterology) Brien Few, MD as Consulting Physician (Obstetrics and Gynecology) Nyra Capes, DDS as Consulting Physician (Dentistry) Harrie Foreman, MD as Consulting Physician (Neurology)  Indicate any recent Medical Services you may have received from other than [facility] providers in the past year (date may be approximate).     Assessment:   This is a routine wellness examination for [place].  Hearing/Vision screen  Hearing Screening   125Hz  250Hz  500Hz  1000Hz  2000Hz  3000Hz  4000Hz  6000Hz  8000Hz   Right ear:           Left ear:           Vision Screening Comments: Patient gets annual eye exams   Dietary issues and exercise activities  discussed: Current Exercise Habits: The patient does not participate in regular exercise at present, Exercise limited by: None identified  Goals    . Follow up with Primary Care Provider     Starting 11/12/2018, I will continue to take medications as prescribed and to keep appointments with PCP as scheduled.     . Patient Stated     11/17/2019, I will try to start eating a much healthier diet.    . Patient Stated     11/21/2020, I will maintain and continue medications as prescribed.       Depression Screen PHQ 2/9 Scores 11/21/2020 11/17/2019 11/12/2018 11/07/2017 11/06/2016  PHQ - 2 Score 0 0 0 0 0  PHQ- 9 Score 0 0 0 0 -    Fall Risk Fall Risk  11/21/2020 11/17/2019 11/12/2018 11/07/2017 11/06/2016  Falls in the past year? 0 0 0 No No  Number falls in past yr: 0 0 - - -  Injury with Fall? 0 0 - - -  Risk for fall due to : No Fall Risks Medication side effect - - -  Follow up Falls evaluation completed;Falls prevention discussed Falls evaluation completed;Falls prevention discussed - - -    FALL RISK PREVENTION PERTAINING TO THE HOME:  Any stairs in or around the home? Yes  If so, are there any without handrails? No  Home free of loose throw rugs in walkways, pet beds, electrical cords, etc? Yes  Adequate lighting in your home to reduce risk of falls? Yes   ASSISTIVE DEVICES UTILIZED TO PREVENT FALLS:  Life alert? No  Use of a cane, walker or w/c? No  Grab bars in the bathroom? No  Shower chair or bench in shower? No  Elevated toilet seat or a handicapped toilet? No   TIMED UP AND GO:  Was the test performed? N/A telephone/virtual visit .   Cognitive Function: MMSE - Mini Mental State Exam 11/21/2020 11/17/2019 11/12/2018 11/07/2017 11/06/2016  Not completed: Unable to complete - - - -  Orientation to time - 5 5 5 5   Orientation to Place - 5 5 5 5   Registration - 3 3 3 3   Attention/ Calculation - 5 0 0 0  Recall - 3 3 3 3   Language- name 2 objects - - 0 0 0  Language- repeat  - 1 1 1  1  Language- follow 3 step command - - 3 3 3   Language- read & follow direction - - 0 0 0  Write a sentence - - 0 0 0  Copy design - - 0 0 0  Total score - - 20 20 20   Normal cognitive status assessed by direct observation by this Nurse Health Advisor. No abnormalities found.   Mini Cog  Mini-Cog screen was not completed. Did not have time to complete. Maximum score is 22. A value of 0 denotes this part of the MMSE was not completed or the patient failed this part of the Mini-Cog screening.       Immunizations Immunization History  Administered Date(s) Administered  . Fluad Quad(high Dose 65+) 09/01/2019, 08/31/2020  . Influenza Split 07/11/2014  . Influenza, High Dose Seasonal PF 07/24/2017, 07/24/2018  . Influenza,inj,Quad PF,6+ Mos 07/28/2015, 07/05/2016  . Influenza-Unspecified 07/24/2017  . PFIZER(Purple Top)SARS-COV-2 Vaccination 12/04/2019, 12/29/2019  . Pneumococcal Conjugate-13 08/15/2015  . Pneumococcal Polysaccharide-23 11/11/2016  . Tdap 04/11/2011  . Zoster 01/13/2014  . Zoster Recombinat (Shingrix) 12/09/2017, 11/02/2018     TDAP status: Up to date  Flu Vaccine status: Up to date  Pneumococcal vaccine status: Up to date  Covid-19 vaccine status: 2 doses completed, booster due   Qualifies for Shingles Vaccine? Yes   Zostavax completed Yes   Shingrix Completed?: Yes  Screening Tests Health Maintenance  Topic Date Due  . MAMMOGRAM  12/16/2018  . COLONOSCOPY (Pts 45-25yr Insurance coverage will need to be confirmed)  05/22/2020  . COVID-19 Vaccine (3 - Booster for Pfizer series) 06/30/2020  . TETANUS/TDAP  04/10/2021  . INFLUENZA VACCINE  Completed  . DEXA SCAN  Completed  . Hepatitis C Screening  Completed  . PNA vac Low Risk Adult  Completed    Health Maintenance  Health Maintenance Due  Topic Date Due  . MAMMOGRAM  12/16/2018  . COLONOSCOPY (Pts 45-443yrInsurance coverage will need to be confirmed)  05/22/2020  . COVID-19 Vaccine (3  - Booster for Pfizer series) 06/30/2020    Colorectal cancer screening: due, patient will call and schedule this appointment soon   Mammogram status: decline at this time  Bone Density status: declined at this time  Lung Cancer Screening: (Low Dose CT Chest recommended if Age 71-80ears, 30 pack-year currently smoking OR have quit w/in 15 years.) does not qualify.    Additional Screening:  Hepatitis C Screening: does qualify; Completed 10/29/1991  Vision Screening: Recommended annual ophthalmology exams for early detection of glaucoma and other disorders of the eye. Is the patient up to date with their annual eye exam?  Yes  Who is the provider or what is the name of the office in which the patient attends annual eye exams? Dr. AdJulien Girtf pt is not established with a provider, would they like to be referred to a provider to establish care? No .   Dental Screening: Recommended annual dental exams for proper oral hygiene  Community Resource Referral / Chronic Care Management: CRR required this visit?  No   CCM required this visit?  No      Plan:     I have personally reviewed and noted the following in the patient's chart:   . Medical and social history . Use of alcohol, tobacco or illicit drugs  . Current medications and supplements . Functional ability and status . Nutritional status . Physical activity . Advanced directives . List of other physicians . Hospitalizations, surgeries, and ER visits in previous  12 months . Vitals . Screenings to include cognitive, depression, and falls . Referrals and appointments  In addition, I have reviewed and discussed with patient certain preventive protocols, quality metrics, and best practice recommendations. A written personalized care plan for preventive services as well as general preventive health recommendations were provided to patient.   Due to this being a telephonic/virtual visit, the after visit summary with patients  personalized plan was offered to patient via office or my-chart. Patient preferred to pick up at office at next visit or via mychart.   Andrez Grime, LPN   [id number]

## 2020-11-21 NOTE — Progress Notes (Signed)
PCP notes:  Health Maintenance: Colonoscopy- due Mammogram- decline Dexa- decline COVID booster- due   Abnormal Screenings: none   Patient concerns: none  Nurse concerns: none   Next PCP appt.: 12/11/2020 @ 12 pm

## 2020-11-23 ENCOUNTER — Encounter: Payer: Medicare PPO | Admitting: Family Medicine

## 2020-11-24 ENCOUNTER — Ambulatory Visit: Payer: Medicare PPO | Admitting: Family Medicine

## 2020-12-04 ENCOUNTER — Other Ambulatory Visit: Payer: Medicare PPO

## 2020-12-06 ENCOUNTER — Other Ambulatory Visit (INDEPENDENT_AMBULATORY_CARE_PROVIDER_SITE_OTHER): Payer: Medicare PPO

## 2020-12-06 ENCOUNTER — Other Ambulatory Visit: Payer: Self-pay

## 2020-12-06 DIAGNOSIS — E559 Vitamin D deficiency, unspecified: Secondary | ICD-10-CM

## 2020-12-06 DIAGNOSIS — E785 Hyperlipidemia, unspecified: Secondary | ICD-10-CM | POA: Diagnosis not present

## 2020-12-06 DIAGNOSIS — D751 Secondary polycythemia: Secondary | ICD-10-CM

## 2020-12-06 DIAGNOSIS — I10 Essential (primary) hypertension: Secondary | ICD-10-CM

## 2020-12-06 LAB — COMPREHENSIVE METABOLIC PANEL
ALT: 34 U/L (ref 0–35)
AST: 24 U/L (ref 0–37)
Albumin: 4.3 g/dL (ref 3.5–5.2)
Alkaline Phosphatase: 60 U/L (ref 39–117)
BUN: 11 mg/dL (ref 6–23)
CO2: 30 mEq/L (ref 19–32)
Calcium: 9.6 mg/dL (ref 8.4–10.5)
Chloride: 104 mEq/L (ref 96–112)
Creatinine, Ser: 0.93 mg/dL (ref 0.40–1.20)
GFR: 62.18 mL/min (ref >60.00)
Glucose, Bld: 95 mg/dL (ref 70–99)
Potassium: 4.7 mEq/L (ref 3.5–5.1)
Sodium: 138 mEq/L (ref 135–145)
Total Bilirubin: 0.6 mg/dL (ref 0.2–1.2)
Total Protein: 7 g/dL (ref 6.0–8.3)

## 2020-12-06 LAB — LIPID PANEL
Cholesterol: 232 mg/dL — ABNORMAL HIGH (ref 0–200)
HDL: 55.1 mg/dL (ref >39.00)
LDL Cholesterol: 139 mg/dL — ABNORMAL HIGH (ref 0–99)
NonHDL: 176.9
Total CHOL/HDL Ratio: 4
Triglycerides: 189 mg/dL — ABNORMAL HIGH (ref 0.0–149.0)
VLDL: 37.8 mg/dL (ref 0.0–40.0)

## 2020-12-06 LAB — CBC WITH DIFFERENTIAL/PLATELET
Basophils Absolute: 0 10*3/uL (ref 0.0–0.1)
Basophils Relative: 0.6 % (ref 0.0–3.0)
Eosinophils Absolute: 0.2 10*3/uL (ref 0.0–0.7)
Eosinophils Relative: 3.8 % (ref 0.0–5.0)
HCT: 46.3 % — ABNORMAL HIGH (ref 36.0–46.0)
Hemoglobin: 14.9 g/dL (ref 12.0–15.0)
Lymphocytes Relative: 44.3 % (ref 12.0–46.0)
Lymphs Abs: 2.5 10*3/uL (ref 0.7–4.0)
MCHC: 32.1 g/dL (ref 30.0–36.0)
MCV: 80 fl (ref 78.0–100.0)
Monocytes Absolute: 0.5 10*3/uL (ref 0.1–1.0)
Monocytes Relative: 7.9 % (ref 3.0–12.0)
Neutro Abs: 2.5 10*3/uL (ref 1.4–7.7)
Neutrophils Relative %: 43.4 % (ref 43.0–77.0)
Platelets: 238 10*3/uL (ref 150.0–400.0)
RBC: 5.78 Mil/uL — ABNORMAL HIGH (ref 3.87–5.11)
RDW: 14.3 % (ref 11.5–15.5)
WBC: 5.7 10*3/uL (ref 4.0–10.5)

## 2020-12-06 LAB — MICROALBUMIN / CREATININE URINE RATIO
Creatinine,U: 103.5 mg/dL
Microalb Creat Ratio: 5.2 mg/g (ref 0.0–30.0)
Microalb, Ur: 5.3 mg/dL — ABNORMAL HIGH (ref 0.0–1.9)

## 2020-12-06 LAB — VITAMIN D 25 HYDROXY (VIT D DEFICIENCY, FRACTURES): VITD: 45.53 ng/mL (ref 30.00–100.00)

## 2020-12-11 ENCOUNTER — Other Ambulatory Visit: Payer: Self-pay

## 2020-12-11 ENCOUNTER — Ambulatory Visit (INDEPENDENT_AMBULATORY_CARE_PROVIDER_SITE_OTHER): Payer: Medicare PPO | Admitting: Family Medicine

## 2020-12-11 ENCOUNTER — Encounter: Payer: Self-pay | Admitting: Family Medicine

## 2020-12-11 VITALS — BP 120/82 | HR 75 | Temp 98.0°F | Ht 61.0 in | Wt 188.1 lb

## 2020-12-11 DIAGNOSIS — R7303 Prediabetes: Secondary | ICD-10-CM | POA: Diagnosis not present

## 2020-12-11 DIAGNOSIS — E039 Hypothyroidism, unspecified: Secondary | ICD-10-CM | POA: Diagnosis not present

## 2020-12-11 DIAGNOSIS — D751 Secondary polycythemia: Secondary | ICD-10-CM | POA: Diagnosis not present

## 2020-12-11 DIAGNOSIS — Z7189 Other specified counseling: Secondary | ICD-10-CM

## 2020-12-11 DIAGNOSIS — E559 Vitamin D deficiency, unspecified: Secondary | ICD-10-CM

## 2020-12-11 DIAGNOSIS — E785 Hyperlipidemia, unspecified: Secondary | ICD-10-CM

## 2020-12-11 DIAGNOSIS — M8580 Other specified disorders of bone density and structure, unspecified site: Secondary | ICD-10-CM

## 2020-12-11 DIAGNOSIS — M25571 Pain in right ankle and joints of right foot: Secondary | ICD-10-CM

## 2020-12-11 DIAGNOSIS — I10 Essential (primary) hypertension: Secondary | ICD-10-CM | POA: Diagnosis not present

## 2020-12-11 DIAGNOSIS — G8929 Other chronic pain: Secondary | ICD-10-CM

## 2020-12-11 DIAGNOSIS — Z0000 Encounter for general adult medical examination without abnormal findings: Secondary | ICD-10-CM | POA: Diagnosis not present

## 2020-12-11 DIAGNOSIS — Z72821 Inadequate sleep hygiene: Secondary | ICD-10-CM

## 2020-12-11 DIAGNOSIS — E282 Polycystic ovarian syndrome: Secondary | ICD-10-CM

## 2020-12-11 LAB — POCT GLYCOSYLATED HEMOGLOBIN (HGB A1C): Hemoglobin A1C: 5.8 % — AB (ref 4.0–5.6)

## 2020-12-11 MED ORDER — VITAMIN D-3 125 MCG (5000 UT) PO TABS: 1.0000 | ORAL_TABLET | Freq: Every day | ORAL | Status: DC

## 2020-12-11 NOTE — Patient Instructions (Addendum)
Fingerstick A1c today.  Call to schedule colonoscopy.  Call to schedule well woman and mammogram with Dr Kennith Maes office (last done 2019), send [place] results. Check on when next bone density scan is due.  Work on advanced directive - new packet provided.  Return in 3 months for labs to recheck cholesterol.  Return in 1 year for next physical.   Sleep hygiene checklist: 1. Avoid naps during the day 2. Avoid stimulants such as caffeine and nicotine. Avoid bedtime alcohol (it can speed onset of sleep but the body's metabolism can cause awakenings). 3. All forms of exercise help ensure sound sleep - limit vigorous exercise to morning or late afternoon 4. Avoid food too close to bedtime including chocolate (which contains caffeine) 5. Soak up natural light 6. Establish regular bedtime routine. 7. Associate bed with sleep - avoid TV, computer or phone, reading while in bed. 8. Ensure pleasant, relaxing sleep environment - quiet, dark, cool room.   Health Maintenance After Age 73 After age 14, you are at a higher risk for certain long-term diseases and infections as well as injuries from falls. Falls are a major cause of broken bones and head injuries in people who are older than age 42. Getting regular preventive care can help to keep you healthy and well. Preventive care includes getting regular testing and making lifestyle changes as recommended by your health care provider. Talk with your health care provider about:  Which screenings and tests you should have. A screening is a test that checks for a disease when you have no symptoms.  A diet and exercise plan that is right for you. What should I know about screenings and tests to prevent falls? Screening and testing are the best ways to find a health problem early. Early diagnosis and treatment give you the best chance of managing medical conditions that are common after age 70. Certain conditions and lifestyle choices may make you more likely to  have a fall. Your health care provider may recommend:  Regular vision checks. Poor vision and conditions such as cataracts can make you more likely to have a fall. If you wear glasses, make sure to get your prescription updated if your vision changes.  Medicine review. Work with your health care provider to regularly review all of the medicines you are taking, including over-the-counter medicines. Ask your health care provider about any side effects that may make you more likely to have a fall. Tell your health care provider if any medicines that you take make you feel dizzy or sleepy.  Osteoporosis screening. Osteoporosis is a condition that causes the bones to get weaker. This can make the bones weak and cause them to break more easily.  Blood pressure screening. Blood pressure changes and medicines to control blood pressure can make you feel dizzy.  Strength and balance checks. Your health care provider may recommend certain tests to check your strength and balance while standing, walking, or changing positions.  Foot health exam. Foot pain and numbness, as well as not wearing proper footwear, can make you more likely to have a fall.  Depression screening. You may be more likely to have a fall if you have a fear of falling, feel emotionally low, or feel unable to do activities that you used to do.  Alcohol use screening. Using too much alcohol can affect your balance and may make you more likely to have a fall. What actions can I take to lower my risk of falls? General instructions  Talk with your health care provider about your risks for falling. Tell your health care provider if: ? You fall. Be sure to tell your health care provider about all falls, even ones that seem minor. ? You feel dizzy, sleepy, or off-balance.  Take over-the-counter and prescription medicines only as told by your health care provider. These include any supplements.  Eat a healthy diet and maintain a healthy  weight. A healthy diet includes low-fat dairy products, low-fat (lean) meats, and fiber from whole grains, beans, and lots of fruits and vegetables. Home safety  Remove any tripping hazards, such as rugs, cords, and clutter.  Install safety equipment such as grab bars in bathrooms and safety rails on stairs.  Keep rooms and walkways well-lit. Activity  Follow a regular exercise program to stay fit. This will help you maintain your balance. Ask your health care provider what types of exercise are appropriate for you.  If you need a cane or walker, use it as recommended by your health care provider.  Wear supportive shoes that have nonskid soles.   Lifestyle  Do not drink alcohol if your health care provider tells you not to drink.  If you drink alcohol, limit how much you have: ? 0-1 drink a day for women. ? 0-2 drinks a day for men.  Be aware of how much alcohol is in your drink. In the [place] one drink equals one typical bottle of beer (12 oz), one-half glass of wine (5 oz), or one shot of hard liquor (1 oz).  Do not use any products that contain nicotine or tobacco, such as cigarettes and e-cigarettes. If you need help quitting, ask your health care provider. Summary  Having a healthy lifestyle and getting preventive care can help to protect your health and wellness after age 72.  Screening and testing are the best way to find a health problem early and help you avoid having a fall. Early diagnosis and treatment give you the best chance for managing medical conditions that are more common for people who are older than age 59.  Falls are a major cause of broken bones and head injuries in people who are older than age 17. Take precautions to prevent a fall at home.  Work with your health care provider to learn what changes you can make to improve your health and wellness and to prevent falls. This information is not intended to replace advice given to you by your health care  provider. Make sure you discuss any questions you have with your health care provider. Document Revised: 02/04/2019 Document Reviewed: 08/27/2017 Elsevier Patient Education  2021 [employer].

## 2020-12-11 NOTE — Assessment & Plan Note (Signed)
Advanced directive discussion -received advanced directive packet 2020 - continues working on this.Husband would be HCPOA then daughter [name]

## 2020-12-11 NOTE — Assessment & Plan Note (Signed)
Preventative protocols reviewed and updated unless pt declined. Discussed healthy diet and lifestyle.  

## 2020-12-11 NOTE — Progress Notes (Signed)
Patient ID: Cynthia Roberson, female    DOB: [birth year], 70 y.o.   MRN: [id number]  This visit was conducted in person.  BP 120/82    Pulse 75    Temp 98 F (36.7 C) (Temporal)    Ht _0  (1.549 m)    Wt 188 lb 2 oz (85.3 kg)    SpO2 97%    BMI 35.55 kg/m    CC: CPE Subjective:   HPI: Cynthia Roberson is a 70 y.o. female presenting on 12/11/2020 for Annual Exam (Prt 2. )   Saw health advisor last month for medicare wellness visit. Note reviewed.    No exam data present  Flowsheet Row Clinical Support from 11/21/2020 in [facility]  PHQ-2 Total Score 0      Fall Risk  11/21/2020 11/17/2019 11/12/2018 11/07/2017 11/06/2016  Falls in the past year? 0 0 0 No No  Number falls in past yr: 0 0 - - -  Injury with Fall? 0 0 - - -  Risk for fall due to : No Fall Risks Medication side effect - - -  Follow up Falls evaluation completed;Falls prevention discussed Falls evaluation completed;Falls prevention discussed - - -    New 4th grandson 3 wks ago - difficult pregnancy but baby is doing well.   Sees Dr Cruzita Lederer for PCOS and hypothyroidism. Has spoken with endo about saw palmetto for hair loss. Interested in trying this. Also using new shampoo. She stopped metformin 09/2020 due to chronic diarrhea with improvement off med. Interested in rpt A1c off metformin   Intermittent R lateral ankle pain since closed nondisplaced fracture of R lateral malleolus 2019. Now having discomfort at R achilles tendon as well as sharp discomfort anterior ankle area - managing with ice with benefit. Denies inciting trauma/injury. Using Dr Felicie Morn insoles.   COVID infection 07/2020 largely asymptomatic - symptoms fully resolved.  Noted bruise to lip yesterday - wonders if she bit her lip overnight. Started as blood blister that is improving.   Ongoing blood tinged mucous associated with nasal congestion - runs vaporizer in winter. No fevers/chills, or facial pain. Regularly takes mucinex and  vicks vaporub to chest with benefit.   Preventative: COLONOSCOPY 04/2015 - TA, diverticulosis, rpt 5 yrs (Outlaw). Due - will call for appointment. Well woman - sees Dr Fannie Knee- aged out of cervical cancer screening Breast cancer screening -at OBGYN office - due  DEXA - h/o osteopenia, unsure date - followed by OBGYN. due Lung cancer screening -not eligible  Flu shot -yearly  Louisa 11/2019, 12/2019  Tdap 2012 prevnar 2016, pneumovax2018  zostavax-2015  shingrix -completed 11/2017, 10/2018  Advanced directive discussion -received advanced directive packet 2020 - continues working on this.Husband would be HCPOA then daughter [name]  Seat belt use discussed  Sunscreen use discussed, no changing moles on skin - sees derm yearly [name])  Nonsmoker  Alcohol - rare  Dentist q6 mo  Eye exam q6 mo [name]) (dry eyes on restasis)  Bowel - no constipation  Bladder - some stress incontinence   Lives with husband [name]  Occ: retired, was middle Research scientist (physical sciences) Activity: stays active caring for grandchildren- hasn't used treadmill recently Diet: good water, fruits/vegetables daily     Relevant past medical, surgical, family and social history reviewed and updated as indicated. Interim medical history since our last visit reviewed. Allergies and medications reviewed and updated. Outpatient Medications Prior to Visit  Medication Sig Dispense Refill  Accu-Chek Softclix Lancets lancets Use once a day 100 each 3   Biotin 5 MG TABS Take 5 mg by mouth daily.      Blood Glucose Monitoring Suppl (ACCU-CHEK GUIDE ME) w/Device KIT Use as advised 1 kit 1   cycloSPORINE (RESTASIS) 0.05 % ophthalmic emulsion Place 1 drop into both eyes 2 (two) times daily.     Famotidine (PEPCID PO) Take by mouth as needed. OTC     fluticasone (FLONASE) 50 MCG/ACT nasal spray Place 2 sprays into both nostrils daily.     glucose blood (ACCU-CHEK GUIDE) test strip Use once a  day, rotating check times 100 each 3   Lactase (LACTAID PO) Take by mouth as needed.     loratadine (CLARITIN) 10 MG tablet Take 10 mg by mouth daily as needed.      Minoxidil 5 % FOAM Apply topically.     nebivolol (BYSTOLIC) 2.5 MG tablet TAKE 1 TABLET BY MOUTH DAILY 90 tablet 0   NONFORMULARY OR COMPOUNDED ITEM 2 capsules 2 (two) times daily. Hydro-eye     Probiotic Product (PROBIOTIC PO) Take by mouth daily.     SYNTHROID 50 MCG tablet TAKE 1 TABLET BY MOUTH EVERY MORNING BEFORE BREAKFAST 90 tablet 3   Turmeric Curcumin 500 MG CAPS Take 1 capsule by mouth daily.     vitamin B-12 (CYANOCOBALAMIN) 500 MCG tablet Take 500 mcg by mouth daily.     vitamin E 400 UNIT capsule Take 400 Units by mouth daily.     Zinc 50 MG TABS Take 1 tablet by mouth daily.     Cholecalciferol (VITAMIN D) 2000 UNITS tablet Take 4,000 Units by mouth daily.      No facility-administered medications prior to visit.     Per HPI unless specifically indicated in ROS section below Review of Systems  Constitutional: Negative for activity change, appetite change, chills, fatigue, fever and unexpected weight change.  HENT: Negative for hearing loss.   Eyes: Negative for visual disturbance.  Respiratory: Negative for cough, chest tightness, shortness of breath and wheezing.   Cardiovascular: Negative for chest pain, palpitations and leg swelling.  Gastrointestinal: Negative for abdominal distention, abdominal pain, blood in stool, constipation, diarrhea (improved off metformin), nausea and vomiting.  Genitourinary: Negative for difficulty urinating and hematuria.  Musculoskeletal: Negative for arthralgias, myalgias and neck pain.  Skin: Negative for rash.  Neurological: Negative for dizziness, seizures, syncope and headaches.  Hematological: Negative for adenopathy. Does not bruise/bleed easily.  Psychiatric/Behavioral: Negative for dysphoric mood. The patient is not nervous/anxious.    Objective:  BP  120/82    Pulse 75    Temp 98 F (36.7 C) (Temporal)    Ht _0  (1.549 m)    Wt 188 lb 2 oz (85.3 kg)    SpO2 97%    BMI 35.55 kg/m   Wt Readings from Last 3 Encounters:  12/11/20 188 lb 2 oz (85.3 kg)  08/31/20 184 lb 9.6 oz (83.7 kg)  11/22/19 181 lb 3 oz (82.2 kg)      Physical Exam Vitals and nursing note reviewed.  Constitutional:      General: She is not in acute distress.    Appearance: Normal appearance. She is well-developed and well-nourished. She is obese. She is not ill-appearing.  HENT:     Head: Normocephalic and atraumatic.     Right Ear: Hearing, tympanic membrane, ear canal and external ear normal.     Left Ear: Hearing, tympanic membrane, ear canal and external  ear normal.     Mouth/Throat:     Mouth: Oropharynx is clear and moist and mucous membranes are normal.     Pharynx: No posterior oropharyngeal edema.  Eyes:     General: No scleral icterus.    Extraocular Movements: Extraocular movements intact and EOM normal.     Conjunctiva/sclera: Conjunctivae normal.     Pupils: Pupils are equal, round, and reactive to light.  Neck:     Thyroid: No thyroid mass or thyromegaly.     Vascular: No carotid bruit.  Cardiovascular:     Rate and Rhythm: Normal rate and regular rhythm.     Pulses: Normal pulses and intact distal pulses.          Radial pulses are 2+ on the right side and 2+ on the left side.     Heart sounds: Normal heart sounds. No murmur heard.   Pulmonary:     Effort: Pulmonary effort is normal. No respiratory distress.     Breath sounds: Normal breath sounds. No wheezing, rhonchi or rales.  Abdominal:     General: Abdomen is flat. Bowel sounds are normal. There is no distension.     Palpations: Abdomen is soft. There is no mass.     Tenderness: There is no abdominal tenderness. There is no guarding or rebound.     Hernia: No hernia is present.  Musculoskeletal:        General: No edema. Normal range of motion.     Cervical back: Normal range of  motion and neck supple.     Right lower leg: No edema.     Left lower leg: No edema.  Lymphadenopathy:     Cervical: No cervical adenopathy.  Skin:    General: Skin is warm and dry.     Findings: No rash.  Neurological:     General: No focal deficit present.     Mental Status: She is alert and oriented to person, place, and time.     Comments: CN grossly intact, station and gait intact  Psychiatric:        Mood and Affect: Mood and affect and mood normal.        Behavior: Behavior normal.        Thought Content: Thought content normal.        Judgment: Judgment normal.       Results for orders placed or performed in visit on 12/11/20  POCT glycosylated hemoglobin (Hb A1C)  Result Value Ref Range   Hemoglobin A1C 5.8 (A) 4.0 - 5.6 %   HbA1c POC (<> result, manual entry)     HbA1c, POC (prediabetic range)     HbA1c, POC (controlled diabetic range)     Lab Results  Component Value Date   CHOL 232 (H) 12/06/2020   HDL 55.10 12/06/2020   LDLCALC 139 (H) 12/06/2020   TRIG 189.0 (H) 12/06/2020   CHOLHDL 4 12/06/2020    Lab Results  Component Value Date   CREATININE 0.93 12/06/2020   BUN 11 12/06/2020   NA 138 12/06/2020   K 4.7 12/06/2020   CL 104 12/06/2020   CO2 30 12/06/2020    Lab Results  Component Value Date   WBC 5.7 12/06/2020   HGB 14.9 12/06/2020   HCT 46.3 (H) 12/06/2020   MCV 80.0 12/06/2020   PLT 238.0 12/06/2020    Assessment & Plan:  This visit occurred during the SARS-CoV-2 public health emergency.  Safety protocols were in place, including screening questions  prior to the visit, additional usage of staff PPE, and extensive cleaning of exam room while observing appropriate contact time as indicated for disinfecting solutions.   Problem List Items Addressed This Visit    Vitamin D deficiency    Levels stable on 4000-5000IU daily.       Severe obesity (BMI 35.0-39.9) with comorbidity ([place])    Weight gain noted.       Routine general medical  examination at a health care facility - Primary    Preventative protocols reviewed and updated unless pt declined. Discussed healthy diet and lifestyle.       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 Exam WNL today. Not consistent with achilles tendinopathy, plantar fasciitis or calcaneal fracture.  Consider ankle lace up brace for extra support.       Prediabetes    Chronic, off metformin - update A1c.       Relevant Orders   POCT glycosylated hemoglobin (Hb A1C) (Completed)   Poor sleep hygiene    Overall restorative sleep however poor sleep hygiene. Handout provided      Polycythemia, secondary    Recent CBC WNL.       PCOS (polycystic ovarian syndrome)    Continue endo f/u ([place])      Osteopenia    Encouraged call GYN to schedule f/u DEXA as may be overdue.       Hypothyroidism    Continue endo f/u on brand synthroid.       Essential hypertension, benign    Chronic, stable. Continue nebivolol  Consider ACEI/ARB.       Dyslipidemia    Chronic off med. Consider medication. Statins previously caused palpitations.  The 10-year ASCVD risk score Mikey Bussing DC Brooke Bonito., et al., 2013) is: 11.6%   Values used to calculate the score:     Age: 80 years     Sex: Female     Is Non-Hispanic African American: No     Diabetic: No     Tobacco smoker: No     Systolic Blood Pressure: 793 mmHg     Is BP treated: Yes     HDL Cholesterol: 55.1 mg/dL     Total Cholesterol: 232 mg/dL       Advanced care planning/counseling discussion    Advanced directive discussion -received advanced directive packet 2020 - continues working on this.Husband would be HCPOA then daughter [name]           Meds ordered this encounter  Medications   Cholecalciferol (VITAMIN D-3) 125 MCG (5000 UT) TABS    Sig: Take 1 tablet by mouth daily.    Dispense:  30 tablet   Orders Placed This Encounter  Procedures   POCT  glycosylated hemoglobin (Hb A1C)    Patient instructions: Fingerstick A1c today.  Call to schedule colonoscopy.  Call to schedule well woman and mammogram with Dr Kennith Maes office (last done 2019), send [place] results. Check on when next bone density scan is due.  Work on advanced directive - new packet provided.  Return in 3 months for labs to recheck cholesterol.  Return in 1 year for next physical.   Sleep hygiene checklist: 1. Avoid naps during the day 2. Avoid stimulants such as caffeine and nicotine. Avoid bedtime alcohol (it can speed onset of sleep but the body's metabolism can cause awakenings). 3. All forms of exercise help ensure sound sleep - limit vigorous exercise to morning or late afternoon 4.  Avoid food too close to bedtime including chocolate (which contains caffeine) 5. Soak up natural light 6. Establish regular bedtime routine. 7. Associate bed with sleep - avoid TV, computer or phone, reading while in bed. 8. Ensure pleasant, relaxing sleep environment - quiet, dark, cool room.   Follow up plan: Return in about 1 year (around 12/11/2021) for annual exam, prior fasting for blood work, medicare wellness visit.  Ria Bush, MD

## 2020-12-14 DIAGNOSIS — Z72821 Inadequate sleep hygiene: Secondary | ICD-10-CM | POA: Insufficient documentation

## 2020-12-14 NOTE — Assessment & Plan Note (Signed)
Levels stable on 4000-5000IU daily.

## 2020-12-14 NOTE — Assessment & Plan Note (Signed)
Chronic off med. Consider medication. Statins previously caused palpitations.  The 10-year ASCVD risk score Mikey Bussing DC Brooke Bonito., et al., 2013) is: 11.6%   Values used to calculate the score:     Age: 70 years     Sex: Female     Is Non-Hispanic African American: No     Diabetic: No     Tobacco smoker: No     Systolic Blood Pressure: 748 mmHg     Is BP treated: Yes     HDL Cholesterol: 55.1 mg/dL     Total Cholesterol: 232 mg/dL

## 2020-12-14 NOTE — Assessment & Plan Note (Addendum)
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 Exam WNL today. Not consistent with achilles tendinopathy, plantar fasciitis or calcaneal fracture.  Consider ankle lace up brace for extra support.

## 2020-12-14 NOTE — Assessment & Plan Note (Signed)
Continue endo f/u on brand synthroid.

## 2020-12-14 NOTE — Assessment & Plan Note (Signed)
Chronic, off metformin - update A1c.

## 2020-12-14 NOTE — Assessment & Plan Note (Signed)
Encouraged call GYN to schedule f/u DEXA as may be overdue.

## 2020-12-14 NOTE — Assessment & Plan Note (Signed)
Chronic, stable. Continue nebivolol  Consider ACEI/ARB.

## 2020-12-14 NOTE — Assessment & Plan Note (Signed)
Continue endo f/u Cruzita Lederer)

## 2020-12-14 NOTE — Assessment & Plan Note (Signed)
Overall restorative sleep however poor sleep hygiene. Handout provided

## 2020-12-14 NOTE — Assessment & Plan Note (Signed)
Weight gain noted.

## 2020-12-14 NOTE — Assessment & Plan Note (Signed)
Recent CBC WNL.

## 2021-01-10 DIAGNOSIS — Z1231 Encounter for screening mammogram for malignant neoplasm of breast: Secondary | ICD-10-CM | POA: Diagnosis not present

## 2021-01-25 ENCOUNTER — Telehealth: Payer: Self-pay | Admitting: Family Medicine

## 2021-01-25 MED ORDER — NEBIVOLOL HCL 2.5 MG PO TABS: ORAL_TABLET | ORAL | 3 refills | Status: DC

## 2021-01-25 NOTE — Telephone Encounter (Signed)
  LAST APPOINTMENT DATE: 12/11/2020   NEXT APPOINTMENT DATE:@5 /13/2022  MEDICATION: Nebivolol  PHARMACY: [facility]. [place]  Let patient know to contact pharmacy at the end of the day to make sure medication is ready.  Please notify patient to allow 48-72 hours to process  Encourage patient to contact the pharmacy for refills or they can request refills through [place]:   LAST REFILL:  QTY:  REFILL DATE:    OTHER COMMENTS:    Okay for refill?  Please advise

## 2021-01-25 NOTE — Telephone Encounter (Signed)
E-scribed refill to [facility], [place].

## 2021-01-25 NOTE — Addendum Note (Signed)
Addended by: Brenton Grills on: 04/11/1833 37:35 AM   Modules accepted: Orders

## 2021-02-20 DIAGNOSIS — D1801 Hemangioma of skin and subcutaneous tissue: Secondary | ICD-10-CM | POA: Diagnosis not present

## 2021-02-20 DIAGNOSIS — L718 Other rosacea: Secondary | ICD-10-CM | POA: Diagnosis not present

## 2021-02-20 DIAGNOSIS — L821 Other seborrheic keratosis: Secondary | ICD-10-CM | POA: Diagnosis not present

## 2021-03-07 ENCOUNTER — Other Ambulatory Visit: Payer: Self-pay | Admitting: Family Medicine

## 2021-03-07 DIAGNOSIS — E785 Hyperlipidemia, unspecified: Secondary | ICD-10-CM

## 2021-03-07 DIAGNOSIS — R7303 Prediabetes: Secondary | ICD-10-CM

## 2021-03-08 ENCOUNTER — Telehealth: Payer: Medicare PPO | Admitting: Physician Assistant

## 2021-03-08 ENCOUNTER — Other Ambulatory Visit (HOSPITAL_BASED_OUTPATIENT_CLINIC_OR_DEPARTMENT_OTHER): Payer: Self-pay

## 2021-03-08 ENCOUNTER — Ambulatory Visit: Payer: Medicare PPO

## 2021-03-08 DIAGNOSIS — B9689 Other specified bacterial agents as the cause of diseases classified elsewhere: Secondary | ICD-10-CM

## 2021-03-08 DIAGNOSIS — J208 Acute bronchitis due to other specified organisms: Secondary | ICD-10-CM

## 2021-03-08 MED ORDER — BENZONATATE 100 MG PO CAPS: 100.0000 mg | ORAL_CAPSULE | Freq: Three times a day (TID) | ORAL | 0 refills | Status: DC | PRN

## 2021-03-08 MED ORDER — DOXYCYCLINE HYCLATE 100 MG PO TABS: 100.0000 mg | ORAL_TABLET | Freq: Two times a day (BID) | ORAL | 0 refills | Status: DC

## 2021-03-08 NOTE — Progress Notes (Signed)

## 2021-03-08 NOTE — Progress Notes (Signed)
I have spent 5 minutes in review of e-visit questionnaire, review and updating patient chart, medical decision making and response to patient.   Anas Reister Cody Rayshaun Needle, PA-C    

## 2021-03-09 ENCOUNTER — Other Ambulatory Visit: Payer: Medicare PPO

## 2021-03-14 DIAGNOSIS — I10 Essential (primary) hypertension: Secondary | ICD-10-CM | POA: Diagnosis not present

## 2021-03-14 DIAGNOSIS — E039 Hypothyroidism, unspecified: Secondary | ICD-10-CM | POA: Diagnosis not present

## 2021-03-14 DIAGNOSIS — Z124 Encounter for screening for malignant neoplasm of cervix: Secondary | ICD-10-CM | POA: Diagnosis not present

## 2021-03-14 DIAGNOSIS — Z01411 Encounter for gynecological examination (general) (routine) with abnormal findings: Secondary | ICD-10-CM | POA: Diagnosis not present

## 2021-03-14 DIAGNOSIS — L649 Androgenic alopecia, unspecified: Secondary | ICD-10-CM | POA: Diagnosis not present

## 2021-03-14 DIAGNOSIS — Z6835 Body mass index (BMI) 35.0-35.9, adult: Secondary | ICD-10-CM | POA: Diagnosis not present

## 2021-03-14 DIAGNOSIS — R947 Abnormal results of other endocrine function studies: Secondary | ICD-10-CM | POA: Diagnosis not present

## 2021-03-14 DIAGNOSIS — M8580 Other specified disorders of bone density and structure, unspecified site: Secondary | ICD-10-CM | POA: Diagnosis not present

## 2021-03-14 DIAGNOSIS — Z01419 Encounter for gynecological examination (general) (routine) without abnormal findings: Secondary | ICD-10-CM | POA: Diagnosis not present

## 2021-03-15 ENCOUNTER — Ambulatory Visit: Payer: Medicare PPO | Attending: Internal Medicine

## 2021-03-15 DIAGNOSIS — Z23 Encounter for immunization: Secondary | ICD-10-CM

## 2021-03-15 NOTE — Progress Notes (Signed)
   Covid-19 Vaccination Clinic  Name:  Cynthia Roberson    MRN: [id number] DOB: Aug 26, 1951  03/15/2021  [name] was observed post Covid-19 immunization for 15 minutes without incident. She was provided with Vaccine Information Sheet and instruction to access the V-Safe system.   [name] was instructed to call 911 with any severe reactions post vaccine: [name] Difficulty breathing  . Swelling of face and throat  . A fast heartbeat  . A bad rash all over body  . Dizziness and weakness   Immunizations Administered    Name Date Dose VIS Date Route   PFIZER Comrnaty(Gray TOP) Covid-19 Vaccine 03/15/2021 11:00 AM 0.3 mL 10/05/2020 Intramuscular   Manufacturer: [employer]   Lot: [id number]   NDC: [phone]

## 2021-03-16 ENCOUNTER — Other Ambulatory Visit (HOSPITAL_BASED_OUTPATIENT_CLINIC_OR_DEPARTMENT_OTHER): Payer: Self-pay

## 2021-03-16 MED ORDER — PFIZER-BIONT COVID-19 VAC-TRIS 30 MCG/0.3ML IM SUSP
INTRAMUSCULAR | 0 refills | Status: DC
  Filled 2021-03-16: qty 0.3, 1d supply, fill #0

## 2021-04-08 ENCOUNTER — Other Ambulatory Visit: Payer: Self-pay

## 2021-04-08 ENCOUNTER — Encounter (HOSPITAL_COMMUNITY): Payer: Self-pay

## 2021-04-08 ENCOUNTER — Emergency Department (EMERGENCY_DEPARTMENT_HOSPITAL): Payer: Medicare PPO

## 2021-04-08 ENCOUNTER — Emergency Department
Admission: EM | Admit: 2021-04-08 | Discharge: 2021-04-08 | Disposition: A | Discharge status: Home / Self Care | Payer: Medicare PPO | Attending: Emergency Medicine | Admitting: Emergency Medicine

## 2021-04-08 DIAGNOSIS — J40 Bronchitis, not specified as acute or chronic: Secondary | ICD-10-CM | POA: Diagnosis not present

## 2021-04-08 DIAGNOSIS — R918 Other nonspecific abnormal finding of lung field: Secondary | ICD-10-CM | POA: Diagnosis not present

## 2021-04-08 DIAGNOSIS — Z20822 Contact with and (suspected) exposure to covid-19: Secondary | ICD-10-CM | POA: Diagnosis not present

## 2021-04-08 HISTORY — DX: Disorder of thyroid, unspecified: E07.9

## 2021-04-08 HISTORY — DX: Essential (primary) hypertension: I10

## 2021-04-08 LAB — RAPID FLU A & B,  RSV, SARS-COV-2 PCR
COVID-19 Coronavirus Qual PCR Result: NOT DETECTED
Rapid Influenza A PCR Result: NOT DETECTED
Rapid Influenza B PCR Result: NOT DETECTED
Rapid RSV PCR Result: NOT DETECTED

## 2021-04-08 MED ORDER — DOXYCYCLINE HYCLATE 100 MG OR TABS
100.0000 mg | ORAL_TABLET | Freq: Two times a day (BID) | ORAL | 0 refills | Status: AC
Start: 2021-04-08 — End: 2021-04-15

## 2021-04-08 NOTE — ED Provider Notes (Signed)
CHIEF COMPLAINT   No chief complaint on file.           HISTORY OF PRESENT ILLNESS   HPI   70 year old female with hypertension presenting to the emergency department with 5-day history of cough.  Patient states that symptoms started while she was on the plane from [place] to [place] with a layover in [place].  She thought perhaps there was a strong perfume or something in the cabin ear that was irritating her.  Shortly after landing in [place], she and her husband began to cough.  Patient states that the cough is persisted and is only better today because she was taking cough medication.  She recently had bronchitis 1 month ago that improved after taking a course of doxycycline.  She denies chest pain other than right rib pain with coughing.  No dyspnea.  No lower extremity edema or pain.  No fever.  Denies sore throat, congestion, body aches.      History provided by:  Patient  Language interpreter used: No              PAST MEDICAL AND SURGICAL HISTORY   Past Medical History:   Diagnosis Date    Hypertension     Thyroid disease             SOCIAL HISTORY    Visiting [place] from [place].  Here with husband.     PAST FAMILY HISTORY          ALLERGIES   Review of patient's allergies indicates:  Allergies   Allergen Reactions    Amoxicillin Skin: Rash    Statins Palpitations    Sulfa Antibiotics Skin: Rash          REVIEW OF SYSTEMS   Review of Systems   Constitutional: Negative for fever and chills.   HENT: Negative for congestion and sore throat.    Eyes: Negative for pain and visual disturbance.   Respiratory: Positive for cough. Negative for shortness of breath.    Cardiovascular: Negative for extremity edema, chest pain and palpitations.   Gastrointestinal: Negative for abdominal pain, vomiting and diarrhea.   Genitourinary: Negative for dysuria and hematuria.   Musculoskeletal: Negative for back pain, myalgias and arthralgias.   Skin: Negative for rash and color change.   Neurological:  Negative for headaches, weakness, numbness, seizures and syncope.   All other systems reviewed and are negative.             PHYSICAL EXAM   ED VITALS:     Vitals (Arrival)      T: 37.4 C (04/08/21 1005)  BP: (!) 157/93 (04/08/21 0953)  HR: 81 (04/08/21 0953)  RR: 18 (04/08/21 0953)  SpO2: 96 % (04/08/21 0953) Room air   Vitals (Most recent in last 24 hrs)   T: 37.4 C (04/08/21 1005)  BP: (!) 146/94 (04/08/21 1005)  HR: 82 (04/08/21 1005)  RR: 17 (04/08/21 1005)  SpO2: 96 % (04/08/21 1005) Room air  T range: Temp  Min: 37.4 C  Max: 37.4 C  Wt 194 lb 0.1 oz (88 kg)     (no height taken for this visit)     There is no height or weight on file to calculate BMI.       Physical Exam  Vitals and nursing note reviewed.   Constitutional:       General: She is not in acute distress.     Appearance: Normal appearance. She is well-developed. She is not  ill-appearing or toxic-appearing.   HENT:      Head: Normocephalic and atraumatic.      Right Ear: External ear normal.      Left Ear: External ear normal.      Nose: Nose normal.      Mouth/Throat:      Mouth: Mucous membranes are moist.      Pharynx: Oropharynx is clear.   Eyes:      Extraocular Movements: Extraocular movements intact.      Conjunctiva/sclera: Conjunctivae normal.      Pupils: Pupils are equal, round, and reactive to light.   Neck:      Musculoskeletal: Neck supple.   Cardiovascular:      Rate and Rhythm: Normal rate and regular rhythm.      Pulses: Normal pulses.   Pulmonary:      Effort: Pulmonary effort is normal. No respiratory distress.      Breath sounds: Normal breath sounds. No wheezing or rhonchi.   Abdominal:      Palpations: Abdomen is soft.      Tenderness: There is no abdominal tenderness.   Musculoskeletal:         General: No swelling or tenderness. Normal range of motion.      Cervical back: Neck supple.      Right lower leg: No edema.      Left lower leg: No edema.   Skin:     General: Skin is warm and dry.      Capillary Refill: Capillary  refill takes less than 2 seconds.   Neurological:      General: No focal deficit present.      Mental Status: She is alert.           LABORATORY:   Labs Reviewed   RAPID FLU A & B, RSV, SARS-COV-2 PCR       Result Value    Rapid Flu A,B,RSV,SARS-CoV-2 PCR Nasal swab      Rapid Influenza A PCR Result None detected      Rapid Influenza B PCR Result None detected      Rapid RSV PCR Result None detected      COVID-19 Coronavirus Qual PCR Result None detected      COVID-19 Coronavirus Qual PCR Interpretation        Value: This is a negative result. Laboratory testing alone cannot rule out infection, particularly in the presence of clinical risk factors such as symptoms or exposure history.    COVID-19 Qualitative PCR Indication Symptomatic           IMAGING:     ED Wet Read -   XR Chest 1 View   Final Result         Lungs: Lung volumes are low and there is streaky opacity at the bases, likely atelectasis. No other focal consolidation.      Pleura: No effusion. No pneumothorax.      Heart and mediastinum: Borderline for size. No pulmonary edema..      Bones: No acute or suspicious abnormality.             Radiology Final Result -   No image results found.                ED COURSE/MEDICAL DECISION MAKING          Patient presenting to the emergency department with 5-day history of cough, with symptoms starting while on a plane from [place] to [place].  Denies fevers.  No dyspnea.  Differential includes viral, bronchitis, pneumonia, allergy, reactive airway disease.  Exam benign.  Lungs clear, no respiratory distress.    Labs influenza/COVID/RSV negative.  Chest x-ray lung volumes are low and there is streaky opacity at the bases, likely atelectasis.  No other focal consolidation.    Results were discussed with the patient.  She reports feeling very similar to when she had bronchitis requiring antibiotics 1 month ago.  She is due to fly back to [facility].  We will do a course of doxycycline.  Close  follow-up with primary care physician upon returning home for a recheck.  Discussed danger signs to watch for that warrant immediate return to the ED.  Questions answered.  Patient discharged home in stable condition.                     ADDITIONAL INFORMATION REVIEWED  - ATTENDING ONLY           Estill Dooms, MD  04/11/21 2246

## 2021-04-08 NOTE — ED Triage Notes (Signed)
Bacterial bronchitis on May 12, cough returned on wednesday after travelling from [place]. Negative COVID yesterday

## 2021-04-12 ENCOUNTER — Telehealth: Payer: Self-pay
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
## 2021-04-13 ENCOUNTER — Other Ambulatory Visit: Payer: Self-pay

## 2021-04-13 ENCOUNTER — Encounter: Payer: Self-pay | Admitting: Family Medicine

## 2021-04-13 ENCOUNTER — Telehealth: Payer: Self-pay | Admitting: *Deleted

## 2021-04-13 ENCOUNTER — Ambulatory Visit: Payer: Medicare PPO | Admitting: Family Medicine

## 2021-04-13 DIAGNOSIS — J209 Acute bronchitis, unspecified: Secondary | ICD-10-CM | POA: Diagnosis not present

## 2021-04-13 MED ORDER — PREDNISONE 20 MG PO TABS: ORAL_TABLET | ORAL | 0 refills | Status: DC

## 2021-04-13 NOTE — Telephone Encounter (Signed)
Noted. Can we offer appt tomorrow at 9am if not seen at [facility]?

## 2021-04-13 NOTE — Telephone Encounter (Signed)
Has only a few more days of abx so should be ok assuming no fever.  However would want cough to be better. Hopefully prednisone kicks in over next 24 hours.  Would give 1-2 days more at home.

## 2021-04-13 NOTE — Telephone Encounter (Signed)
Spoke with pt asking if she has been seen at [facility]  States she has not.  Offered 9:00 OV today.  Pt accepted and is scheduled.

## 2021-04-13 NOTE — Telephone Encounter (Signed)
Noted. Thanks.

## 2021-04-13 NOTE — Assessment & Plan Note (Signed)
Story/exam consistent with this. She is already on doxycycline course through recent ER visit. Will add short prednisone taper with steroid precautions in prediabetes history. Continue tussinDM PRN cough. Pt declines stronger cough medicine. Update if not improving with treatment.

## 2021-04-13 NOTE — Progress Notes (Signed)
Patient ID: Cynthia Roberson, female    DOB: [birth year], 70 y.o.   MRN: [id number]  This visit was conducted in person.  BP 120/80   Pulse 68   Temp (!) 97.5 F (36.4 C) (Temporal)   Ht $R'5\' 1"'Xa$  (1.549 m)   Wt 192 lb 3 oz (87.2 kg)   SpO2 96%   BMI 36.31 kg/m    CC: cough and wheezing Subjective:   HPI: Cynthia Roberson is a 70 y.o. female presenting on 04/13/2021 for Cough (C/o persistent cough and wheezing. )   See recent phone note.  Seen at [facility] ER 6/12, note reviewed. Cough started 6/7 on plane ride to [place] she initially attributed to exposure to strong perfume on plane. Tested negative for influenza, RSV, COVID PCR. CXR showed streaky opacities at bases, likely atx. Treated for recurrent bronchitis with doxycycline 7d  course. ?[place] allergy. Bad coughing fits, trouble sleeping at night due to cough. Wheezing worse when laying down. She has also taken several negative home COVID tests.  No h/o asthma. ?1 episode of environmental asthma to ?[place].  Non smoker Treating with diabetic tussin.  No headache, fever, ear or tooth pain, body aches, significant dyspnea, abd pain, nausea, diarrhea.   Treated virtually for bronchitis on 5/12 with doxy course and tessalon perls with poor response. Cough never fully recovered.   Husband also recently sick, seen at [facility] this week and treated with prednisone and abx.      Relevant past medical, surgical, family and social history reviewed and updated as indicated. Interim medical history since our last visit reviewed. Allergies and medications reviewed and updated. Outpatient Medications Prior to Visit  Medication Sig Dispense Refill   Accu-Chek Softclix Lancets lancets Use once a day 100 each 3   benzonatate (TESSALON) 100 MG capsule Take 1 capsule (100 mg total) by mouth 3 (three) times daily as needed for cough. 30 capsule 0   Biotin 5 MG TABS Take 5 mg by mouth daily.      Blood Glucose Monitoring Suppl (ACCU-CHEK  GUIDE ME) w/Device KIT Use as advised 1 kit 1   Cholecalciferol (VITAMIN D-3) 125 MCG (5000 UT) TABS Take 1 tablet by mouth daily. 30 tablet    COVID-19 mRNA Vac-TriS, Pfizer, (PFIZER-BIONT COVID-19 VAC-TRIS) SUSP injection Inject into the muscle. 0.3 mL 0   cycloSPORINE (RESTASIS) 0.05 % ophthalmic emulsion Place 1 drop into both eyes 2 (two) times daily.     doxycycline (VIBRA-TABS) 100 MG tablet Take 1 tablet (100 mg total) by mouth 2 (two) times daily. 14 tablet 0   Famotidine (PEPCID PO) Take by mouth as needed. OTC     fluticasone (FLONASE) 50 MCG/ACT nasal spray Place 2 sprays into both nostrils daily.     glucose blood (ACCU-CHEK GUIDE) test strip Use once a day, rotating check times 100 each 3   Lactase (LACTAID PO) Take by mouth as needed.     loratadine (CLARITIN) 10 MG tablet Take 10 mg by mouth daily as needed.      Menaquinone-7 (VITAMIN K2 PO) Take by mouth daily.     nebivolol (BYSTOLIC) 2.5 MG tablet TAKE 1 TABLET BY MOUTH DAILY 90 tablet 3   NONFORMULARY OR COMPOUNDED ITEM 2 capsules 2 (two) times daily. Hydro-eye     Probiotic Product (PROBIOTIC PO) Take by mouth daily.     Saw Palmetto, Serenoa repens, (SAW PALMETTO PO) Take by mouth daily.     SYNTHROID 50 MCG tablet TAKE 1  TABLET BY MOUTH EVERY MORNING BEFORE BREAKFAST 90 tablet 3   Turmeric Curcumin 500 MG CAPS Take 1 capsule by mouth daily.     vitamin B-12 (CYANOCOBALAMIN) 500 MCG tablet Take 500 mcg by mouth daily.     vitamin E 400 UNIT capsule Take 400 Units by mouth daily.     Zinc 50 MG TABS Take 1 tablet by mouth daily.     Minoxidil 5 % FOAM Apply topically.     No facility-administered medications prior to visit.     Per HPI unless specifically indicated in ROS section below Review of Systems Objective:  BP 120/80   Pulse 68   Temp (!) 97.5 F (36.4 C) (Temporal)   Ht $R'5\' 1"'Pm$  (1.549 m)   Wt 192 lb 3 oz (87.2 kg)   SpO2 96%   BMI 36.31 kg/m   Wt Readings from Last 3 Encounters:  04/13/21 192 lb 3 oz  (87.2 kg)  12/11/20 188 lb 2 oz (85.3 kg)  08/31/20 184 lb 9.6 oz (83.7 kg)      Physical Exam Vitals and nursing note reviewed.  Constitutional:      Appearance: Normal appearance. She is not ill-appearing.     Comments: Tired appearing  Cardiovascular:     Rate and Rhythm: Normal rate and regular rhythm.     Pulses: Normal pulses.     Heart sounds: Normal heart sounds. No murmur heard. Pulmonary:     Effort: Pulmonary effort is normal. No respiratory distress.     Breath sounds: Rhonchi present. No wheezing or rales.     Comments: Diffuse rhonchi with harsh deep hoarse coughing fits present  Skin:    General: Skin is warm and dry.     Findings: No rash.  Neurological:     Mental Status: She is alert.      Results for orders placed or performed in visit on 12/11/20  POCT glycosylated hemoglobin (Hb A1C)  Result Value Ref Range   Hemoglobin A1C 5.8 (A) 4.0 - 5.6 %   HbA1c POC (<> result, manual entry)     HbA1c, POC (prediabetic range)     HbA1c, POC (controlled diabetic range)     Assessment & Plan:  This visit occurred during the SARS-CoV-2 public health emergency.  Safety protocols were in place, including screening questions prior to the visit, additional usage of staff PPE, and extensive cleaning of exam room while observing appropriate contact time as indicated for disinfecting solutions.   Problem List Items Addressed This Visit     Acute bronchitis    Story/exam consistent with this. She is already on doxycycline course through recent ER visit. Will add short prednisone taper with steroid precautions in prediabetes history. Continue tussinDM PRN cough. Pt declines stronger cough medicine. Update if not improving with treatment.          Meds ordered this encounter  Medications   predniSONE (DELTASONE) 20 MG tablet    Sig: Take two tablets daily for 3 days followed by one tablet daily for 4 days    Dispense:  10 tablet    Refill:  0    No orders of the  defined types were placed in this encounter.   Patient Instructions  You do have bronchitis.  Finish doxycycline course.  Add prednisone taper.  Push fluids and rest.  Continue tussin cough suppressant.  Let us know if not improving with this.    Follow up plan: Return if symptoms worsen or fail to  improve.  Ria Bush, MD

## 2021-04-13 NOTE — Telephone Encounter (Signed)
Spoke with pt relaying Dr. G's message.  Pt verbalizes understanding and expresses her thanks.  

## 2021-04-13 NOTE — Telephone Encounter (Signed)
Patient left a voicemail stating that she was seen this morning. Patient stated that she has started taking the Prednisone. Patient stated that her family is going to be at the beach for the week and she wants to know when she should be able to go down and join them.

## 2021-04-13 NOTE — Patient Instructions (Signed)
You do have bronchitis.  Finish doxycycline course.  Add prednisone taper.  Push fluids and rest.  Continue tussin cough suppressant.  Let us know if not improving with this.

## 2021-04-16 NOTE — Telephone Encounter (Signed)
Pt called to make sure it was safe for her to go on to the beach. She said she is feeling much better!. Cough has improved "vastly". She will be leaving this afternoon.

## 2021-05-03 ENCOUNTER — Other Ambulatory Visit: Payer: Medicare PPO

## 2021-05-14 DIAGNOSIS — Z780 Asymptomatic menopausal state: Secondary | ICD-10-CM | POA: Diagnosis not present

## 2021-07-30 DIAGNOSIS — H02889 Meibomian gland dysfunction of unspecified eye, unspecified eyelid: Secondary | ICD-10-CM | POA: Diagnosis not present

## 2021-07-30 DIAGNOSIS — H5213 Myopia, bilateral: Secondary | ICD-10-CM | POA: Diagnosis not present

## 2021-07-30 DIAGNOSIS — H524 Presbyopia: Secondary | ICD-10-CM | POA: Diagnosis not present

## 2021-07-30 DIAGNOSIS — H43823 Vitreomacular adhesion, bilateral: Secondary | ICD-10-CM | POA: Diagnosis not present

## 2021-08-06 ENCOUNTER — Telehealth: Payer: Medicare PPO | Admitting: Physician Assistant

## 2021-08-06 DIAGNOSIS — J019 Acute sinusitis, unspecified: Secondary | ICD-10-CM

## 2021-08-06 DIAGNOSIS — B9689 Other specified bacterial agents as the cause of diseases classified elsewhere: Secondary | ICD-10-CM

## 2021-08-06 DIAGNOSIS — H66001 Acute suppurative otitis media without spontaneous rupture of ear drum, right ear: Secondary | ICD-10-CM | POA: Diagnosis not present

## 2021-08-06 MED ORDER — DOXYCYCLINE HYCLATE 100 MG PO TABS: 100.0000 mg | ORAL_TABLET | Freq: Two times a day (BID) | ORAL | 0 refills | Status: DC

## 2021-08-06 NOTE — Progress Notes (Signed)

## 2021-08-30 ENCOUNTER — Telehealth: Payer: Medicare PPO | Admitting: Family Medicine

## 2021-08-30 DIAGNOSIS — J069 Acute upper respiratory infection, unspecified: Secondary | ICD-10-CM

## 2021-08-30 NOTE — Progress Notes (Signed)
Cynthia Roberson  Needs in person eval given the on going symptoms  UC or PCP recommended

## 2021-08-31 ENCOUNTER — Encounter: Payer: Self-pay | Admitting: Family Medicine

## 2021-08-31 ENCOUNTER — Other Ambulatory Visit: Payer: Self-pay

## 2021-08-31 ENCOUNTER — Ambulatory Visit: Payer: Medicare PPO | Admitting: Internal Medicine

## 2021-08-31 ENCOUNTER — Ambulatory Visit: Payer: Medicare PPO | Admitting: Family Medicine

## 2021-08-31 VITALS — BP 134/82 | HR 75 | Temp 97.9°F | Ht 61.0 in | Wt 198.4 lb

## 2021-08-31 DIAGNOSIS — J019 Acute sinusitis, unspecified: Secondary | ICD-10-CM | POA: Insufficient documentation

## 2021-08-31 DIAGNOSIS — K089 Disorder of teeth and supporting structures, unspecified: Secondary | ICD-10-CM | POA: Insufficient documentation

## 2021-08-31 DIAGNOSIS — J0191 Acute recurrent sinusitis, unspecified: Secondary | ICD-10-CM | POA: Diagnosis not present

## 2021-08-31 MED ORDER — CEFDINIR 300 MG PO CAPS: 300.0000 mg | ORAL_CAPSULE | Freq: Two times a day (BID) | ORAL | 0 refills | Status: DC

## 2021-08-31 MED ORDER — CEFIXIME 400 MG PO CAPS: 400.0000 mg | ORAL_CAPSULE | Freq: Every day | ORAL | 0 refills | Status: DC
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
## 2021-08-31 NOTE — Patient Instructions (Signed)
For possible sinusitis - treat with Suprax 400mg  daily for 10 days. If not improved with above, let us know and we will refer you to ENT.  I'm glad you're seeing dentist next week as tooth issues are likely also contributing.  Let us know how you're doing.

## 2021-08-31 NOTE — Assessment & Plan Note (Addendum)
Recurrent sinus symptoms after doxycycline treatment last month, doesn't feel fully recovered. Will Rx suprax 400mg  daily x 10 days.  If recurrent, discussed ENT evaluation. Pt agrees with plan.   ADDENDUM ==> suprax not carried by pharmacy, they request alternative. Will send in omnicef in its place.

## 2021-08-31 NOTE — Addendum Note (Signed)
Addended by: Ria Bush on: 08/31/2021 04:41 PM   Modules accepted: Orders

## 2021-08-31 NOTE — Assessment & Plan Note (Addendum)
Sounds like she just had piece of filling fall out. Fortunately has upcoming dental appointment scheduled next week for further evaluation.

## 2021-09-07 ENCOUNTER — Ambulatory Visit: Payer: Medicare PPO | Admitting: Internal Medicine

## 2021-09-07 ENCOUNTER — Other Ambulatory Visit: Payer: Self-pay

## 2021-09-07 ENCOUNTER — Encounter: Payer: Self-pay | Admitting: Internal Medicine

## 2021-09-07 VITALS — BP 124/88 | HR 74 | Ht 61.0 in | Wt 196.6 lb

## 2021-09-07 DIAGNOSIS — E875 Hyperkalemia: Secondary | ICD-10-CM

## 2021-09-07 DIAGNOSIS — R7303 Prediabetes: Secondary | ICD-10-CM | POA: Diagnosis not present

## 2021-09-07 DIAGNOSIS — E281 Androgen excess: Secondary | ICD-10-CM

## 2021-09-07 DIAGNOSIS — E039 Hypothyroidism, unspecified: Secondary | ICD-10-CM | POA: Diagnosis not present

## 2021-09-07 LAB — HEMOGLOBIN A1C: Hgb A1c MFr Bld: 6.4 % (ref 4.6–6.5)

## 2021-09-07 LAB — BASIC METABOLIC PANEL
BUN: 16 mg/dL (ref 6–23)
CO2: 25 mEq/L (ref 19–32)
Calcium: 9.7 mg/dL (ref 8.4–10.5)
Chloride: 104 mEq/L (ref 96–112)
Creatinine, Ser: 1 mg/dL (ref 0.40–1.20)
GFR: 56.69 mL/min — ABNORMAL LOW (ref >60.00)
Glucose, Bld: 101 mg/dL — ABNORMAL HIGH (ref 70–99)
Potassium: 4 mEq/L (ref 3.5–5.1)
Sodium: 138 mEq/L (ref 135–145)

## 2021-09-07 LAB — T4, FREE: Free T4: 1.18 ng/dL (ref 0.60–1.60)

## 2021-09-07 LAB — TSH: TSH: 2.25 u[IU]/mL (ref 0.35–5.50)

## 2021-09-07 NOTE — Progress Notes (Signed)
Patient ID: Cynthia Roberson, female   DOB: Apr 20, 1951, 70 y.o.   DOB: Apr 20, 1951, 70 y.o.   MRN: [id number]  This visit occurred during the SARS-CoV-2 public health emergency.  Safety protocols were in place, including screening questions prior to the visit, additional usage of staff PPE, and extensive cleaning of exam room while observing appropriate contact time as indicated for disinfecting solutions.   HPI  Cynthia Roberson is a 70 y.o.-year-old female, returning for post menopausal hyperandrogenism, hypothyroidism, prediabetes. Last visit with me 1 year ago.  Interim history: She gained weight since last OV. Has numbness and tingling in L hand. She has carpal tunnel. Has bronchitis, sinus infection. Prepares to go to [facility] for her [place].  Reviewed history: Patient had a long history of hyperandrogenism (high testosterone level), and has a history of PCOS + infertility. She could not tolerate OCPs in the past (migraines).   Reviewed the records from Dr. Howell Rucks:  The patient has a history of scalp hair thinning since her younger years, for which she has been using prescription strength, Rogaine with good results. She is using it only once daily and hair thinning has slowed down. She also has noticed increased hair growth over her face, shoulders, chest, abdomen, and change in the texture of these hairs over past several years.  No acne.  She is postmenopausal since ~ age 39. She had a always had a history of irregular menses and had been on birth control in the past. She had difficulty with conception and had used Clomid. She has had 2 successful pregnancies in the past. Following this, the patient had regular menses for some time until she attained menopause.  No signs of masculinization.  Testosterone level - elevated at 130 in 2013.  Subsequent levels were still high, however, all of the levels below were drawn while on spironolactone. We stopped checking her testosterone levels after 03/2015,  when she was on spironolactone Component     Latest Ref Rng 07/06/2014 07/14/2014 10/11/2014 04/26/2015  Testosterone     3 - 41 ng/dL 173 (H)  97 (H)   Sex Hormone Binding     18 - 114 nmol/L 31     Testosterone Free     0.0 - 4.2 pg/mL 33.8 (H)  6.1 (H)   Testosterone-% Free     0.4 - 2.4 % 2.0     FSH      26.1     LH      20.3     DHEA-SO4     29.4 - 220.5 ug/dL  77.0    Testosterone, total     7.0 - 40.0 ng/dL    122.7 (H)   Reviewed previous work-up: - Ruled out for Cushing's syndrome based on 1 mg dex suppression test- morning cortisol is 1.47 (05/2013) - DHEAS 44.3, Prolactin 5.9 (05/2013) - CT Abdomen and pelvis with contrast (04/2013) - No adrenal mass or any ovarian abnormality - Ovarian US was negative (06/2014) At last visit with Dr.Phadke, the use of flutamide was discussed, as well as the possibility of oophorectomy.  She is also seeing Dr Ronita Hipps [facility]) and Dr Ubaldo Glassing (derm).  She has been on spironolactone since 05/2013 with mild elevation in her potassium levels so the dose of spironolactone was maintained low, 25 mg daily. In 2018 we decided to stop spironolactone. She felt better afterwards. She noticed significantly less hair on chin especially after she started walks every 3 weeks. Now she is not waxing anymore due to the coronavirus pandemic.  Last visit she felt that her hirsutism worsened. She started Spearmint tea in 07/2020 to lower testosterone. However, she was forgetting to drink it. Started saw palmetto - taking this for the last 6 mo >> scalp hair better  - stopped Rogain. Also, uses DHT shampoo. However, hair on face is worse after she started saw palmetto.  Testosterone remained slightly high: Component     Latest Ref Rng & Units 09/04/2020  Testosterone, Serum (Total)     ng/dL 109 (H)  % Free Testosterone     % 0.9  Free Testosterone, S     pg/mL 9.8 (H)  Sex Hormone Binding Globulin     nmol/L 43.2  Hemoglobin A1C     4.6 - 6.5 % 6.1  TSH      0.35 - 4.50 uIU/mL 2.30  T4,Free(Direct)     0.60 - 1.60 ng/dL 1.09  DHEA-Sulfate, LCMS     Ug/dL Reference Range:  Adult Females (61 - 70y): <128  68   Prev.: Component     Latest Ref Rng & Units 07/24/2018 09/01/2019  Testosterone, Serum (Total)     ng/dL 92 (H) 121 (H)  % Free Testosterone     % 1.2 1.2  Free Testosterone, S     pg/mL 11 (H) 15 (H)  Sex Hormone Binding Globulin     nmol/L 50.3 46.5   On spironolactone: Component     Latest Ref Rng & Units 07/24/2017  Testosterone     3 - 41 ng/dL 82 (H)  Testosterone Free     0.0 - 4.2 pg/mL 2.8  Sex Horm Binding Glob, Serum     17.3 - 125.0 nmol/L 50.8   Reviewed potassium levels: Lab Results  Component Value Date   K 4.7 12/06/2020   K 5.1 Hemolysis seen 11/17/2019   K 5.3 No hemolysis seen (H) 11/12/2018   K 4.7 11/07/2017   K 5.1 06/04/2017   She continues on minoxidil for scalp hair loss.  Hypothyroidism, well controlled - dx in 1995 -She continues on Synthroid d.a.w. 50 mcg daily, stable dose for the last 17 years -We tried to switch to generic levothyroxine but she could not tolerate it due to palpitations.  Pt takes Synthroid: - in am - fasting - at least 30 min from b'fast - no calcium - no iron - no multivitamins - no PPIs - not on Biotin  Latest TSH was normal: Lab Results  Component Value Date   TSH 2.30 09/04/2020   Prediabetes.  - She did not tolerate  regular metformin in the past >> she continues on Metformin ER 1000 mg daily with brunch  HbA1c levels are in the prediabetic range: Lab Results  Component Value Date   HGBA1C 5.8 (A) 12/11/2020   HGBA1C 6.1 09/04/2020   HGBA1C 5.9 11/17/2019   HGBA1C 5.5 09/01/2019   HGBA1C 6.0 07/24/2018   HGBA1C 5.9 11/07/2017   HGBA1C 5.8 07/24/2017   HGBA1C 5.9 02/21/2017   HGBA1C 5.9 07/25/2016   HGBA1C 6.1 01/23/2016   HGBA1C 6.0 02/09/2015   HGBA1C 6.3 10/11/2014   HGBA1C 6.2 07/06/2014   HGBA1C 5.7 01/11/2014   HGBA1C 5.6  06/10/2013   She started ASA 81 per Dr. Alvy Bimler >> bruising, petechiae >> stopped.  ROS: + see HPI Skin: no rashes, no hair loss, + excess hair on chin - also back of feet  I reviewed pt's medications, allergies, PMH, social hx, family hx, and changes were documented in the history of present  illness. Otherwise, unchanged from my initial visit note.  Past Medical History:  Diagnosis Date   Clostridium difficile infection 2013   Hirsutism    Hyperandrogenemia    Hypertension    Osteopenia    DEXA 2016   Other alopecia    Polycystic ovaries    Unspecified hypothyroidism    Past Surgical History:  Procedure Laterality Date   COLONOSCOPY  04/2015   TA, diverticulosis, rpt 5 yrs ([place])   COLONOSCOPY  2011   adenomatous polyp, rpt 5 yrs ([place])   TONSILLECTOMY     WISDOM TOOTH EXTRACTION     Social History   Social History   Marital Status: Married    Spouse Name: N/A   Number of Children: 2   Occupational History   Retired Pharmacist, hospital, now Counsellor   Social History Main Topics   Smoking status: Never Smoker    Smokeless tobacco: Not on file   Alcohol Use:     1-3 Glasses of wine per year   Drug Use: No   Sexual Activity: Yes   Current Outpatient Medications on File Prior to Visit  Medication Sig Dispense Refill   Accu-Chek Softclix Lancets lancets Use once a day 100 each 3   benzonatate (TESSALON) 100 MG capsule Take 1 capsule (100 mg total) by mouth 3 (three) times daily as needed for cough. 30 capsule 0   Biotin 5 MG TABS Take 5 mg by mouth daily.      Blood Glucose Monitoring Suppl (ACCU-CHEK GUIDE ME) w/Device KIT Use as advised 1 kit 1   cefdinir (OMNICEF) 300 MG capsule Take 1 capsule (300 mg total) by mouth 2 (two) times daily. 14 capsule 0   Cholecalciferol (VITAMIN D-3) 125 MCG (5000 UT) TABS Take 1 tablet by mouth daily. 30 tablet    COVID-19 mRNA Vac-TriS, Pfizer, (PFIZER-BIONT COVID-19 VAC-TRIS) SUSP injection Inject into the muscle. 0.3  mL 0   cycloSPORINE (RESTASIS) 0.05 % ophthalmic emulsion Place 1 drop into both eyes 2 (two) times daily.     Famotidine (PEPCID PO) Take by mouth as needed. OTC     fluticasone (FLONASE) 50 MCG/ACT nasal spray Place 2 sprays into both nostrils daily.     glucose blood (ACCU-CHEK GUIDE) test strip Use once a day, rotating check times 100 each 3   Lactase (LACTAID PO) Take by mouth as needed.     loratadine (CLARITIN) 10 MG tablet Take 10 mg by mouth daily as needed.      Menaquinone-7 (VITAMIN K2 PO) Take by mouth daily.     nebivolol (BYSTOLIC) 2.5 MG tablet TAKE 1 TABLET BY MOUTH DAILY 90 tablet 3   NONFORMULARY OR COMPOUNDED ITEM 2 capsules 2 (two) times daily. Hydro-eye     Probiotic Product (PROBIOTIC PO) Take by mouth daily.     Saw Palmetto, Serenoa repens, (SAW PALMETTO PO) Take by mouth daily.     SYNTHROID 50 MCG tablet TAKE 1 TABLET BY MOUTH EVERY MORNING BEFORE BREAKFAST 90 tablet 3   Turmeric Curcumin 500 MG CAPS Take 1 capsule by mouth daily.     vitamin B-12 (CYANOCOBALAMIN) 500 MCG tablet Take 500 mcg by mouth daily.     vitamin E 400 UNIT capsule Take 400 Units by mouth daily.     Zinc 50 MG TABS Take 1 tablet by mouth daily.     No current facility-administered medications on file prior to visit.   Allergies  Allergen Reactions   Amoxicillin Rash   Levothyroxine Palpitations  With generic levothyroxine, tolerates well the brand name.   Statins Palpitations    Tried Crestor and others   Sulfa Antibiotics Rash   Family History  Problem Relation Age of Onset   Heart failure Mother    Cancer Mother 82       lung   Heart disease Father    Stroke Father    Hypertension Father    PE: BP 124/88 (BP Location: Right Arm, Patient Position: Sitting, Cuff Size: Normal)   Pulse 74   Ht 5' 1"  (1.549 m)   Wt 196 lb 9.6 oz (89.2 kg)   SpO2 97%   BMI 37.15 kg/m  Wt Readings from Last 3 Encounters:  09/07/21 196 lb 9.6 oz (89.2 kg)  08/31/21 198 lb 6 oz (90 kg)   04/13/21 192 lb 3 oz (87.2 kg)   Constitutional: overweight, in NAD Eyes: PERRLA, EOMI, no exophthalmos ENT: moist mucous membranes, no thyromegaly, no cervical lymphadenopathy Cardiovascular: RRR, No MRG Respiratory: CTA B Gastrointestinal: abdomen soft, NT, ND, BS+ Musculoskeletal: no deformities, strength intact in all 4 Skin: moist, warm, no rashes, + female pattern baldness, hirsutism on chin Neurological: no tremor with outstretched hands, DTR normal in all 4  ASSESSMENT: 1. Postmenopausal hyperandrogenism - She discussed with Dr. Howell Rucks and Dr. Ronita Hipps before about the possibility of performing oophorectomy to look for ovarian hyperthecosis or small hilar ovarian tumors. She refused. We are following her clinically and biochemically.  2. Hypothyroidism  3. Prediabetes  PLAN:  1. Postmenopausal hyperandrogenism -likely due to pre-existing PCOS Patient with history of hyperandrogenism due to most likely previously undiagnosed PCOS or possibly secondary to insulin resistance or less likely).  Adrenal tumors, ovarian tumors, or ovarian hyperthecosis are less likely for her since these occurred with usually higher levels of testosterone, higher than 150 ng/mL.  Patient had repeated CT imaging of her adrenals, and also transvaginal ultrasound for evaluation of her ovaries and these tests were all negative for any masses or hyperplasia.  Her hormonal testing was also negative for hyperprolactinemia, pituitary dysfunction, Cushing syndrome. -She was previously on spironolactone but we stopped this 4 years ago and she felt better after stopping the medication.  She was waxing her chin but not during the coronavirus pandemic. -A testosterone level was high after stopping spironolactone and we repeated this at last visit and it was slightly better.  At that time she started spearmint tea in an effort to decrease her testosterone levels.  She was not consistent with the tea.  At this visit, she  tells me that she would like to retry spironolactone.  We will check a potassium level today and then start at a low dose, 25 mg daily and try to increase to 25 mg twice a day.  Afterwards we will check another potassium level in 5 to 7 days after the increase in dose. 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  At last visit, she felt that her hair loss worsened. -We did discuss in the past about proceeding with bilateral oophorectomy and at last visit she was planning to discuss with Dr. Ronita Hipps about it. At this point, plan is to continue w/o oophorectomy -She continues on metformin ER for prediabetes and feels that this is also helping with her hirsutism -I will see her back in 1 year  2. Hypothyroidism - latest thyroid labs reviewed with pt. >> normal: Lab Results  Component Value Date   TSH 2.30 09/04/2020  -  she continues on Synthroid 50 mcg daily - pt feels good on this dose. - we discussed about taking the thyroid hormone every day, with water, >30 minutes before breakfast, separated by >4 hours from acid reflux medications, calcium, iron, multivitamins. Pt. is taking it correctly. - will check thyroid tests today: TSH and fT4 - If labs are abnormal, she will need to return for repeat TFTs in 1.5 months  3. Prediabetes -She continues metformin ER 1000 mg daily.  No GI side effects from this. -At last check, in 11/2020 HbA1c was slightly lower, at 5.8% -We will repeat her hemoglobin A1c today -At last visit she was mentioning that she is craving chocolate.  At that time she stopped walking on the treadmill but was planning to restart.  Also, she has a [employer] at home.  Needs Spironolactone Rx.  Component     Latest Ref Rng & Units 09/07/2021  Sodium     135 - 145 mEq/L 138  Potassium     3.5 - 5.1 mEq/L 4.0  Chloride     96 - 112 mEq/L 104  CO2     19 - 32 mEq/L 25  Glucose     70 - 99 mg/dL 101 (H)  BUN     6 - 23 mg/dL 16   Creatinine     0.40 - 1.20 mg/dL 1.00  Calcium     8.4 - 10.5 mg/dL 9.7  GFR     >60.00 mL/min 56.69 (L)  Testosterone, Serum (Total)     ng/dL 175 (H)  % Free Testosterone     % 0.8  Free Testosterone, S     pg/mL 14 (H)  Sex Hormone Binding Globulin     nmol/L 55.8  Hemoglobin A1C     4.6 - 6.5 % 6.4  TSH     0.35 - 5.50 uIU/mL 2.25  T4,Free(Direct)     0.60 - 1.60 ng/dL 1.18  TFTs are normal. Free testosterone level is higher.  Potassium normal.  We can start spironolactone as discussed.  Philemon Kingdom, MD PhD [facility]

## 2021-09-07 NOTE — Patient Instructions (Addendum)
Please stop at the lab.  Start Spironolactone 25 mg daily and in 1 week increase to 25 mg 2x a day. Come back for labs in 5-7 days after the increase in dose.  Please continue Synthroid 50 mcg daily.  Take the thyroid hormone every day, with water, at least 30 minutes before breakfast, separated by at least 4 hours from: - acid reflux medications - calcium - iron - multivitamins  Please continue Metformin ER 1000 mg daily.  Please come back for a follow-up appointment in 1 year.

## 2021-09-14 LAB — TESTOSTERONE, FREE AND TOTAL (INCLUDES SHBG)-(MALES)
% Free Testosterone: 0.8 %
Free Testosterone, S: 14 pg/mL — ABNORMAL HIGH
Sex Hormone Binding Globulin: 55.8 nmol/L
Testosterone, Serum (Total): 175 ng/dL — ABNORMAL HIGH

## 2021-09-14 MED ORDER — SPIRONOLACTONE 25 MG PO TABS: 25.0000 mg | ORAL_TABLET | Freq: Two times a day (BID) | ORAL | 3 refills | Status: DC

## 2021-09-18 ENCOUNTER — Other Ambulatory Visit: Payer: Self-pay | Admitting: Internal Medicine

## 2021-09-18 ENCOUNTER — Telehealth: Payer: Self-pay | Admitting: Family Medicine

## 2021-09-18 DIAGNOSIS — Z1211 Encounter for screening for malignant neoplasm of colon: Secondary | ICD-10-CM

## 2021-09-18 NOTE — Telephone Encounter (Signed)
Lvm asking pt to call.  Need to relay Dr. Synthia Innocent message and get answer to his question.

## 2021-09-18 NOTE — Telephone Encounter (Deleted)
M Confirm and document reason for call. If symptomatic, describe symptoms. ---Caller states she has had rectal bleeding and a change in stools. No blood in stools. Does the patient have any new or worsening symptoms? ---Yes Will a triage be completed? ---Yes Related visit to physician within the last 2 weeks? ---No Does the PT have any chronic conditions? (i.e. diabetes, asthma, this includes High risk factors for pregnancy, etc.) ---Yes List chronic conditions. ---hypothyroid;hypotension;pcos Is this a behavioral health or substance abuse call? ---No Guidelines Guideline Title Affirmed Question Affirmed Notes Nurse Date/Time (Eastern Time) Rectal Bleeding [1] MODERATE rectal bleeding (small blood clots, passing blood without stool, or toilet water turns red) AND [2] more than once a day Terence Lux, RN, Altha Harm 09/18/2021 10:31:18 AM Disp. Time Eilene Ghazi Time) Disposition Final User 09/18/2021 10:37:08 AM Go to ED Now Yes Terence Lux, RN, Christine PLEASE NOTE: All timestamps contained within this report are represented as [place] Standard Time. CONFIDENTIALTY NOTICE: This fax transmission is intended only for the addressee. It contains information that is legally privileged, confidential or otherwise protected from use or disclosure. If you are not the intended recipient, you are strictly prohibited from reviewing, disclosing, copying using or disseminating any of this information or taking any action in reliance on or regarding this information. If you have received this fax in error, please notify us immediately by telephone so that we can arrange for its return to [place]. Phone: [phone], Toll-Free: [phone], Fax: [phone] Page: 2 of 2 Call Id: [id number] Sylvester Disagree/Comply Comply Caller Understands Yes PreDisposition Call Doctor Care Advice Given Per Guideline GO TO ED NOW: * You need to be seen in the Emergency Department. * Go to the ED at ___________  [place] now. [address] carefully. CARE ADVICE given per Rectal Bleeding (Adult) guideline. Referrals GO TO FACILITY UNDECIDE

## 2021-09-18 NOTE — Telephone Encounter (Signed)
09/18/2021 10:37:08 AM Go to ED Now Yes Terence Lux, RN, Christine PLEASE NOTE: All timestamps contained within this report are represented as [place] Standard Time. CONFIDENTIALTY NOTICE: This fax transmission is intended only for the addressee. It contains information that is legally privileged, confidential or otherwise protected from use or disclosure. If you are not the intended recipient, you are strictly prohibited from reviewing, disclosing, copying using or disseminating any of this information or taking any action in reliance on or regarding this information. If you have received this fax in error, please notify us immediately by telephone so that we can arrange for its return to [place]. Phone: [phone], Toll-Free: [phone], Fax: [phone] Page: 2 of 2 Call Id: [id number] Jenera Disagree/Comply Comply Caller Understands Yes PreDisposition Call Doctor Care Advice Given Per Guideline GO TO ED NOW: * You need to be seen in the Emergency Department. * Go to the ED at ___________ [place] now. Drive carefully. CARE ADVICE given per Rectal Bleeding (Adult) guideline. Referrals GO TO FACILITY UNDECIDE   This is the end of the access nurse note; computer issue would not allow copy of entire note on one note under documentation.  I spoke with pt; pt said she is baby sitting today; if starts to bleeding again pt will go to ED 09/18/21 but if no bleeding today pt will go to ED at [facility] on 09/19/21. Pt said that her BM's look "weird"; pt has had some constipation while on vacation at [facility] last week. Returned home last night.pt had hard constipated BM on 09/15/21 and since then BM has not been a normal sized BM; pt said BM is smaller.saw no blood in BM last night or this morning.no abd pain and no fever.offered pt an appt at [facility] over on 09/19/21 but pt said she is going to [employer] she needs some testing done or a procedure done. Sending note to Dr Darnell Level and Lattie Haw  CMA and will teams lisa as well.

## 2021-09-18 NOTE — Telephone Encounter (Signed)
[place] [facility] - Client TELEPHONE ADVICE RECORD AccessNurse Patient Name: Cynthia Roberson Gender: Female DOB: 09/25/1951 Age: 70 Y 71 M 3 D Return Phone Number: [phone] (Primary) Address: City/ State/ Zip: [place]  [address] Client [place] [facility] - Client Client Site [place] Provider Ria Bush - MD Contact Type Call Who Is Calling Patient / Member / Family / Caregiver Call Type Triage / Clinical Relationship To Patient Self Return Phone Number [phone] (Primary) Chief Complaint Rectal Bleeding Reason for Call Symptomatic / Request for [place] states she has had rectal bleeding and a change in stools. No blood in stools. Translation No Nurse Assessment Nurse: Terence Lux, RN, Christine Date/Time (Eastern Time): 09/18/2021 10:26:41 AM Confirm and document reason for call. If symptomatic, describe symptoms. ---Caller states she has had rectal bleeding and a change in stools. No blood in stools. Does the patient have any new or worsening symptoms? ---Yes Will a triage be completed? ---Yes Related visit to physician within the last 2 weeks? ---No Does the PT have any chronic conditions? (i.e. diabetes, asthma, this includes High risk factors for pregnancy, etc.) ---Yes List chronic conditions. ---hypothyroid;hypotension;pcos Is this a behavioral health or substance abuse call? ---No Guidelines Guideline Title Affirmed Question Affirmed Notes Nurse Date/Time (Eastern Time) Rectal Bleeding [1] MODERATE rectal bleeding (small blood clots, passing blood without stool, or toilet water turns red) AND [2] more than once a day Terence Lux, RN, Altha Harm 09/18/2021 10:31:18

## 2021-09-18 NOTE — Telephone Encounter (Signed)
Noted. She recently completed 10d antibiotic course which could have changed bowels. Do recommend start taking probiotic to replenish healthy gut flora.  She is a bit overdue for colonoscopy.  Has she noted any hemorrhoids when wiping or other external cause of bleed?

## 2021-09-18 NOTE — Telephone Encounter (Signed)
PT returned call . Would like a call back [phone]

## 2021-09-19 NOTE — Telephone Encounter (Signed)
Pt returning your call

## 2021-09-19 NOTE — Telephone Encounter (Signed)
Lvm asking pt to call.  Need to relay Dr. Synthia Innocent message and get answer to his question.

## 2021-09-24 NOTE — Telephone Encounter (Signed)
Lvm asking pt to call.  Need to relay Dr. Synthia Innocent message and get answer to his question.   Also, sent MyChart message.

## 2021-09-26 NOTE — Addendum Note (Signed)
Addended by: Ria Bush on: 09/26/2021 04:59 PM   Modules accepted: Orders

## 2021-10-08 ENCOUNTER — Other Ambulatory Visit: Payer: Self-pay

## 2021-10-08 ENCOUNTER — Other Ambulatory Visit (INDEPENDENT_AMBULATORY_CARE_PROVIDER_SITE_OTHER): Payer: Medicare PPO

## 2021-10-08 DIAGNOSIS — E875 Hyperkalemia: Secondary | ICD-10-CM

## 2021-10-08 DIAGNOSIS — E281 Androgen excess: Secondary | ICD-10-CM

## 2021-10-08 DIAGNOSIS — E785 Hyperlipidemia, unspecified: Secondary | ICD-10-CM | POA: Diagnosis not present

## 2021-10-08 LAB — LIPID PANEL
Cholesterol: 190 mg/dL (ref 0–200)
HDL: 50.7 mg/dL
LDL Cholesterol: 113 mg/dL — ABNORMAL HIGH (ref 0–99)
NonHDL: 138.92
Total CHOL/HDL Ratio: 4
Triglycerides: 130 mg/dL (ref 0.0–149.0)
VLDL: 26 mg/dL (ref 0.0–40.0)

## 2021-10-08 LAB — BASIC METABOLIC PANEL
BUN: 15 mg/dL (ref 6–23)
CO2: 29 mEq/L (ref 19–32)
Calcium: 10.1 mg/dL (ref 8.4–10.5)
Chloride: 102 mEq/L (ref 96–112)
Creatinine, Ser: 1 mg/dL (ref 0.40–1.20)
GFR: 56.66 mL/min — ABNORMAL LOW (ref >60.00)
Glucose, Bld: 95 mg/dL (ref 70–99)
Potassium: 4.6 mEq/L (ref 3.5–5.1)
Sodium: 139 mEq/L (ref 135–145)

## 2021-10-15 ENCOUNTER — Encounter: Payer: Self-pay | Admitting: Gastroenterology

## 2021-11-14 ENCOUNTER — Ambulatory Visit: Payer: Medicare PPO | Admitting: Gastroenterology

## 2021-11-19 ENCOUNTER — Encounter: Payer: Medicare PPO | Admitting: Family Medicine

## 2021-12-20 ENCOUNTER — Ambulatory Visit: Payer: Medicare PPO | Admitting: Gastroenterology

## 2021-12-20 ENCOUNTER — Encounter: Payer: Self-pay | Admitting: Gastroenterology

## 2021-12-20 VITALS — BP 144/98 | HR 77 | Ht 62.0 in | Wt 195.2 lb

## 2021-12-20 DIAGNOSIS — K921 Melena: Secondary | ICD-10-CM | POA: Diagnosis not present

## 2021-12-20 DIAGNOSIS — Z86010 Personal history of colonic polyps: Secondary | ICD-10-CM | POA: Diagnosis not present

## 2021-12-20 MED ORDER — ONDANSETRON HCL 4 MG PO TABS: 4.0000 mg | ORAL_TABLET | ORAL | 0 refills | Status: DC

## 2021-12-20 MED ORDER — PLENVU 140 G PO SOLR: 1.0000 | Freq: Once | ORAL | 0 refills | Status: AC

## 2021-12-20 NOTE — Patient Instructions (Signed)
You have been scheduled for a colonoscopy. Please follow written instructions given to you at your visit today.  Please pick up your prep supplies at the pharmacy within the next 1-3 days. If you use inhalers (even only as needed), please bring them with you on the day of your procedure.  We have sent the following medications to your pharmacy for you to pick up at your convenience: Plenvu and zofran.   Due to recent changes in healthcare laws, you may see the results of your imaging and laboratory studies on MyChart before your provider has had a chance to review them.  We understand that in some cases there may be results that are confusing or concerning to you. Not all laboratory results come back in the same time frame and the provider may be waiting for multiple results in order to interpret others.  Please give [place] 48 hours in order for your provider to thoroughly review all the results before contacting the office for clarification of your results.   The [facility] providers would like to encourage you to use [facility] to communicate with providers for non-urgent requests or questions.  Due to long hold times on the telephone, sending your provider a message by [facility] may be a faster and more efficient way to get a response.  Please allow 48 business hours for a response.  Please remember that this is for non-urgent requests.   Thank you for choosing me and [facility].  Pricilla Riffle. Dagoberto Ligas., MD., Marval Regal

## 2021-12-20 NOTE — Progress Notes (Signed)
History of Present Illness: This is a 71 year old female referred by Ria Bush, MD for the evaluation of a personal history of adenomatous colon polyps and rectal bleeding.  She noted several days of rectal bleeding in November that resolved spontaneously and she has not had recurrent bleeding since that time.  She has a history of adenomatous colon polyps and sessile serrated colon polyps.  Her last colonoscopy was performed by Dr. Paulita Fujita in July 2016 showing 2 small adenomatous polyps and mild sigmoid colon diverticulosis. Denies weight loss, abdominal pain, constipation, diarrhea, change in stool caliber, melena, nausea, vomiting, dysphagia, reflux symptoms, chest pain.     Allergies  Allergen Reactions   Amoxicillin Rash   Levothyroxine Palpitations    With generic levothyroxine, tolerates well the brand name.   Statins Palpitations    Tried Crestor and others   Sulfa Antibiotics Rash   Outpatient Medications Prior to Visit  Medication Sig Dispense Refill   Accu-Chek Softclix Lancets lancets Use once a day 100 each 3   Biotin 5 MG TABS Take 5 mg by mouth daily.      Blood Glucose Monitoring Suppl (ACCU-CHEK GUIDE ME) w/Device KIT Use as advised 1 kit 1   Cholecalciferol (VITAMIN D-3) 125 MCG (5000 UT) TABS Take 1 tablet by mouth daily. 30 tablet    cycloSPORINE (RESTASIS) 0.05 % ophthalmic emulsion Place 1 drop into both eyes 2 (two) times daily.     Famotidine (PEPCID PO) Take by mouth as needed. OTC     fluticasone (FLONASE) 50 MCG/ACT nasal spray Place 2 sprays into both nostrils daily.     glucose blood (ACCU-CHEK GUIDE) test strip Use once a day, rotating check times 100 each 3   Lactase (LACTAID PO) Take by mouth as needed.     loratadine (CLARITIN) 10 MG tablet Take 10 mg by mouth daily as needed.      Menaquinone-7 (VITAMIN K2 PO) Take by mouth daily.     nebivolol (BYSTOLIC) 2.5 MG tablet TAKE 1 TABLET BY MOUTH DAILY 90 tablet 3   NONFORMULARY OR COMPOUNDED ITEM  2 capsules 2 (two) times daily. Hydro-eye     Probiotic Product (PROBIOTIC PO) Take by mouth daily.     Saw Palmetto, Serenoa repens, (SAW PALMETTO PO) Take by mouth daily.     spironolactone (ALDACTONE) 25 MG tablet Take 1 tablet (25 mg total) by mouth 2 (two) times daily. 180 tablet 3   SYNTHROID 50 MCG tablet TAKE 1 TABLET BY MOUTH EVERY MORNING BEFORE BREAKFAST 90 tablet 3   vitamin B-12 (CYANOCOBALAMIN) 500 MCG tablet Take 500 mcg by mouth daily.     vitamin E 400 UNIT capsule Take 400 Units by mouth daily.     Zinc 50 MG TABS Take 1 tablet by mouth daily.     COVID-19 mRNA Vac-TriS, Pfizer, (PFIZER-BIONT COVID-19 VAC-TRIS) SUSP injection Inject into the muscle. 0.3 mL 0   benzonatate (TESSALON) 100 MG capsule Take 1 capsule (100 mg total) by mouth 3 (three) times daily as needed for cough. 30 capsule 0   cefdinir (OMNICEF) 300 MG capsule Take 1 capsule (300 mg total) by mouth 2 (two) times daily. 14 capsule 0   Turmeric Curcumin 500 MG CAPS Take 1 capsule by mouth daily.     No facility-administered medications prior to visit.   Past Medical History:  Diagnosis Date   Clostridium difficile infection 2013   Hirsutism    Hyperandrogenemia    Hypertension  Osteopenia   ° DEXA 2016  ° Other alopecia   ° Polycystic ovaries   ° Tubular adenoma of colon 05/25/2015  ° Unspecified hypothyroidism   ° °Past Surgical History:  °Procedure Laterality Date  ° COLONOSCOPY  04/2015  ° TA, diverticulosis, rpt 5 yrs (Outlaw)  ° COLONOSCOPY  2011  ° adenomatous polyp, rpt 5 yrs ([facility])  ° TONSILLECTOMY    ° WISDOM TOOTH EXTRACTION    ° °Social History  ° °Socioeconomic History  ° Marital status: Married  °  Spouse name: Mark  ° Number of children: Not on file  ° Years of education: Not on file  ° Highest education level: Not on file  °Occupational History  ° Not on file  °Tobacco Use  ° Smoking status: Never  ° Smokeless tobacco: Never  °Vaping Use  ° Vaping Use: Never used  °Substance and Sexual Activity   ° Alcohol use: Yes  °  Comment: occassionally  ° Drug use: No  ° Sexual activity: Yes  °Other Topics Concern  ° Not on file  °Social History Narrative  ° Lives with husband Mark  ° Occ: retired, was middle school media teacher   ° Activity: stays active caring for grandchildren  ° Diet: good water, fruits/vegetables daily   ° °Social Determinants of Health  ° °Financial Resource Strain: Not on file  °Food Insecurity: Not on file  °Transportation Needs: Not on file  °Physical Activity: Not on file  °Stress: Not on file  °Social Connections: Not on file  ° °Family History  °Problem Relation Age of Onset  ° Heart failure Mother   ° Cancer Mother 80  °     lung  ° Heart disease Father   ° Stroke Father   ° Hypertension Father   ° °   ° °Review of Systems: Pertinent positive and negative review of systems were noted in the above HPI section. All other review of systems were otherwise negative. ° ° ° °Physical Exam: °General: Well developed, well nourished, no acute distress °Head: Normocephalic and atraumatic °Eyes: Sclerae anicteric, EOMI °Ears: Normal auditory acuity °Mouth: Not examined, mask on during Covid-19 pandemic °Neck: Supple, no masses or thyromegaly °Lungs: Clear throughout to auscultation °Heart: Regular rate and rhythm; no murmurs, rubs or bruits °Abdomen: Soft, non tender and non distended. No masses, hepatosplenomegaly or hernias noted. Normal Bowel sounds °Rectal: Deferred to colonoscopy  °Musculoskeletal: Symmetrical with no gross deformities  °Skin: No lesions on visible extremities °Pulses:  Normal pulses noted °Extremities: No clubbing, cyanosis, edema or deformities noted °Neurological: Alert oriented x 4, grossly nonfocal °Cervical Nodes:  No significant cervical adenopathy °Inguinal Nodes: No significant inguinal adenopathy °Psychological:  Alert and cooperative. Normal mood and affect ° ° °Assessment and Recommendations: ° °Hematochezia, self-limited and resolved.  Rule out hemorrhoids,  colorectal neoplasms.  Personal history of adenomatous colon polyps and sessile serrated colon polyps.  Her last colonoscopy in 2016 had 2 small tubular adenomas.  Schedule colonoscopy. The risks (including bleeding, perforation, infection, missed lesions, medication reactions and possible hospitalization or surgery if complications occur), benefits, and alternatives to colonoscopy with possible biopsy and possible polypectomy were discussed with the patient and they consent to proceed.   ° ° °cc: Gutierrez, Javier, MD °[address] °[place],  [place] [address] °

## 2022-01-09 ENCOUNTER — Telehealth: Payer: Medicare PPO | Admitting: Physician Assistant

## 2022-01-09 DIAGNOSIS — J40 Bronchitis, not specified as acute or chronic: Secondary | ICD-10-CM | POA: Diagnosis not present

## 2022-01-09 MED ORDER — BENZONATATE 100 MG PO CAPS: 100.0000 mg | ORAL_CAPSULE | Freq: Three times a day (TID) | ORAL | 0 refills | Status: AC

## 2022-01-09 MED ORDER — PREDNISONE 50 MG PO TABS: 50.0000 mg | ORAL_TABLET | Freq: Every day | ORAL | 0 refills | Status: AC

## 2022-01-09 MED ORDER — DOXYCYCLINE HYCLATE 100 MG PO CAPS: 100.0000 mg | ORAL_CAPSULE | Freq: Two times a day (BID) | ORAL | 0 refills | Status: AC

## 2022-01-09 NOTE — Progress Notes (Signed)
We are sorry that you are not feeling well.  Here is how we plan to help! ? ?Based on your presentation I believe you most likely have A cough due to bacteria.  When patients have a fever and a productive cough with a change in color or increased sputum production, we are concerned about bacterial bronchitis.  If left untreated it can progress to pneumonia.  If your symptoms do not improve with your treatment plan it is important that you contact your provider.   I have prescribed Doxycycline 100 mg twice a day for 7 days   ?  ?In addition you may use A prescription cough medication called Tessalon Perles '100mg'$ . You may take 1-2 capsules every 8 hours as needed for your cough. ? ?I have also prescribed a short course of a steroid, prednisone for your wheezing.  ? ?I would recommend that if you are not currently taking anything for your allergies that you should start taking and over the counter allergy pill as well. ? ?From your responses in the eVisit questionnaire you describe inflammation in the upper respiratory tract which is causing a significant cough.  This is commonly called Bronchitis and has four common causes:   ?Allergies ?Viral Infections ?Acid Reflux ?Bacterial Infection ?Allergies, viruses and acid reflux are treated by controlling symptoms or eliminating the cause. An example might be a cough caused by taking certain blood pressure medications. You stop the cough by changing the medication. Another example might be a cough caused by acid reflux. Controlling the reflux helps control the cough. ? ?USE OF BRONCHODILATOR ("RESCUE") INHALERS: ?There is a risk from using your bronchodilator too frequently.  The risk is that over-reliance on a medication which only relaxes the muscles surrounding the breathing tubes can reduce the effectiveness of medications prescribed to reduce swelling and congestion of the tubes themselves.  Although you feel brief relief from the bronchodilator inhaler, your asthma  may actually be worsening with the tubes becoming more swollen and filled with mucus.  This can delay other crucial treatments, such as oral steroid medications. If you need to use a bronchodilator inhaler daily, several times per day, you should discuss this with your provider.  There are probably better treatments that could be used to keep your asthma under control.  ?   ?HOME CARE ?Only take medications as instructed by your medical team. ?Complete the entire course of an antibiotic. ?Drink plenty of fluids and get plenty of rest. ?Avoid close contacts especially the very young and the elderly ?Cover your mouth if you cough or cough into your sleeve. ?Always remember to wash your hands ?A steam or ultrasonic humidifier can help congestion.  ? ?GET HELP RIGHT AWAY IF: ?You develop worsening fever. ?You become short of breath ?You cough up blood. ?Your symptoms persist after you have completed your treatment plan ?MAKE SURE YOU  ?Understand these instructions. ?Will watch your condition. ?Will get help right away if you are not doing well or get worse. ?  ? ?Thank you for choosing an e-visit. ? ?Your e-visit answers were reviewed by a board certified advanced clinical practitioner to complete your personal care plan. Depending upon the condition, your plan could have included both over the counter or prescription medications. ? ?Please review your pharmacy choice. Make sure the pharmacy is open so you can pick up prescription now. If there is a problem, you may contact your provider through [employer] and have the prescription routed to another pharmacy.  Your  safety is important to [place]. If you have drug allergies check your prescription carefully.  ? ?For the next 24 hours you can use MyChart to ask questions about today's visit, request a non-urgent call back, or ask for a work or school excuse. ?You will get an email in the next two days asking about your experience. I hope that your e-visit has been  valuable and will speed your recovery. ? ?Approximately 5 minutes was spent documenting and reviewing patient's chart. ? ?

## 2022-01-14 ENCOUNTER — Other Ambulatory Visit: Payer: Self-pay

## 2022-01-14 MED ORDER — NEBIVOLOL HCL 2.5 MG PO TABS: ORAL_TABLET | ORAL | 3 refills | Status: DC

## 2022-01-15 DIAGNOSIS — Z1231 Encounter for screening mammogram for malignant neoplasm of breast: Secondary | ICD-10-CM | POA: Diagnosis not present

## 2022-01-15 LAB — HM MAMMOGRAPHY

## 2022-01-26 ENCOUNTER — Other Ambulatory Visit: Payer: Self-pay | Admitting: Family Medicine

## 2022-01-26 DIAGNOSIS — D751 Secondary polycythemia: Secondary | ICD-10-CM

## 2022-01-26 DIAGNOSIS — E785 Hyperlipidemia, unspecified: Secondary | ICD-10-CM

## 2022-01-26 DIAGNOSIS — E559 Vitamin D deficiency, unspecified: Secondary | ICD-10-CM

## 2022-01-26 DIAGNOSIS — E039 Hypothyroidism, unspecified: Secondary | ICD-10-CM

## 2022-01-26 DIAGNOSIS — I10 Essential (primary) hypertension: Secondary | ICD-10-CM

## 2022-01-26 HISTORY — PX: COLONOSCOPY: SHX174

## 2022-01-29 DIAGNOSIS — H04123 Dry eye syndrome of bilateral lacrimal glands: Secondary | ICD-10-CM | POA: Diagnosis not present

## 2022-01-29 DIAGNOSIS — H5213 Myopia, bilateral: Secondary | ICD-10-CM | POA: Diagnosis not present

## 2022-01-29 DIAGNOSIS — H02889 Meibomian gland dysfunction of unspecified eye, unspecified eyelid: Secondary | ICD-10-CM | POA: Diagnosis not present

## 2022-01-29 DIAGNOSIS — H524 Presbyopia: Secondary | ICD-10-CM | POA: Diagnosis not present

## 2022-01-29 DIAGNOSIS — H43823 Vitreomacular adhesion, bilateral: Secondary | ICD-10-CM | POA: Diagnosis not present

## 2022-01-30 ENCOUNTER — Ambulatory Visit (INDEPENDENT_AMBULATORY_CARE_PROVIDER_SITE_OTHER): Payer: Medicare PPO

## 2022-01-30 VITALS — Ht 62.0 in | Wt 195.0 lb

## 2022-01-30 DIAGNOSIS — Z0000 Encounter for general adult medical examination without abnormal findings: Secondary | ICD-10-CM | POA: Diagnosis not present

## 2022-01-30 NOTE — Progress Notes (Signed)
? ?Subjective:  ? Cynthia Roberson is a 71 y.o. female who presents for Medicare Annual (Subsequent) preventive examination. ?Virtual Visit via Telephone Note ? ?I connected with  Cynthia Roberson on 01/30/22 at 11:15 AM EDT by telephone and verified that I am speaking with the correct person using two identifiers. ? ?Location: ?Patient: HOME ?Provider: [facility] ?Persons participating in the virtual visit: patient/Nurse Health Advisor ?  ?I discussed the limitations, risks, security and privacy concerns of performing an evaluation and management service by telephone and the availability of in person appointments. The patient expressed understanding and agreed to proceed. ? ?Interactive audio and video telecommunications were attempted between this nurse and patient, however failed, due to patient having technical difficulties OR patient did not have access to video capability.  We continued and completed visit with audio only. ? ?Some vital signs may be absent or patient reported.  ? ?Cynthia Driver, LPN ? ?Review of Systems    ? ?Cardiac Risk Factors include: advanced age (>73mn, >>67women);hypertension;sedentary lifestyle;obesity (BMI >30kg/m2) ? ?   ?Objective:  ?  ?Today's Vitals  ? 01/30/22 1120  ?Weight: 195 lb (88.5 kg)  ?Height: _0  (1.575 m)  ? ?Body mass index is 35.67 kg/m?. ? ? ?  01/30/2022  ? 12:40 PM 01/30/2022  ? 12:19 PM 11/21/2020  ?  8:57 AM 11/17/2019  ? 11:03 AM 11/12/2018  ?  3:12 PM 11/07/2017  ?  9:06 AM 11/06/2016  ?  9:39 AM  ?Advanced Directives  ?Does Patient Have a Medical Advance Directive? _1  No No  ?Would patient like information on creating a medical advance directive? No - Patient declined No - Patient declined No - Patient declined Yes (MAU/Ambulatory/Procedural Areas - Information given) No - Patient declined Yes (MAU/Ambulatory/Procedural Areas - Information given)   ? ? ?Current Medications (verified) ?Outpatient Encounter Medications as of 01/30/2022  ?Medication Sig  ?  Accu-Chek Softclix Lancets lancets Use once a day  ? Biotin 5 MG TABS Take 5 mg by mouth daily.   ? Blood Glucose Monitoring Suppl (ACCU-CHEK GUIDE ME) w/Device KIT Use as advised  ? Cholecalciferol (VITAMIN D-3) 125 MCG (5000 UT) TABS Take 1 tablet by mouth daily.  ? COLLAGEN PO Take by mouth.  ? Collagen-Vitamin C-Biotin (COLLAGEN 1500/C) 500-50-0.8 MG CAPS Take by mouth.  ? COVID-19 mRNA Vac-TriS, Pfizer, (PFIZER-BIONT COVID-19 VAC-TRIS) SUSP injection Inject into the muscle.  ? cycloSPORINE (RESTASIS) 0.05 % ophthalmic emulsion Place 1 drop into both eyes 2 (two) times daily.  ? Famotidine (PEPCID PO) Take by mouth as needed. OTC  ? fluticasone (FLONASE) 50 MCG/ACT nasal spray Place 2 sprays into both nostrils daily.  ? glucose blood (ACCU-CHEK GUIDE) test strip Use once a day, rotating check times  ? Lactase (LACTAID PO) Take by mouth as needed.  ? loratadine (CLARITIN) 10 MG tablet Take 10 mg by mouth daily as needed.   ? Menaquinone-7 (VITAMIN K2 PO) Take by mouth daily.  ? nebivolol (BYSTOLIC) 2.5 MG tablet TAKE 1 TABLET BY MOUTH DAILY  ? NONFORMULARY OR COMPOUNDED ITEM 2 capsules 2 (two) times daily. Hydro-eye  ? ondansetron (ZOFRAN) 4 MG tablet Take 1 tablet (4 mg total) by mouth as directed.  ? Probiotic Product (PROBIOTIC PO) Take by mouth daily.  ? Saw Palmetto, Serenoa repens, (SAW PALMETTO PO) Take by mouth daily.  ? SYNTHROID 50 MCG tablet TAKE 1 TABLET BY MOUTH EVERY MORNING BEFORE BREAKFAST  ? vitamin B-12 (CYANOCOBALAMIN) 500 MCG tablet  Take 500 mcg by mouth daily.  ? vitamin E 400 UNIT capsule Take 400 Units by mouth daily.  ? Zinc 50 MG TABS Take 1 tablet by mouth daily.  ? spironolactone (ALDACTONE) 25 MG tablet Take 1 tablet (25 mg total) by mouth 2 (two) times daily. (Patient not taking: Reported on 01/30/2022)  ? ?No facility-administered encounter medications on file as of 01/30/2022.  ? ? ?Allergies (verified) ?Amoxicillin, Levothyroxine, Statins, and Sulfa antibiotics  ? ?History: ?Past  Medical History:  ?Diagnosis Date  ? Clostridium difficile infection 2013  ? Hirsutism   ? Hyperandrogenemia   ? Hypertension   ? Osteopenia   ? DEXA 2016  ? Other alopecia   ? Polycystic ovaries   ? Tubular adenoma of colon 05/25/2015  ? Unspecified hypothyroidism   ? ?Past Surgical History:  ?Procedure Laterality Date  ? COLONOSCOPY  04/2015  ? TA, diverticulosis, rpt 5 yrs ([facility])  ? COLONOSCOPY  2011  ? adenomatous polyp, rpt 5 yrs ([facility])  ? TONSILLECTOMY    ? WISDOM TOOTH EXTRACTION    ? ?Family History  ?Problem Relation Age of Onset  ? Heart failure Mother   ? Cancer Mother 26  ?     lung  ? Heart disease Father   ? Stroke Father   ? Hypertension Father   ? ?Social History  ? ?Socioeconomic History  ? Marital status: Married  ?  Spouse name: Cynthia Roberson  ? Number of children: Not on file  ? Years of education: Not on file  ? Highest education level: Not on file  ?Occupational History  ? Not on file  ?Tobacco Use  ? Smoking status: Never  ? Smokeless tobacco: Never  ?Vaping Use  ? Vaping Use: Never used  ?Substance and Sexual Activity  ? Alcohol use: Yes  ?  Comment: occassionally  ? Drug use: No  ? Sexual activity: Yes  ?Other Topics Concern  ? Not on file  ?Social History Narrative  ? Lives with husband Cynthia Roberson  ? Occ: retired, was Mudlogger   ? Activity: stays active caring for grandchildren  ? Diet: good water, fruits/vegetables daily   ? ?Social Determinants of Health  ? ?Financial Resource Strain: Low Risk   ? Difficulty of Paying Living Expenses: Not hard at all  ?Food Insecurity: No Food Insecurity  ? Worried About Charity fundraiser in the Last Year: Never true  ? Ran Out of Food in the Last Year: Never true  ?Transportation Needs: No Transportation Needs  ? Lack of Transportation (Medical): No  ? Lack of Transportation (Non-Medical): No  ?Physical Activity: Insufficiently Active  ? Days of Exercise per Week: 3 days  ? Minutes of Exercise per Session: 30 min  ?Stress: No Stress Concern  Present  ? Feeling of Stress : Not at all  ?Social Connections: Socially Integrated  ? Frequency of Communication with Friends and Family: More than three times a week  ? Frequency of Social Gatherings with Friends and Family: More than three times a week  ? Attends Religious Services: More than 4 times per year  ? Active Member of Clubs or Organizations: Yes  ? Attends Archivist Meetings: More than 4 times per year  ? Marital Status: Married  ? ? ?Tobacco Counseling ?Counseling given: Not Answered ? ? ?Clinical Intake: ? ?Pre-visit preparation completed: Yes ? ?Pain : No/denies pain ? ?  ? ?BMI - recorded: 35.67 ?Nutritional Status: BMI > 30  Obese ?Nutritional Risks:  None ?Diabetes: No ? ?How often do you need to have someone help you when you read instructions, pamphlets, or other written materials from your doctor or pharmacy?: 1 - Never ? ?Diabetic?. Pt states she is pre-diabetic. ? ?Interpreter Needed?: No ? ?Information entered by :: mj Valor Quaintance, lpn ? ? ?Activities of Daily Living ? ?  01/30/2022  ? 12:00 PM  ?In your present state of health, do you have any difficulty performing the following activities:  ?Hearing? 0  ?Vision? 0  ?Difficulty concentrating or making decisions? 0  ?Walking or climbing stairs? 0  ?Dressing or bathing? 0  ?Doing errands, shopping? 0  ?Preparing Food and eating ? N  ?Using the Toilet? N  ?In the past six months, have you accidently leaked urine? N  ?Do you have problems with loss of bowel control? N  ?Managing your Medications? N  ?Managing your Finances? N  ?Housekeeping or managing your Housekeeping? N  ? ? ?Patient Care Team: ?Ria Bush, MD as PCP - General (Family Medicine) ?Philemon Kingdom, MD as Consulting Physician (Internal Medicine) ?Rolm Bookbinder, MD as Consulting Physician (Dermatology) ?Rolley Sims, OD as Consulting Physician (Optometry) ?Arta Silence, MD as Consulting Physician (Gastroenterology) ?Brien Few, MD as Consulting Physician  (Obstetrics and Gynecology) ?Nyra Capes, DDS as Financial risk analyst Physician (Dentistry) ?Harrie Foreman, MD as Consulting Physician (Neurology) ? ?Indicate any recent Medical Services you may have received from other

## 2022-01-30 NOTE — Patient Instructions (Signed)
Ms. Fife , ?Thank you for taking time to come for your Medicare Wellness Visit. I appreciate your ongoing commitment to your health goals. Please review the following plan we discussed and let me know if I can assist you in the future.  ? ?Screening recommendations/referrals: ?Colonoscopy: Done 05/23/2015 Repeat in 5 years ? ?Mammogram: Done 12/16/2017 ?Bone Density: Done 05/14/2021 Repeat every 2 years ? ?Recommended yearly ophthalmology/optometry visit for glaucoma screening and checkup ?Recommended yearly dental visit for hygiene and checkup ? ?Vaccinations: ?Influenza vaccine: Done 08/26/2021 Repeat annually ? ?Pneumococcal vaccine: Done 08/15/2015, 11/11/2016 ?Tdap vaccine: Done 07/11/2021 Repeat in 10 years ? ?Shingles vaccine: Done 01/13/2014, 12/09/2017 and 11/02/2018.    ?Covid-19:Done 03/15/2021, 07/11/2021, 12/29/2019 and 12/04/2019 ? ?Advanced directives: Advance directive discussed with you today. Even though you declined this today, please call our office should you change your mind, and we can give you the proper paperwork for you to fill out. ? ? ?Conditions/risks identified: Aim for 30 minutes of exercise or brisk walking, 6-8 glasses of water, and 5 servings of fruits and vegetables each day. ? ? ?Next appointment: Follow up in one year for your annual wellness visit 2024. ? ? ?Preventive Care 60 Years and Older, Female ?Preventive care refers to lifestyle choices and visits with your health care provider that can promote health and wellness. ?What does preventive care include? ?A yearly physical exam. This is also called an annual well check. ?Dental exams once or twice a year. ?Routine eye exams. Ask your health care provider how often you should have your eyes checked. ?Personal lifestyle choices, including: ?Daily care of your teeth and gums. ?Regular physical activity. ?Eating a healthy diet. ?Avoiding tobacco and drug use. ?Limiting alcohol use. ?Practicing safe sex. ?Taking low-dose aspirin every  day. ?Taking vitamin and mineral supplements as recommended by your health care provider. ?What happens during an annual well check? ?The services and screenings done by your health care provider during your annual well check will depend on your age, overall health, lifestyle risk factors, and family history of disease. ?Counseling  ?Your health care provider may ask you questions about your: ?Alcohol use. ?Tobacco use. ?Drug use. ?Emotional well-being. ?Home and relationship well-being. ?Sexual activity. ?Eating habits. ?History of falls. ?Memory and ability to understand (cognition). ?Work and work Statistician. ?Reproductive health. ?Screening  ?You may have the following tests or measurements: ?Height, weight, and BMI. ?Blood pressure. ?Lipid and cholesterol levels. These may be checked every 5 years, or more frequently if you are over 39 years old. ?Skin check. ?Lung cancer screening. You may have this screening every year starting at age 58 if you have a 30-pack-year history of smoking and currently smoke or have quit within the past 15 years. ?Fecal occult blood test (FOBT) of the stool. You may have this test every year starting at age 64. ?Flexible sigmoidoscopy or colonoscopy. You may have a sigmoidoscopy every 5 years or a colonoscopy every 10 years starting at age 53. ?Hepatitis C blood test. ?Hepatitis B blood test. ?Sexually transmitted disease (STD) testing. ?Diabetes screening. This is done by checking your blood sugar (glucose) after you have not eaten for a while (fasting). You may have this done every 1-3 years. ?Bone density scan. This is done to screen for osteoporosis. You may have this done starting at age 44. ?Mammogram. This may be done every 1-2 years. Talk to your health care provider about how often you should have regular mammograms. ?Talk with your health care provider about your test  results, treatment options, and if necessary, the need for more tests. ?Vaccines  ?Your health care  provider may recommend certain vaccines, such as: ?Influenza vaccine. This is recommended every year. ?Tetanus, diphtheria, and acellular pertussis (Tdap, Td) vaccine. You may need a Td booster every 10 years. ?Zoster vaccine. You may need this after age 46. ?Pneumococcal 13-valent conjugate (PCV13) vaccine. One dose is recommended after age 36. ?Pneumococcal polysaccharide (PPSV23) vaccine. One dose is recommended after age 24. ?Talk to your health care provider about which screenings and vaccines you need and how often you need them. ?This information is not intended to replace advice given to you by your health care provider. Make sure you discuss any questions you have with your health care provider. ?Document Released: 11/10/2015 Document Revised: 07/03/2016 Document Reviewed: 08/15/2015 ?Elsevier Interactive Patient Education ? 2017 [place]. ? ?Fall Prevention in the Home ?Falls can cause injuries. They can happen to people of all ages. There are many things you can do to make your home safe and to help prevent falls. ?What can I do on the outside of my home? ?Regularly fix the edges of walkways and driveways and fix any cracks. ?Remove anything that might make you trip as you walk through a door, such as a raised step or threshold. ?Trim any bushes or trees on the path to your home. ?Use bright outdoor lighting. ?Clear any walking paths of anything that might make someone trip, such as rocks or tools. ?Regularly check to see if handrails are loose or broken. Make sure that both sides of any steps have handrails. ?Any raised decks and porches should have guardrails on the edges. ?Have any leaves, snow, or ice cleared regularly. ?Use sand or salt on walking paths during winter. ?Clean up any spills in your garage right away. This includes oil or grease spills. ?What can I do in the bathroom? ?Use night lights. ?Install grab bars by the toilet and in the tub and shower. Do not use towel bars as grab  bars. ?Use non-skid mats or decals in the tub or shower. ?If you need to sit down in the shower, use a plastic, non-slip stool. ?Keep the floor dry. Clean up any water that spills on the floor as soon as it happens. ?Remove soap buildup in the tub or shower regularly. ?Attach bath mats securely with double-sided non-slip rug tape. ?Do not have throw rugs and other things on the floor that can make you trip. ?What can I do in the bedroom? ?Use night lights. ?Make sure that you have a light by your bed that is easy to reach. ?Do not use any sheets or blankets that are too big for your bed. They should not hang down onto the floor. ?Have a firm chair that has side arms. You can use this for support while you get dressed. ?Do not have throw rugs and other things on the floor that can make you trip. ?What can I do in the kitchen? ?Clean up any spills right away. ?Avoid walking on wet floors. ?Keep items that you use a lot in easy-to-reach places. ?If you need to reach something above you, use a strong step stool that has a grab bar. ?Keep electrical cords out of the way. ?Do not use floor polish or wax that makes floors slippery. If you must use wax, use non-skid floor wax. ?Do not have throw rugs and other things on the floor that can make you trip. ?What can I do with my stairs? ?  Do not leave any items on the stairs. ?Make sure that there are handrails on both sides of the stairs and use them. Fix handrails that are broken or loose. Make sure that handrails are as long as the stairways. ?Check any carpeting to make sure that it is firmly attached to the stairs. Fix any carpet that is loose or worn. ?Avoid having throw rugs at the top or bottom of the stairs. If you do have throw rugs, attach them to the floor with carpet tape. ?Make sure that you have a light switch at the top of the stairs and the bottom of the stairs. If you do not have them, ask someone to add them for you. ?What else can I do to help prevent  falls? ?Wear shoes that: ?Do not have high heels. ?Have rubber bottoms. ?Are comfortable and fit you well. ?Are closed at the toe. Do not wear sandals. ?If you use a stepladder: ?Make sure that it is fully opened. Do not climb

## 2022-01-31 ENCOUNTER — Ambulatory Visit: Payer: Medicare PPO

## 2022-01-31 ENCOUNTER — Other Ambulatory Visit (INDEPENDENT_AMBULATORY_CARE_PROVIDER_SITE_OTHER): Payer: Medicare PPO

## 2022-01-31 ENCOUNTER — Encounter: Payer: Self-pay | Admitting: Gastroenterology

## 2022-01-31 DIAGNOSIS — I10 Essential (primary) hypertension: Secondary | ICD-10-CM | POA: Diagnosis not present

## 2022-01-31 DIAGNOSIS — E039 Hypothyroidism, unspecified: Secondary | ICD-10-CM

## 2022-01-31 DIAGNOSIS — E559 Vitamin D deficiency, unspecified: Secondary | ICD-10-CM

## 2022-01-31 DIAGNOSIS — E785 Hyperlipidemia, unspecified: Secondary | ICD-10-CM

## 2022-01-31 DIAGNOSIS — D751 Secondary polycythemia: Secondary | ICD-10-CM | POA: Diagnosis not present

## 2022-01-31 LAB — CBC WITH DIFFERENTIAL/PLATELET
Basophils Absolute: 0 10*3/uL (ref 0.0–0.1)
Basophils Relative: 0.6 % (ref 0.0–3.0)
Eosinophils Absolute: 0.2 10*3/uL (ref 0.0–0.7)
Eosinophils Relative: 3 % (ref 0.0–5.0)
HCT: 45.2 % (ref 36.0–46.0)
Hemoglobin: 14.8 g/dL (ref 12.0–15.0)
Lymphocytes Relative: 35.3 % (ref 12.0–46.0)
Lymphs Abs: 1.8 10*3/uL (ref 0.7–4.0)
MCHC: 32.7 g/dL (ref 30.0–36.0)
MCV: 80.1 fl (ref 78.0–100.0)
Monocytes Absolute: 0.4 10*3/uL (ref 0.1–1.0)
Monocytes Relative: 8.1 % (ref 3.0–12.0)
Neutro Abs: 2.7 10*3/uL (ref 1.4–7.7)
Neutrophils Relative %: 53 % (ref 43.0–77.0)
Platelets: 224 10*3/uL (ref 150.0–400.0)
RBC: 5.64 Mil/uL — ABNORMAL HIGH (ref 3.87–5.11)
RDW: 14.8 % (ref 11.5–15.5)
WBC: 5.1 10*3/uL (ref 4.0–10.5)

## 2022-01-31 LAB — LIPID PANEL
Cholesterol: 215 mg/dL — ABNORMAL HIGH (ref 0–200)
HDL: 59.1 mg/dL (ref >39.00)
LDL Cholesterol: 124 mg/dL — ABNORMAL HIGH (ref 0–99)
NonHDL: 155.6
Total CHOL/HDL Ratio: 4
Triglycerides: 157 mg/dL — ABNORMAL HIGH (ref 0.0–149.0)
VLDL: 31.4 mg/dL (ref 0.0–40.0)

## 2022-01-31 LAB — COMPREHENSIVE METABOLIC PANEL
ALT: 37 U/L — ABNORMAL HIGH (ref 0–35)
AST: 27 U/L (ref 0–37)
Albumin: 4.4 g/dL (ref 3.5–5.2)
Alkaline Phosphatase: 59 U/L (ref 39–117)
BUN: 13 mg/dL (ref 6–23)
CO2: 30 mEq/L (ref 19–32)
Calcium: 9.8 mg/dL (ref 8.4–10.5)
Chloride: 103 mEq/L (ref 96–112)
Creatinine, Ser: 0.98 mg/dL (ref 0.40–1.20)
GFR: 57.92 mL/min — ABNORMAL LOW (ref >60.00)
Glucose, Bld: 107 mg/dL — ABNORMAL HIGH (ref 70–99)
Potassium: 4.9 mEq/L (ref 3.5–5.1)
Sodium: 140 mEq/L (ref 135–145)
Total Bilirubin: 0.6 mg/dL (ref 0.2–1.2)
Total Protein: 6.5 g/dL (ref 6.0–8.3)

## 2022-01-31 LAB — MICROALBUMIN / CREATININE URINE RATIO
Creatinine,U: 40.1 mg/dL
Microalb Creat Ratio: 1.7 mg/g (ref 0.0–30.0)
Microalb, Ur: <0.7 mg/dL (ref 0.0–1.9)

## 2022-01-31 LAB — TSH: TSH: 2.16 u[IU]/mL (ref 0.35–5.50)

## 2022-01-31 LAB — VITAMIN D 25 HYDROXY (VIT D DEFICIENCY, FRACTURES): VITD: 66.03 ng/mL (ref 30.00–100.00)

## 2022-02-04 ENCOUNTER — Encounter: Payer: Medicare PPO | Admitting: Family Medicine

## 2022-02-11 ENCOUNTER — Ambulatory Visit (INDEPENDENT_AMBULATORY_CARE_PROVIDER_SITE_OTHER): Payer: Medicare PPO | Admitting: Family Medicine

## 2022-02-11 ENCOUNTER — Encounter: Payer: Self-pay | Admitting: Family Medicine

## 2022-02-11 VITALS — BP 134/82 | HR 76 | Temp 97.7°F | Ht 61.25 in | Wt 200.0 lb

## 2022-02-11 DIAGNOSIS — I10 Essential (primary) hypertension: Secondary | ICD-10-CM | POA: Diagnosis not present

## 2022-02-11 DIAGNOSIS — E281 Androgen excess: Secondary | ICD-10-CM | POA: Diagnosis not present

## 2022-02-11 DIAGNOSIS — M8580 Other specified disorders of bone density and structure, unspecified site: Secondary | ICD-10-CM

## 2022-02-11 DIAGNOSIS — Z0000 Encounter for general adult medical examination without abnormal findings: Secondary | ICD-10-CM | POA: Diagnosis not present

## 2022-02-11 DIAGNOSIS — E559 Vitamin D deficiency, unspecified: Secondary | ICD-10-CM

## 2022-02-11 DIAGNOSIS — D751 Secondary polycythemia: Secondary | ICD-10-CM | POA: Diagnosis not present

## 2022-02-11 DIAGNOSIS — R7303 Prediabetes: Secondary | ICD-10-CM

## 2022-02-11 DIAGNOSIS — E785 Hyperlipidemia, unspecified: Secondary | ICD-10-CM

## 2022-02-11 DIAGNOSIS — E039 Hypothyroidism, unspecified: Secondary | ICD-10-CM | POA: Diagnosis not present

## 2022-02-11 DIAGNOSIS — E282 Polycystic ovarian syndrome: Secondary | ICD-10-CM

## 2022-02-11 NOTE — Progress Notes (Signed)
? ? Patient ID: Cynthia Roberson, female    DOB: [birth year], 72 y.o.   MRN: [id number] ? ?This visit was conducted in person. ? ?BP 134/82   Pulse 76   Temp 97.7 ?F (36.5 ?C) (Temporal)   Ht 5' 1.25" (1.556 m)   Wt 200 lb (90.7 kg)   SpO2 97%   BMI 37.48 kg/m?   ? ?CC: CPE ?Subjective:  ? ?HPI: ?Cynthia Roberson is a 71 y.o. female presenting on 02/11/2022 for Annual Exam [facility] prt 2. ) ? ? ?Saw health advisor earlier this month for medicare wellness visit. Note reviewed. 6CIT score normal 01/2022.  ? ?No results found.  ?Flowsheet Row Clinical Support from 01/30/2022 in [facility] [place]  ?PHQ-2 Total Score 0  ? ?  ?  ? ?  01/30/2022  ? 11:55 AM 11/21/2020  ?  8:51 AM 11/17/2019  ? 11:04 AM 11/12/2018  ?  9:39 AM 11/07/2017  ?  9:04 AM  ?Fall Risk   ?Falls in the past year? 0 0 0 0 No  ?Number falls in past yr: 0 0 0    ?Injury with Fall? 0 0 0    ?Risk for fall due to : No Fall Risks No Fall Risks Medication side effect    ?Follow up Falls prevention discussed Falls evaluation completed;Falls prevention discussed Falls evaluation completed;Falls prevention discussed    ? ? ?Sees Dr Cruzita Lederer for PCOS and hypothyroidism. She stopped metformin 09/2020 due to chronic diarrhea with improvement off med. Recently restarted spironolactone 53m bid by endo. She subsequently stopped in the past month due to concern over interaction with nebivolol.  ? ?L facial pain - saw dentist and oral surgeon - thought neuralgia - ?trigeminal neuralgia. She finds topical biofreeze is helpful. Describes "bolt of lightning" pain when it happens.  ? ?She finds [employer] collagen powder has helped her joint pains.  ? ?Preventative: ?COLONOSCOPY 04/2015 - TA, diverticulosis, rpt 5 yrs (Outlaw). Due - will call for appointment.  ?Colonoscopy - to see Dr SFuller Plantomorrow.  ?Well woman - sees Dr TRonita Hippsyearly - aged out of cervical cancer screening ?Breast cancer screening - through OBGYN office. Will request records today.  ?DEXA - h/o  osteopenia, latest DEXA 04/2021 WNL - this is followed by OBGYN.  ?Lung cancer screening - not eligible  ?Flu shot - yearly  ?[place]/2021, 12/2019, booster 02/2021, bivalent 06/2021 ?Tdap 2012, 2022 ?Prevnar-13 2016, pneumovax 2018  ?zostavax -2015  ?shingrix - 11/2017, 10/2018  ?Advanced directive discussion - received advanced directive packet 2020 - continues working on this. Husband would be HCPOA then daughter HNira Conn ?Seat belt use discussed  ?Sunscreen use discussed, no changing moles on skin - sees derm yearly (Rolm Bookbinderwho is to retire)  ?Nonsmoker  ?Alcohol - rare  ?Dentist q6 mo  ?Eye exam q6 mo ([name] (dry eyes on restasis)  ?Bowel - no constipation  ?Bladder - mild stress incontinence  ?  ?Lives with husband MElta Guadeloupe ?Occ: retired, was mMudlogger ?Activity: stays active caring for grandchildren - hasn't used treadmill recently  ?Diet: good water, fruits/vegetables daily  ?   ? ?Relevant past medical, surgical, family and social history reviewed and updated as indicated. Interim medical history since our last visit reviewed. ?Allergies and medications reviewed and updated. ?Outpatient Medications Prior to Visit  ?Medication Sig Dispense Refill  ? Accu-Chek Softclix Lancets lancets Use once a day 100 each 3  ? Biotin 5 MG TABS  Take 5 mg by mouth daily.     ? Blood Glucose Monitoring Suppl (ACCU-CHEK GUIDE ME) w/Device KIT Use as advised 1 kit 1  ? Cholecalciferol (VITAMIN D-3) 125 MCG (5000 UT) TABS Take 1 tablet by mouth daily. 30 tablet   ? COLLAGEN PO Take by mouth.    ? COVID-19 mRNA Vac-TriS, Pfizer, (PFIZER-BIONT COVID-19 VAC-TRIS) SUSP injection Inject into the muscle. 0.3 mL 0  ? cycloSPORINE (RESTASIS) 0.05 % ophthalmic emulsion Place 1 drop into both eyes 2 (two) times daily.    ? Famotidine (PEPCID PO) Take by mouth as needed. OTC    ? fluticasone (FLONASE) 50 MCG/ACT nasal spray Place 2 sprays into both nostrils daily.    ? glucose blood (ACCU-CHEK GUIDE) test  strip Use once a day, rotating check times 100 each 3  ? Lactase (LACTAID PO) Take by mouth as needed.    ? loratadine (CLARITIN) 10 MG tablet Take 10 mg by mouth daily as needed.     ? Menaquinone-7 (VITAMIN K2 PO) Take by mouth daily.    ? nebivolol (BYSTOLIC) 2.5 MG tablet TAKE 1 TABLET BY MOUTH DAILY 90 tablet 3  ? NONFORMULARY OR COMPOUNDED ITEM 2 capsules 2 (two) times daily. Hydro-eye    ? ondansetron (ZOFRAN) 4 MG tablet Take 1 tablet (4 mg total) by mouth as directed. 3 tablet 0  ? Probiotic Product (PROBIOTIC PO) Take by mouth daily.    ? Saw Palmetto, Serenoa repens, (SAW PALMETTO PO) Take by mouth daily.    ? spironolactone (ALDACTONE) 25 MG tablet Take 1 tablet (25 mg total) by mouth 2 (two) times daily. (Patient not taking: Reported on 01/30/2022) 180 tablet 3  ? SYNTHROID 50 MCG tablet TAKE 1 TABLET BY MOUTH EVERY MORNING BEFORE BREAKFAST 90 tablet 3  ? vitamin B-12 (CYANOCOBALAMIN) 500 MCG tablet Take 500 mcg by mouth daily.    ? vitamin E 400 UNIT capsule Take 400 Units by mouth daily.    ? Zinc 50 MG TABS Take 1 tablet by mouth daily.    ? Collagen-Vitamin C-Biotin (COLLAGEN 1500/C) 500-50-0.8 MG CAPS Take by mouth.    ? ?No facility-administered medications prior to visit.  ?  ? ?Per HPI unless specifically indicated in ROS section below ?Review of Systems  ?Constitutional:  Negative for activity change, appetite change, chills, fatigue, fever and unexpected weight change.  ?HENT:  Negative for hearing loss.   ?Eyes:  Negative for visual disturbance.  ?Respiratory:  Positive for cough (pollen related). Negative for chest tightness, shortness of breath and wheezing.   ?Cardiovascular:  Negative for chest pain, palpitations and leg swelling.  ?Gastrointestinal:  Negative for abdominal distention, abdominal pain, blood in stool, constipation, diarrhea, nausea and vomiting.  ?Genitourinary:  Negative for difficulty urinating and hematuria.  ?Musculoskeletal:  Negative for arthralgias, myalgias and neck  pain.  ?Skin:  Negative for rash.  ?Neurological:  Negative for dizziness, seizures, syncope and headaches.  ?Hematological:  Negative for adenopathy. Does not bruise/bleed easily.  ?Psychiatric/Behavioral:  Negative for dysphoric mood. The patient is not nervous/anxious.   ? ?Objective:  ?BP 134/82   Pulse 76   Temp 97.7 ?F (36.5 ?C) (Temporal)   Ht 5' 1.25" (1.556 m)   Wt 200 lb (90.7 kg)   SpO2 97%   BMI 37.48 kg/m?   ?Wt Readings from Last 3 Encounters:  ?02/11/22 200 lb (90.7 kg)  ?01/30/22 195 lb (88.5 kg)  ?12/20/21 195 lb 4 oz (88.6 kg)  ?  ?  ?  Physical Exam ?Vitals and nursing note reviewed.  ?Constitutional:   ?   Appearance: Normal appearance. She is not ill-appearing.  ?HENT:  ?   Head: Normocephalic and atraumatic.  ?   Right Ear: Tympanic membrane, ear canal and external ear normal. There is no impacted cerumen.  ?   Left Ear: Tympanic membrane, ear canal and external ear normal. There is no impacted cerumen.  ?Eyes:  ?   General:     ?   Right eye: No discharge.     ?   Left eye: No discharge.  ?   Extraocular Movements: Extraocular movements intact.  ?   Conjunctiva/sclera: Conjunctivae normal.  ?   Pupils: Pupils are equal, round, and reactive to light.  ?Neck:  ?   Thyroid: No thyroid mass or thyromegaly.  ?   Vascular: No carotid bruit.  ?Cardiovascular:  ?   Rate and Rhythm: Normal rate and regular rhythm.  ?   Pulses: Normal pulses.  ?   Heart sounds: Normal heart sounds. No murmur heard. ?Pulmonary:  ?   Effort: Pulmonary effort is normal. No respiratory distress.  ?   Breath sounds: Normal breath sounds. No wheezing, rhonchi or rales.  ?Abdominal:  ?   General: Bowel sounds are normal. There is no distension.  ?   Palpations: Abdomen is soft. There is no mass.  ?   Tenderness: There is no abdominal tenderness. There is no guarding or rebound.  ?   Hernia: No hernia is present.  ?Musculoskeletal:  ?   Cervical back: Normal range of motion and neck supple. No rigidity.  ?   Right lower  leg: No edema.  ?   Left lower leg: No edema.  ?Lymphadenopathy:  ?   Cervical: No cervical adenopathy.  ?Skin: ?   General: Skin is warm and dry.  ?   Findings: No rash.  ?Neurological:  ?   General: No focal deficit presen

## 2022-02-11 NOTE — Patient Instructions (Addendum)
We will request latest mammogram through [facility] (Dr Ronita Hipps).  ?We will request latest bone density can through [facility] (04/2021). ?Good to see you today ?Return as needed or in 6 months for follow up visit.  ? ?Health Maintenance After Age 72 ?After age 81, you are at a higher risk for certain long-term diseases and infections as well as injuries from falls. Falls are a major cause of broken bones and head injuries in people who are older than age 18. Getting regular preventive care can help to keep you healthy and well. Preventive care includes getting regular testing and making lifestyle changes as recommended by your health care provider. Talk with your health care provider about: ?Which screenings and tests you should have. A screening is a test that checks for a disease when you have no symptoms. ?A diet and exercise plan that is right for you. ?What should I know about screenings and tests to prevent falls? ?Screening and testing are the best ways to find a health problem early. Early diagnosis and treatment give you the best chance of managing medical conditions that are common after age 72. Certain conditions and lifestyle choices may make you more likely to have a fall. Your health care provider may recommend: ?Regular vision checks. Poor vision and conditions such as cataracts can make you more likely to have a fall. If you wear glasses, make sure to get your prescription updated if your vision changes. ?Medicine review. Work with your health care provider to regularly review all of the medicines you are taking, including over-the-counter medicines. Ask your health care provider about any side effects that may make you more likely to have a fall. Tell your health care provider if any medicines that you take make you feel dizzy or sleepy. ?Strength and balance checks. Your health care provider may recommend certain tests to check your strength and balance while standing, walking, or changing  positions. ?Foot health exam. Foot pain and numbness, as well as not wearing proper footwear, can make you more likely to have a fall. ?Screenings, including: ?Osteoporosis screening. Osteoporosis is a condition that causes the bones to get weaker and break more easily. ?Blood pressure screening. Blood pressure changes and medicines to control blood pressure can make you feel dizzy. ?Depression screening. You may be more likely to have a fall if you have a fear of falling, feel depressed, or feel unable to do activities that you used to do. ?Alcohol use screening. Using too much alcohol can affect your balance and may make you more likely to have a fall. ?Follow these instructions at home: ?Lifestyle ?Do not drink alcohol if: ?Your health care provider tells you not to drink. ?If you drink alcohol: ?Limit how much you have to: ?0-1 drink a day for women. ?0-2 drinks a day for men. ?Know how much alcohol is in your drink. In the [place] one drink equals one 12 oz bottle of beer (355 mL), one 5 oz glass of wine (148 mL), or one 1? oz glass of hard liquor (44 mL). ?Do not use any products that contain nicotine or tobacco. These products include cigarettes, chewing tobacco, and vaping devices, such as e-cigarettes. If you need help quitting, ask your health care provider. ?Activity ? ?Follow a regular exercise program to stay fit. This will help you maintain your balance. Ask your health care provider what types of exercise are appropriate for you. ?If you need a cane or walker, use it as recommended by your health  care provider. ?Wear supportive shoes that have nonskid soles. ?Safety ? ?Remove any tripping hazards, such as rugs, cords, and clutter. ?Install safety equipment such as grab bars in bathrooms and safety rails on stairs. ?Keep rooms and walkways well-lit. ?General instructions ?Talk with your health care provider about your risks for falling. Tell your health care provider if: ?You fall. Be sure to tell your  health care provider about all falls, even ones that seem minor. ?You feel dizzy, tiredness (fatigue), or off-balance. ?Take over-the-counter and prescription medicines only as told by your health care provider. These include supplements. ?Eat a healthy diet and maintain a healthy weight. A healthy diet includes low-fat dairy products, low-fat (lean) meats, and fiber from whole grains, beans, and lots of fruits and vegetables. ?Stay current with your vaccines. ?Schedule regular health, dental, and eye exams. ?Summary ?Having a healthy lifestyle and getting preventive care can help to protect your health and wellness after age 48. ?Screening and testing are the best way to find a health problem early and help you avoid having a fall. Early diagnosis and treatment give you the best chance for managing medical conditions that are more common for people who are older than age 20. ?Falls are a major cause of broken bones and head injuries in people who are older than age 2. Take precautions to prevent a fall at home. ?Work with your health care provider to learn what changes you can make to improve your health and wellness and to prevent falls. ?This information is not intended to replace advice given to you by your health care provider. Make sure you discuss any questions you have with your health care provider. ?Document Revised: 03/05/2021 Document Reviewed: 03/05/2021 ?Elsevier Patient Education ? [place]. ? ?

## 2022-02-11 NOTE — Assessment & Plan Note (Signed)
Preventative protocols reviewed and updated unless pt declined. Discussed healthy diet and lifestyle.  

## 2022-02-12 ENCOUNTER — Encounter: Payer: Self-pay | Admitting: Gastroenterology

## 2022-02-12 ENCOUNTER — Ambulatory Visit (AMBULATORY_SURGERY_CENTER): Payer: Medicare PPO | Admitting: Gastroenterology

## 2022-02-12 VITALS — BP 118/73 | HR 67 | Temp 98.6°F | Resp 16 | Ht 62.0 in | Wt 195.0 lb

## 2022-02-12 DIAGNOSIS — K5730 Diverticulosis of large intestine without perforation or abscess without bleeding: Secondary | ICD-10-CM | POA: Diagnosis not present

## 2022-02-12 DIAGNOSIS — Z86010 Personal history of colonic polyps: Secondary | ICD-10-CM

## 2022-02-12 DIAGNOSIS — D123 Benign neoplasm of transverse colon: Secondary | ICD-10-CM | POA: Diagnosis not present

## 2022-02-12 DIAGNOSIS — K921 Melena: Secondary | ICD-10-CM

## 2022-02-12 DIAGNOSIS — I10 Essential (primary) hypertension: Secondary | ICD-10-CM | POA: Diagnosis not present

## 2022-02-12 DIAGNOSIS — D128 Benign neoplasm of rectum: Secondary | ICD-10-CM

## 2022-02-12 DIAGNOSIS — K641 Second degree hemorrhoids: Secondary | ICD-10-CM | POA: Diagnosis not present

## 2022-02-12 DIAGNOSIS — D124 Benign neoplasm of descending colon: Secondary | ICD-10-CM | POA: Diagnosis not present

## 2022-02-12 MED ORDER — SODIUM CHLORIDE 0.9 % IV SOLN: 500.0000 mL | Freq: Once | INTRAVENOUS | Status: DC

## 2022-02-12 NOTE — Assessment & Plan Note (Signed)
Continues endo f/u ?

## 2022-02-12 NOTE — Progress Notes (Signed)
VS completed by DT.    Medical history reviewed and updated.  

## 2022-02-12 NOTE — Op Note (Signed)
[place] [facility] ?Patient Name: Cynthia Roberson ?Procedure Date: 02/12/2022 9:30 AM ?MRN: [id number] ?Endoscopist: Ladene Artist , MD ?Age: 71 ?Referring MD:  ?Date of Birth: [birth year] ?Gender: Female ?Account #: [id number] ?Procedure:                Colonoscopy ?Indications:              Surveillance: Personal history of adenomatous and  ?                          sessile serrated polyps on last colonoscopy > 5  ?                          years ago, Hematochezia ?Medicines:                Monitored Anesthesia Care ?Procedure:                Pre-Anesthesia Assessment: ?                          - Prior to the procedure, a History and Physical  ?                          was performed, and patient medications and  ?                          allergies were reviewed. The patient's tolerance of  ?                          previous anesthesia was also reviewed. The risks  ?                          and benefits of the procedure and the sedation  ?                          options and risks were discussed with the patient.  ?                          All questions were answered, and informed consent  ?                          was obtained. Prior Anticoagulants: The patient has  ?                          taken no previous anticoagulant or antiplatelet  ?                          agents. ASA Grade Assessment: II - A patient with  ?                          mild systemic disease. After reviewing the risks  ?                          and benefits, the patient was deemed in  ?  satisfactory condition to undergo the procedure. ?                          After obtaining informed consent, the colonoscope  ?                          was passed under direct vision. Throughout the  ?                          procedure, the patient's blood pressure, pulse, and  ?                          oxygen saturations were monitored continuously. The  ?                          Olympus CF-HQ190L ([id number])  Colonoscope was  ?                          introduced through the anus and advanced to the the  ?                          cecum, identified by appendiceal orifice and  ?                          ileocecal valve. The ileocecal valve, appendiceal  ?                          orifice, and rectum were photographed. The quality  ?                          of the bowel preparation was good. The colonoscopy  ?                          was performed without difficulty. The patient  ?                          tolerated the procedure well. ?Scope In: 9:49:39 AM ?Scope Out: 10:05:57 AM ?Scope Withdrawal Time: 0 hours 14 minutes 22 seconds  ?Total Procedure Duration: 0 hours 16 minutes 18 seconds  ?Findings:                 The perianal and digital rectal examinations were  ?                          normal. ?                          Four sessile polyps were found in the rectum,  ?                          descending colon and transverse colon (2). The  ?                          polyps were 5 to 7 mm in size. These polyps were  ?  removed with a cold snare. Resection and retrieval  ?                          were complete. ?                          Multiple small-mouthed diverticula were found in  ?                          the left colon. There was no evidence of  ?                          diverticular bleeding. ?                          Internal hemorrhoids were found during  ?                          retroflexion. The hemorrhoids were moderate and  ?                          Grade I (internal hemorrhoids that do not prolapse). ?                          The exam was otherwise without abnormality on  ?                          direct and retroflexion views. ?Complications:            No immediate complications. Estimated blood loss:  ?                          None. ?Estimated Blood Loss:     Estimated blood loss: none. ?Impression:               - Four 5 to 7 mm polyps in the rectum, in the  ?                           descending colon and in the transverse colon,  ?                          removed with a cold snare. Resected and retrieved. ?                          - Mild diverticulosis in the left colon. . ?                          - Internal hemorrhoids. ?                          - The examination was otherwise normal on direct  ?                          and retroflexion views. ?Recommendation:           - Repeat colonoscopy after studies are complete for  ?  surveillance based on pathology results. ?                          - Patient has a contact number available for  ?                          emergencies. The signs and symptoms of potential  ?                          delayed complications were discussed with the  ?                          patient. Return to normal activities tomorrow.  ?                          Written discharge instructions were provided to the  ?                          patient. ?                          - High fiber diet. ?                          - Continue present medications. ?                          - Await pathology results. ?Ladene Artist, MD ?02/12/2022 10:09:07 AM ?This report has been signed electronically. ?

## 2022-02-12 NOTE — Assessment & Plan Note (Signed)
CBC stable

## 2022-02-12 NOTE — Assessment & Plan Note (Signed)
Update A1c next labwork.  ?

## 2022-02-12 NOTE — Patient Instructions (Addendum)
Resume previous medications.  4 polyps removed and sent to pathology.  Await results for final recommendations.  Handouts on findings given to patient.   (Hemorrhoids, polyps and diverticulosis) ? ?YOU HAD AN ENDOSCOPIC PROCEDURE TODAY AT [facility]:   Refer to the procedure report that was given to you for any specific questions about what was found during the examination.  If the procedure report does not answer your questions, please call your gastroenterologist to clarify.  If you requested that your care partner not be given the details of your procedure findings, then the procedure report has been included in a sealed envelope for you to review at your convenience later. ? ?YOU SHOULD EXPECT: Some feelings of bloating in the abdomen. Passage of more gas than usual.  Walking can help get rid of the air that was put into your GI tract during the procedure and reduce the bloating. If you had a lower endoscopy (such as a colonoscopy or flexible sigmoidoscopy) you may notice spotting of blood in your stool or on the toilet paper. If you underwent a bowel prep for your procedure, you may not have a normal bowel movement for a few days. ? ?Please Note:  You might notice some irritation and congestion in your nose or some drainage.  This is from the oxygen used during your procedure.  There is no need for concern and it should clear up in a day or so. ? ?SYMPTOMS TO REPORT IMMEDIATELY: ? ?Following lower endoscopy (colonoscopy or flexible sigmoidoscopy): ? Excessive amounts of blood in the stool ? Significant tenderness or worsening of abdominal pains ? Swelling of the abdomen that is new, acute ? Fever of 100?F or higher ? ? ?For urgent or emergent issues, a gastroenterologist can be reached at any hour by calling [phone]. ?Do not use MyChart messaging for urgent concerns.  ? ? ?DIET:  We do recommend a small meal at first, but then you may proceed to your regular diet.  Drink plenty of  fluids but you should avoid alcoholic beverages for 24 hours. ? ?ACTIVITY:  You should plan to take it easy for the rest of today and you should NOT DRIVE or use heavy machinery until tomorrow (because of the sedation medicines used during the test).   ? ?FOLLOW UP: ?Our staff will call the number listed on your records 48-72 hours following your procedure to check on you and address any questions or concerns that you may have regarding the information given to you following your procedure. If we do not reach you, we will leave a message.  We will attempt to reach you two times.  During this call, we will ask if you have developed any symptoms of COVID 19. If you develop any symptoms (ie: fever, flu-like symptoms, shortness of breath, cough etc.) before then, please call [phone].  If you test positive for Covid 19 in the 2 weeks post procedure, please call and report this information to [place].   ? ?If any biopsies were taken you will be contacted by phone or by letter within the next 1-3 weeks.  Please call us at [phone] if you have not heard about the biopsies in 3 weeks.  ? ? ?SIGNATURES/CONFIDENTIALITY: ?You and/or your care partner have signed paperwork which will be entered into your electronic medical record.  These signatures attest to the fact that that the information above on your After Visit Summary has been reviewed and is understood.  Full responsibility of  the confidentiality of this discharge information lies with you and/or your care-partner.  ?

## 2022-02-12 NOTE — Progress Notes (Signed)
Report to PACU, RN, vss, BBS= Clear.  

## 2022-02-12 NOTE — Assessment & Plan Note (Signed)
Continue to encourage healthy diet and lifestyle choices.  

## 2022-02-12 NOTE — Assessment & Plan Note (Signed)
Latest DEXA 04/2021 WNL. Followed by GYN ?

## 2022-02-12 NOTE — Assessment & Plan Note (Addendum)
Chronic, off medication.  ?The 10-year ASCVD risk score (Arnett DK, et al., 2019) is: 15.3% ?  Values used to calculate the score: ?    Age: 71 years ?    Sex: Female ?    Is Non-Hispanic African American: No ?    Diabetic: No ?    Tobacco smoker: No ?    Systolic Blood Pressure: 022 mmHg ?    Is BP treated: Yes ?    HDL Cholesterol: 59.1 mg/dL ?    Total Cholesterol: 215 mg/dL  ?

## 2022-02-12 NOTE — Assessment & Plan Note (Signed)
Continue endo f/u.  

## 2022-02-12 NOTE — Assessment & Plan Note (Signed)
Continues saw palmetto for hair loss.  ?Recently started spironolactone, then stopped.  ?

## 2022-02-12 NOTE — Assessment & Plan Note (Signed)
Chronic, stable on nebivolol.  ?

## 2022-02-12 NOTE — Progress Notes (Signed)
Called to room to assist during endoscopic procedure.  Patient ID and intended procedure confirmed with present staff. Received instructions for my participation in the procedure from the performing physician.  

## 2022-02-12 NOTE — Progress Notes (Signed)
See 02/11/2022 H&P, no changes.  ?

## 2022-02-12 NOTE — Assessment & Plan Note (Signed)
Continues 5000 IU daily, levels normal.  ?

## 2022-02-14 ENCOUNTER — Telehealth: Payer: Self-pay | Admitting: *Deleted

## 2022-02-14 NOTE — Telephone Encounter (Signed)
?  Follow up Call- ? ? ?  02/12/2022  ?  9:00 AM  ?Call back number  ?Post procedure Call Back phone  # [phone]  ?Permission to leave phone message Yes  ?  ? ?Patient questions: ? ?Do you have a fever, pain , or abdominal swelling? No. ?Pain Score  0 * ? ?Have you tolerated food without any problems? Yes.   ? ?Have you been able to return to your normal activities? Yes.   ? ?Do you have any questions about your discharge instructions: ?Diet   No. ?Medications  No. ?Follow up visit  No. ? ?Do you have questions or concerns about your Care? No. ? ?Actions: ?* If pain score is 4 or above: ?No action needed, pain <4. ? ? ?

## 2022-02-15 ENCOUNTER — Encounter: Payer: Self-pay | Admitting: Family Medicine

## 2022-02-16 ENCOUNTER — Encounter: Payer: Self-pay | Admitting: Physician Assistant

## 2022-02-16 ENCOUNTER — Telehealth: Payer: Medicare PPO | Admitting: Physician Assistant

## 2022-02-16 VITALS — Temp 99.0°F

## 2022-02-16 DIAGNOSIS — U071 COVID-19: Secondary | ICD-10-CM | POA: Diagnosis not present

## 2022-02-16 MED ORDER — NIRMATRELVIR/RITONAVIR (PAXLOVID)TABLET: 3.0000 | ORAL_TABLET | Freq: Two times a day (BID) | ORAL | 0 refills | Status: AC

## 2022-02-16 NOTE — Progress Notes (Signed)
?Virtual Visit Consent  ? ?Cynthia Roberson, you are scheduled for a virtual visit with a [facility] provider today.   ?  ?Just as with appointments in the office, your consent must be obtained to participate.  Your consent will be active for this visit and any virtual visit you may have with one of our providers in the next 365 days.   ?  ?If you have a MyChart account, a copy of this consent can be sent to you electronically.  All virtual visits are billed to your insurance company just like a traditional visit in the office.   ? ?As this is a virtual visit, video technology does not allow for your provider to perform a traditional examination.  This may limit your provider's ability to fully assess your condition.  If your provider identifies any concerns that need to be evaluated in person or the need to arrange testing (such as labs, EKG, etc.), we will make arrangements to do so.   ?  ?Although advances in technology are sophisticated, we cannot ensure that it will always work on either your end or our end.  If the connection with a video visit is poor, the visit may have to be switched to a telephone visit.  With either a video or telephone visit, we are not always able to ensure that we have a secure connection.    ? ?Also, by engaging in this virtual visit, you consent to the provision of healthcare. Additionally, you authorize for your insurance to be billed (if applicable) for the services provided during this visit.  ? ?I need to obtain your verbal consent now.   Are you willing to proceed with your visit today?  ?  ?Cynthia Roberson has provided verbal consent on 02/16/2022 for a virtual visit (video or telephone). ?  ?Cynthia Coke, PA  ? ?Date: 02/16/2022 11:43 AM ? ? ?Virtual Visit via Video Note  ? ?Cynthia Roberson, connected with  Cynthia Roberson  ([id number], 02-19-1951) on 02/16/22 at 11:30 AM EDT by a video-enabled telemedicine application and verified that I am speaking with the correct person  using two identifiers. ? ?Location: ?Patient: Virtual Visit Location Patient: Home ?Provider: Virtual Visit Location Provider: Home Office ?  ?I discussed the limitations of evaluation and management by telemedicine and the availability of in person appointments. The patient expressed understanding and agreed to proceed.   ? ?History of Present Illness: ?Cynthia Roberson is a 71 y.o. who identifies as a female who was assigned female at birth, and is being seen today for COVID-19. ? ?COVID-19 ?First symptom was Thursday afternoon, had a headache. Temperature was 100 yesterday. She started wheezing last night. Took prednisone that she had yesterday, leftover from March visit that she didn't require (prednisone 50 mg daily.) ? ?Pulse oximeter 97-98%. Max temp was 101.5. She took a COVID test this morning. Does has hx of reactive airway/asthma. ? ?Had COVID in Oct 2021 -- had very mild case.  ? ?Denies: COPD, severe SOB, chest pain, LE swelling ? ?HPI: HPI  ?Problems:  ?Patient Active Problem List  ? Diagnosis Date Noted  ? Poor sleep hygiene 12/14/2020  ? Right ankle pain 11/16/2018  ? 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 Vitamin D deficiency 01/10/2018  ? Severe obesity (BMI 35.0-39.9) with comorbidity ([place]) 12/09/2017  ? ETD (Eustachian tube dysfunction), right 11/14/2017  ? Advanced care planning/counseling discussion 11/11/2016  ? Polycythemia, secondary 11/11/2016  ? Dyslipidemia 11/05/2016  ? Routine general medical examination at a  health care facility 07/28/2015  ? Prediabetes 07/27/2015  ? Neuropathy of left upper extremity 02/19/2015  ? Carpal tunnel syndrome of left wrist 09/28/2014  ? 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 PCOS (polycystic ovarian syndrome) 07/06/2014  ? Hypothyroidism 07/06/2014  ? 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 Essential hypertension, benign 07/06/2014  ? Osteopenia 07/06/2014  ?  ?Allergies:  ?Allergies  ?Allergen Reactions  ? Amoxicillin Rash  ? Levothyroxine Palpitations  ?   With generic levothyroxine, tolerates well the brand name.  ? Statins Palpitations  ?  Tried Crestor and others  ? Sulfa Antibiotics Rash  ? ?Medications:  ?Current Outpatient Medications:  ?  nirmatrelvir/ritonavir EUA (PAXLOVID) 20 x 150 MG & 10 x 100MG TABS, Take 3 tablets by mouth 2 (two) times daily for 5 days. (Take nirmatrelvir 150 mg two tablets twice daily for 5 days and ritonavir 100 mg one tablet twice daily for 5 days) Patient GFR is WNL, Disp: 30 tablet, Rfl: 0 ?  Accu-Chek Softclix Lancets lancets, Use once a day, Disp: 100 each, Rfl: 3 ?  Biotin 5 MG TABS, Take 5 mg by mouth daily. , Disp: , Rfl:  ?  Blood Glucose Monitoring Suppl (ACCU-CHEK GUIDE ME) w/Device KIT, Use as advised, Disp: 1 kit, Rfl: 1 ?  Cholecalciferol (VITAMIN D-3) 125 MCG (5000 UT) TABS, Take 1 tablet by mouth daily., Disp: 30 tablet, Rfl:  ?  COLLAGEN PO, Take by mouth., Disp: , Rfl:  ?  COVID-19 mRNA Vac-TriS, Pfizer, (PFIZER-BIONT COVID-19 VAC-TRIS) SUSP injection, Inject into the muscle., Disp: 0.3 mL, Rfl: 0 ?  cycloSPORINE (RESTASIS) 0.05 % ophthalmic emulsion, Place 1 drop into both eyes 2 (two) times daily., Disp: , Rfl:  ?  Famotidine (PEPCID PO), Take by mouth as needed. OTC, Disp: , Rfl:  ?  fluticasone (FLONASE) 50 MCG/ACT nasal spray, Place 2 sprays into both nostrils daily., Disp: , Rfl:  ?  glucose blood (ACCU-CHEK GUIDE) test strip, Use once a day, rotating check times, Disp: 100 each, Rfl: 3 ?  Lactase (LACTAID PO), Take by mouth as needed., Disp: , Rfl:  ?  loratadine (CLARITIN) 10 MG tablet, Take 10 mg by mouth daily as needed. , Disp: , Rfl:  ?  Menaquinone-7 (VITAMIN K2 PO), Take by mouth daily., Disp: , Rfl:  ?  nebivolol (BYSTOLIC) 2.5 MG tablet, TAKE 1 TABLET BY MOUTH DAILY, Disp: 90 tablet, Rfl: 3 ?  NONFORMULARY OR COMPOUNDED ITEM, 2 capsules 2 (two) times daily. Hydro-eye, Disp: , Rfl:  ?  Probiotic Product (PROBIOTIC PO), Take by mouth daily., Disp: , Rfl:  ?  Saw Palmetto, Serenoa repens, (SAW PALMETTO  PO), Take by mouth daily., Disp: , Rfl:  ?  spironolactone (ALDACTONE) 25 MG tablet, Take 1 tablet (25 mg total) by mouth 2 (two) times daily. (Patient not taking: Reported on 01/30/2022), Disp: 180 tablet, Rfl: 3 ?  SYNTHROID 50 MCG tablet, TAKE 1 TABLET BY MOUTH EVERY MORNING BEFORE BREAKFAST, Disp: 90 tablet, Rfl: 3 ?  vitamin B-12 (CYANOCOBALAMIN) 500 MCG tablet, Take 500 mcg by mouth daily., Disp: , Rfl:  ?  vitamin E 400 UNIT capsule, Take 400 Units by mouth daily., Disp: , Rfl:  ?  Zinc 50 MG TABS, Take 1 tablet by mouth daily., Disp: , Rfl:  ? ?Current Facility-Administered Medications:  ?  0.9 %  sodium chloride infusion, 500 mL, Intravenous, Once, Ladene Artist, MD ? ?Observations/Objective: ?Patient is well-developed, well-nourished in no acute distress.  ?Resting comfortably  at home.  ?  Head is normocephalic, atraumatic.  ?No labored breathing.  ?Speech is clear and coherent with logical content.  ?Patient is alert and oriented at baseline.  ? ?Assessment and Plan: ?1. COVID-19 ?No red flags ?Will start Paxlovid at this time -- reviewed medication side effects ?Current GFR >30 ?No apparent medication contraindications  ?ER precautions advised ?Recommended avoiding use of prednisone unless wheezing is persistent -- patient verbalized understanding ? ?Follow Up Instructions: ?I discussed the assessment and treatment plan with the patient. The patient was provided an opportunity to ask questions and all were answered. The patient agreed with the plan and demonstrated an understanding of the instructions.  A copy of instructions were sent to the patient via MyChart unless otherwise noted below.  ? ?The patient was advised to call back or seek an in-person evaluation if the symptoms worsen or if the condition fails to improve as anticipated. ? ?Time:  ?I spent 15 minutes with the patient via telehealth technology discussing the above problems/concerns.   ? ?Cynthia Coke, PA  ?

## 2022-02-18 ENCOUNTER — Telehealth: Payer: Self-pay

## 2022-02-18 NOTE — Telephone Encounter (Signed)
Noted. ?Seen Saturday virtually, started on full dose paxlovid.  ?Plz call tomorrow for update on symptoms.  ?

## 2022-02-18 NOTE — Telephone Encounter (Signed)
Per chart review tab pt had video visit with [facility] on 02/16/22. Sending note to Dr Darnell Level and Lattie Haw CMA. ?

## 2022-02-18 NOTE — Telephone Encounter (Signed)
[facility] Night - Client ?TELEPHONE ADVICE RECORD ?[facility]? ?Patient ?Name: ?Cynthia ?E Roberson ?Gender: Female ?DOB: 27-Feb-1951 ?Age: 71 Y 58 M 3 D ?Return ?Phone ?Number: ?[id number] ?(Primary), ?[id number] ?(Secondary) ?Address: ?City/ ?State/ ?Zip: ?[place] ? [address] ?Client [facility] Night - Client ?Client Site [place] ?Provider Ria Bush - MD ?Contact Type Call ?Who Is Calling Patient / Member / Family / Caregiver ?Call Type Triage / Clinical ?Relationship To Patient Self ?Return Phone Number [phone] (Primary) ?Chief Complaint Headache ?Reason for Call Symptomatic / Request for Health Information ?Initial Comment Caller states she has COVID. States she has ?headaches, cough, body aches, fever of 101.5. ?Started late Thursday. ?Translation No ?Nurse Assessment ?Nurse: Marcello Moores, RN, Lauro Regulus Date/Time Eilene Ghazi Time): 02/16/2022 10:41:35 AM ?Confirm and document reason for call. If ?symptomatic, describe symptoms. ?---Caller states she has COVID. States she has ?headaches, cough, body aches, fever of 101.5. Started ?late Thursday. ?Does the patient have any new or worsening ?symptoms? ---Yes ?Will a triage be completed? ---Yes ?Related visit to physician within the last 2 weeks? ---Yes ?Does the PT have any chronic conditions? (i.e. ?diabetes, asthma, this includes High risk factors for ?pregnancy, etc.) ?---Yes ?List chronic conditions. ---Hyperthyroid, HTN, pre-DM, ?Is this a behavioral health or substance abuse call? ---No ?Guidelines ?Guideline Title Affirmed Question Affirmed Notes Nurse Date/Time (Eastern ?Time) ?COVID-19 - ?Diagnosed or ?Suspected ?[1] Fever > 101 F ?(38.3 C) AND [2] age ?> 60 years ?Marcello Moores, RN, Lauro Regulus 02/16/2022 10:39:46 ?AM ?Disp. Time (Eastern ?Time) Disposition Final User ?02/16/2022 10:56:38 AM See HCP within 4 Hours (or ?PCP triage) ?Yes Marcello Moores, RN, Lauro Regulus ?PLEASE NOTE: All timestamps  contained within this report are represented as [place] Standard Time. ?CONFIDENTIALTY NOTICE: This fax transmission is intended only for the addressee. It contains information that is legally privileged, confidential or ?otherwise protected from use or disclosure. If you are not the intended recipient, you are strictly prohibited from reviewing, disclosing, copying using ?or disseminating any of this information or taking any action in reliance on or regarding this information. If you have received this fax in error, please ?notify us immediately by telephone so that we can arrange for its return to [place]. Phone: [phone], Toll-Free: [phone], Fax: [phone] ?Page: 2 of 2 ?Call Id: [id number] ?Caller Disagree/Comply Comply ?Caller Understands Yes ?PreDisposition Go to Urgent Care/Walk-In Clinic ?Care Advice Given Per Guideline ?SEE HCP (OR PCP TRIAGE) WITHIN 4 HOURS: ?Comments ?User: Bing Matter, RN Date/Time Eilene Ghazi Time): 02/16/2022 10:59:47 AM ?Pt had a Colonoscopy and now has Covid. She wants the office to be aware of a possible exposure. ?Referrals ?GO TO FACILITY REFUSE ?

## 2022-02-19 NOTE — Telephone Encounter (Signed)
I spoke with pt; pt said the H/A, fever and body aches are all gone and last noticed on 02/18/22.pt said she does still have the prod cough and last night there was blood tinges phlegm but pt said she was not surprised because when she coughs it is a very hard cough. Pt said the tessalon helps the cough and she still has 6 of those left. Pt said if she needs more tessalon she will let Dr Darnell Level know because she had requested a refill of tessalon and [facility] [address] said we denied the refill. I advised pt that Dr Darnell Level did not get a refill request for tessalon and I spoke with Claiborne Billings at [address] and she said that Dr Marvia Pickles declined refill. I advised I did not know who that was. Pt said she specifically asked them to request refill from Dr Darnell Level. But pt said that was OK because her cough is doing better and pt thinks she will not need more now but if she does pt will call our office about refill. Pt started the Paxlovid on 02/16/22. Pt should finish the Paxlovid on 02/20/22. Pt said she does feel very tired and pt had been having wheezing but last night pt said she does not remember hearing the wheezing last night. Pt is taking mucinex which seems to help wheezing. Pt said she will cb if she does not continue to improve. Pt also wanted Dr Darnell Level to know that the colonoscopy pt had showed 4 benign polyps. Pt wanted DR G to know she appreciated him having a cb to ck on pt. Sending note to Dr Darnell Level with update. ?

## 2022-02-20 NOTE — Telephone Encounter (Signed)
Noted! Thank you

## 2022-03-04 ENCOUNTER — Encounter: Payer: Self-pay | Admitting: Gastroenterology

## 2022-03-15 DIAGNOSIS — Z124 Encounter for screening for malignant neoplasm of cervix: Secondary | ICD-10-CM | POA: Diagnosis not present

## 2022-03-15 DIAGNOSIS — Z0142 Encounter for cervical smear to confirm findings of recent normal smear following initial abnormal smear: Secondary | ICD-10-CM | POA: Diagnosis not present

## 2022-03-15 DIAGNOSIS — Z01419 Encounter for gynecological examination (general) (routine) without abnormal findings: Secondary | ICD-10-CM | POA: Diagnosis not present

## 2022-03-15 DIAGNOSIS — Z01411 Encounter for gynecological examination (general) (routine) with abnormal findings: Secondary | ICD-10-CM | POA: Diagnosis not present

## 2022-05-14 ENCOUNTER — Telehealth: Payer: Medicare PPO | Admitting: Emergency Medicine

## 2022-05-14 DIAGNOSIS — R21 Rash and other nonspecific skin eruption: Secondary | ICD-10-CM

## 2022-05-14 MED ORDER — DOXYCYCLINE HYCLATE 100 MG PO CAPS: 100.0000 mg | ORAL_CAPSULE | Freq: Two times a day (BID) | ORAL | 0 refills | Status: DC

## 2022-05-14 NOTE — Progress Notes (Signed)
Virtual Visit Consent   Cynthia Roberson, you are scheduled for a virtual visit with a [facility] provider today. Just as with appointments in the office, your consent must be obtained to participate. Your consent will be active for this visit and any virtual visit you may have with one of our providers in the next 365 days. If you have a MyChart account, a copy of this consent can be sent to you electronically.  As this is a virtual visit, video technology does not allow for your provider to perform a traditional examination. This may limit your provider's ability to fully assess your condition. If your provider identifies any concerns that need to be evaluated in person or the need to arrange testing (such as labs, EKG, etc.), we will make arrangements to do so. Although advances in technology are sophisticated, we cannot ensure that it will always work on either your end or our end. If the connection with a video visit is poor, the visit may have to be switched to a telephone visit. With either a video or telephone visit, we are not always able to ensure that we have a secure connection.  By engaging in this virtual visit, you consent to the provision of healthcare and authorize for your insurance to be billed (if applicable) for the services provided during this visit. Depending on your insurance coverage, you may receive a charge related to this service.  I need to obtain your verbal consent now. Are you willing to proceed with your visit today? Cynthia Roberson has provided verbal consent on 05/14/2022 for a virtual visit (video or telephone). Cynthia Circle, PA-C  Date: 05/14/2022 11:10 AM  Virtual Visit via Video Note   I, Cynthia Roberson, connected with  Cynthia Roberson  ([id number], Apr 06, 1951) on 05/14/22 at 11:15 AM EDT by a video-enabled telemedicine application and verified that I am speaking with the correct person using two identifiers.  Location: Patient: Virtual Visit Location Patient:  Home Provider: Virtual Visit Location Provider: Home Office   I discussed the limitations of evaluation and management by telemedicine and the availability of in person appointments. The patient expressed understanding and agreed to proceed.    History of Present Illness: Cynthia Roberson is a 71 y.o. who identifies as a female who was assigned female at birth, and is being seen today for spider bite.  She states that she was bitten by something on Friday night.  States that it started out as an itch.  States that it is up near her left groin.  (Not visualized on camera).  States that is about the size of a quarter. She states that it doesn't itch or hurt. She denies arthralgias or myalgias.  HPI: HPI  Problems:  Patient Active Problem List   Diagnosis Date Noted   Poor sleep hygiene 12/14/2020   Right ankle pain 11/16/2018   Tinnitus aurium, bilateral 05/09/2018   Vitamin D deficiency 01/10/2018   Severe obesity (BMI 35.0-39.9) with comorbidity ([place]) 12/09/2017   ETD (Eustachian tube dysfunction), right 11/14/2017   Advanced care planning/counseling discussion 11/11/2016   Polycythemia, secondary 11/11/2016   Dyslipidemia 11/05/2016   Routine general medical examination at a health care facility 07/28/2015   Prediabetes 07/27/2015   Neuropathy of left upper extremity 02/19/2015   Carpal tunnel syndrome of left wrist 09/28/2014   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   Statins Palpitations    Tried Crestor and others   Sulfa Antibiotics Rash   Medications:  Current Outpatient Medications:    Accu-Chek Softclix Lancets lancets, Use once a day,  Disp: 100 each, Rfl: 3   Biotin 5 MG TABS, Take 5 mg by mouth daily. , Disp: , Rfl:    Blood Glucose Monitoring Suppl (ACCU-CHEK GUIDE ME) w/Device KIT, Use as advised, Disp: 1 kit, Rfl: 1   Cholecalciferol (VITAMIN D-3) 125 MCG (5000 UT) TABS, Take 1 tablet by mouth daily., Disp: 30 tablet, Rfl:    COLLAGEN PO, Take by mouth., Disp: , Rfl:    COVID-19 mRNA Vac-TriS, Pfizer, (PFIZER-BIONT COVID-19 VAC-TRIS) SUSP injection, Inject into the muscle., Disp: 0.3 mL, Rfl: 0   cycloSPORINE (RESTASIS) 0.05 % ophthalmic emulsion, Place 1 drop into both eyes 2 (two) times daily., Disp: , Rfl:    Famotidine (PEPCID PO), Take by mouth as needed. OTC, Disp: , Rfl:    fluticasone (FLONASE) 50 MCG/ACT nasal spray, Place 2 sprays into both nostrils daily., Disp: , Rfl:    glucose blood (ACCU-CHEK GUIDE) test strip, Use once a day, rotating check times, Disp: 100 each, Rfl: 3   Lactase (LACTAID PO), Take by mouth as needed., Disp: , Rfl:    loratadine (CLARITIN) 10 MG tablet, Take 10 mg by mouth daily as needed. , Disp: , Rfl:    Menaquinone-7 (VITAMIN K2 PO), Take by mouth daily., Disp: , Rfl:    nebivolol (BYSTOLIC) 2.5 MG tablet, TAKE 1 TABLET BY MOUTH DAILY, Disp: 90 tablet, Rfl: 3   NONFORMULARY OR COMPOUNDED ITEM, 2 capsules 2 (two) times daily. Hydro-eye, Disp: , Rfl:    Probiotic Product (PROBIOTIC PO), Take by mouth daily., Disp: , Rfl:    Saw Palmetto, Serenoa repens, (SAW PALMETTO PO), Take by mouth daily., Disp: , Rfl:    spironolactone (ALDACTONE) 25 MG tablet, Take 1 tablet (25 mg total) by mouth 2 (two) times daily. (Patient not taking: Reported on 01/30/2022), Disp: 180 tablet, Rfl: 3   SYNTHROID 50 MCG tablet, TAKE 1 TABLET BY MOUTH EVERY MORNING BEFORE BREAKFAST, Disp: 90 tablet, Rfl: 3   vitamin B-12 (CYANOCOBALAMIN) 500 MCG tablet, Take 500 mcg by mouth daily., Disp: , Rfl:    vitamin E 400 UNIT capsule, Take 400 Units by mouth daily., Disp: , Rfl:    Zinc 50 MG TABS, Take 1 tablet by mouth  daily., Disp: , Rfl:   Current Facility-Administered Medications:    0.9 %  sodium chloride infusion, 500 mL, Intravenous, Once, Ladene Artist, MD  Observations/Objective: Patient is well-developed, well-nourished in no acute distress.  Resting comfortably  at home.  Head is normocephalic, atraumatic.  No labored breathing.  Speech is clear and coherent with logical content.  Patient is alert and oriented at baseline.  Slightly larger than dime sized area of erythema as pictured on her phone.   Assessment and Plan: 1. Rash 2. Possible insect bite  Will trial doxy.  Doesn't appear to be cellulitic or abscessed.  Patient reports some central clearing, will cover with doxy to cover tickborne illness as well.  Follow Up Instructions: I discussed the assessment and treatment plan with the patient. The patient was provided an opportunity to ask questions and all were answered. The patient agreed with the plan and demonstrated an understanding of the  instructions.  A copy of instructions were sent to the patient via MyChart unless otherwise noted below.    The patient was advised to call back or seek an in-person evaluation if the symptoms worsen or if the condition fails to improve as anticipated.  Time:  I spent 13 minutes with the patient via telehealth technology discussing the above problems/concerns.    Cynthia Circle, PA-C

## 2022-05-28 DIAGNOSIS — T07XXXA Unspecified multiple injuries, initial encounter: Secondary | ICD-10-CM | POA: Diagnosis not present

## 2022-05-28 DIAGNOSIS — D1801 Hemangioma of skin and subcutaneous tissue: Secondary | ICD-10-CM | POA: Diagnosis not present

## 2022-05-28 DIAGNOSIS — L821 Other seborrheic keratosis: Secondary | ICD-10-CM | POA: Diagnosis not present

## 2022-08-14 ENCOUNTER — Encounter: Payer: Self-pay | Admitting: Family Medicine

## 2022-08-14 ENCOUNTER — Ambulatory Visit: Payer: Medicare PPO | Admitting: Family Medicine

## 2022-08-14 VITALS — BP 134/82 | HR 76 | Temp 98.0°F | Ht 62.0 in | Wt 202.2 lb

## 2022-08-14 DIAGNOSIS — Z23 Encounter for immunization: Secondary | ICD-10-CM

## 2022-08-14 DIAGNOSIS — I10 Essential (primary) hypertension: Secondary | ICD-10-CM

## 2022-08-14 DIAGNOSIS — E282 Polycystic ovarian syndrome: Secondary | ICD-10-CM

## 2022-08-14 DIAGNOSIS — R7303 Prediabetes: Secondary | ICD-10-CM | POA: Diagnosis not present

## 2022-08-14 DIAGNOSIS — E039 Hypothyroidism, unspecified: Secondary | ICD-10-CM | POA: Diagnosis not present

## 2022-08-14 DIAGNOSIS — E559 Vitamin D deficiency, unspecified: Secondary | ICD-10-CM | POA: Diagnosis not present

## 2022-08-14 MED ORDER — VITAMIN D-3 125 MCG (5000 UT) PO TABS: 1.0000 | ORAL_TABLET | Freq: Every day | ORAL | Status: AC

## 2022-08-14 NOTE — Assessment & Plan Note (Signed)
She has upcoming endo appt - would prefer A1c through her.

## 2022-08-14 NOTE — Patient Instructions (Addendum)
Flu shot today  You are doing well today  Return as needed or in 6 months for physical/wellness visit

## 2022-08-14 NOTE — Assessment & Plan Note (Signed)
Appreciate endo care.  

## 2022-08-14 NOTE — Progress Notes (Unsigned)
Patient ID: Cynthia Roberson, female    DOB: 07-23-1951, 71 y.o.   MRN: [id number]  This visit was conducted in person.  BP 134/82   Pulse 76   Temp 98 F (36.7 C) (Temporal)   Ht _0  (1.575 m)   Wt 202 lb 4 oz (91.7 kg)   SpO2 94%   BMI 36.99 kg/m    CC: 6 mo f/u visit  Subjective:   HPI: Cynthia Roberson is a 71 y.o. female presenting on 08/14/2022 for Follow-up (Here for 6 mo f/u.  Pt accompanied by husband, Mark. )   COVID infection 01/2022 - symptoms fully resolved.   Sees endo Gherghe for PCOS and hypothyroidism. She stopped spironolactone - didn't tolerate this well. Stopped metformin due to diarrhea. She has upcoming endo appt next month so labs deferred today.   She's started using Garden of Life vitamin D 5000 IU capsules daily and feels this brand has been more effective than regular OTC.   She and husband ask about assistance with weight loss.      Relevant past medical, surgical, family and social history reviewed and updated as indicated. Interim medical history since our last visit reviewed. Allergies and medications reviewed and updated. Outpatient Medications Prior to Visit  Medication Sig Dispense Refill   Accu-Chek Softclix Lancets lancets Use once a day 100 each 3   b complex vitamins capsule Take 1 capsule by mouth daily.     Blood Glucose Monitoring Suppl (ACCU-CHEK GUIDE ME) w/Device KIT Use as advised 1 kit 1   COLLAGEN PO Take by mouth.     cycloSPORINE (RESTASIS) 0.05 % ophthalmic emulsion Place 1 drop into both eyes 2 (two) times daily.     Famotidine (PEPCID PO) Take by mouth as needed. OTC     fluticasone (FLONASE) 50 MCG/ACT nasal spray Place 2 sprays into both nostrils daily.     glucose blood (ACCU-CHEK GUIDE) test strip Use once a day, rotating check times 100 each 3   loratadine (CLARITIN) 10 MG tablet Take 10 mg by mouth daily as needed.      Menaquinone-7 (VITAMIN K2 PO) Take by mouth daily.     nebivolol (BYSTOLIC) 2.5 MG tablet TAKE 1  TABLET BY MOUTH DAILY 90 tablet 3   NONFORMULARY OR COMPOUNDED ITEM 2 capsules 2 (two) times daily. Hydro-eye     Probiotic Product (PROBIOTIC PO) Take by mouth daily.     Saw Palmetto, Serenoa repens, (SAW PALMETTO PO) Take by mouth daily.     SYNTHROID 50 MCG tablet TAKE 1 TABLET BY MOUTH EVERY MORNING BEFORE BREAKFAST 90 tablet 3   vitamin E 400 UNIT capsule Take 400 Units by mouth daily.     Zinc 50 MG TABS Take 1 tablet by mouth daily.     Cholecalciferol (VITAMIN D-3) 125 MCG (5000 UT) TABS Take 1 tablet by mouth daily. 30 tablet    Biotin 5 MG TABS Take 5 mg by mouth daily.      COVID-19 mRNA Vac-TriS, Pfizer, (PFIZER-BIONT COVID-19 VAC-TRIS) SUSP injection Inject into the muscle. 0.3 mL 0   doxycycline (VIBRAMYCIN) 100 MG capsule Take 1 capsule (100 mg total) by mouth 2 (two) times daily. 20 capsule 0   Lactase (LACTAID PO) Take by mouth as needed.     spironolactone (ALDACTONE) 25 MG tablet Take 1 tablet (25 mg total) by mouth 2 (two) times daily. (Patient not taking: Reported on 01/30/2022) 180 tablet 3   vitamin B-12 (CYANOCOBALAMIN) 500 MCG  tablet Take 500 mcg by mouth daily.     Facility-Administered Medications Prior to Visit  Medication Dose Route Frequency Provider Last Rate Last Admin   0.9 %  sodium chloride infusion  500 mL Intravenous Once Ladene Artist, MD         Per HPI unless specifically indicated in ROS section below Review of Systems  Objective:  BP 134/82   Pulse 76   Temp 98 F (36.7 C) (Temporal)   Ht _0  (1.575 m)   Wt 202 lb 4 oz (91.7 kg)   SpO2 94%   BMI 36.99 kg/m   Wt Readings from Last 3 Encounters:  08/14/22 202 lb 4 oz (91.7 kg)  02/12/22 195 lb (88.5 kg)  02/11/22 200 lb (90.7 kg)      Physical Exam Vitals and nursing note reviewed.  Constitutional:      Appearance: Normal appearance. She is not ill-appearing.  HENT:     Mouth/Throat:     Mouth: Mucous membranes are moist.     Pharynx: Oropharynx is clear. No oropharyngeal  exudate or posterior oropharyngeal erythema.  Eyes:     Extraocular Movements: Extraocular movements intact.     Pupils: Pupils are equal, round, and reactive to light.  Cardiovascular:     Rate and Rhythm: Normal rate and regular rhythm.     Pulses: Normal pulses.     Heart sounds: Normal heart sounds. No murmur heard. Pulmonary:     Effort: Pulmonary effort is normal. No respiratory distress.     Breath sounds: Normal breath sounds. No wheezing, rhonchi or rales.  Musculoskeletal:     Right lower leg: No edema.     Left lower leg: No edema.  Skin:    General: Skin is warm and dry.  Neurological:     Mental Status: She is alert.  Psychiatric:        Mood and Affect: Mood normal.        Behavior: Behavior normal.       Results for orders placed or performed in visit on 02/15/22  HM MAMMOGRAPHY  Result Value Ref Range   HM Mammogram 0-4 Bi-Rad 0-4 Bi-Rad, Self Reported Normal   Last vitamin D Lab Results  Component Value Date   VD25OH 66.03 01/31/2022    Assessment & Plan:   Problem List Items Addressed This Visit     PCOS (polycystic ovarian syndrome) - Primary    Appreciate endo care.       Hypothyroidism   Essential hypertension, benign    Chronic, stable on nebivolol 2.34m daily      Prediabetes    She has upcoming endo appt - would prefer A1c checked at that time.       Severe obesity (BMI 35.0-39.9) with comorbidity [place]    They ask about assistance with weight loss. [facility] - they will research this clinic and let me know if desire referral.  Obesity complicated by dyslipidemia, prediabetes, hypertension, PCOS.       Vitamin D deficiency    Continues 5000 IU daily via Garden of Life supplement brand.       Other Visit Diagnoses     Need for influenza vaccination       Relevant Orders   Flu Vaccine QUAD High Dose(Fluad) (Completed)        Meds ordered this encounter  Medications   Cholecalciferol  (VITAMIN D-3) 125 MCG (5000 UT) TABS    Sig:  Take 1 tablet by mouth daily.    Garden of Life brand   Orders Placed This Encounter  Procedures   Flu Vaccine QUAD High Dose(Fluad)     Patient Instructions  Flu shot today  You are doing well today  Return as needed or in 6 months for physical/wellness visit   Follow up plan: Return in about 6 months (around 02/13/2023) for annual exam, prior fasting for blood work, medicare wellness visit.  Ria Bush, MD

## 2022-08-14 NOTE — Assessment & Plan Note (Signed)
Chronic, stable on nebivolol 2.'5mg'$  daily

## 2022-08-15 NOTE — Assessment & Plan Note (Signed)
They ask about assistance with weight loss. Info provided [facility] - they will research this clinic and let me know if desire referral.  Obesity complicated by dyslipidemia, prediabetes, hypertension, PCOS.

## 2022-08-15 NOTE — Assessment & Plan Note (Signed)
Continues 5000 IU daily via Garden of Life supplement brand.

## 2022-09-02 ENCOUNTER — Ambulatory Visit: Payer: Medicare PPO | Admitting: Internal Medicine

## 2022-09-02 ENCOUNTER — Encounter: Payer: Self-pay | Admitting: Internal Medicine

## 2022-09-02 VITALS — BP 130/86 | HR 70 | Ht 62.0 in | Wt 202.2 lb

## 2022-09-02 DIAGNOSIS — R7303 Prediabetes: Secondary | ICD-10-CM

## 2022-09-02 DIAGNOSIS — E281 Androgen excess: Secondary | ICD-10-CM | POA: Diagnosis not present

## 2022-09-02 DIAGNOSIS — E039 Hypothyroidism, unspecified: Secondary | ICD-10-CM | POA: Diagnosis not present

## 2022-09-02 DIAGNOSIS — D751 Secondary polycythemia: Secondary | ICD-10-CM | POA: Diagnosis not present

## 2022-09-02 LAB — HEMOGLOBIN A1C: Hgb A1c MFr Bld: 6.3 % (ref 4.6–6.5)

## 2022-09-02 LAB — TSH: TSH: 2.98 u[IU]/mL (ref 0.35–5.50)

## 2022-09-02 NOTE — Progress Notes (Signed)
Patient ID: Cynthia Roberson, female   DOB: Jul 15, 1951, 71 y.o.   MRN: [id number]  HPI  Cynthia Roberson is a 71 y.o.-year-old female, returning for post menopausal hyperandrogenism, hypothyroidism, prediabetes. Last visit 1 year ago.  Interim history: Has numbness and tingling in L hand. She has carpal tunnel. She developed diarrhea from metformin >> off for 1 year. She stopped spironolactone since last visit. She had Covid19 01/2022. This was a mild form. She has mild SOB, ankle pain -cannot walk much.  At last appointment with PCP, he suggested the weight management clinic.  However, she wants to try dieting by herself. She started to use rice water spray on forehead >> feels she has new hair pulling.  Reviewed history: Patient had a long history of hyperandrogenism (high testosterone level), and has a history of PCOS + infertility. She could not tolerate OCPs in the past (migraines).   The patient has a history of scalp hair thinning since her younger years, for which she has been using prescription strength, Rogaine with good results. She continues on minoxidil for scalp hair loss.  Also, saw palmetto, and rice water spray.  No acne.  She is postmenopausal since ~ age 39. She had a always had a history of irregular menses and had been on birth control in the past. She had difficulty with conception and had used Clomid. She has had 2 successful pregnancies in the past. Following this, the patient had regular menses for some time until she attained menopause.  No signs of masculinization.  Testosterone level - elevated at 130 in 2013.  Subsequent levels were still high, however, all of the levels below were drawn while on spironolactone. We stopped checking her testosterone levels after 03/2015, when she was on spironolactone Component     Latest Ref Rng 07/06/2014 07/14/2014 10/11/2014 04/26/2015  Testosterone     3 - 41 ng/dL 173 (H)  97 (H)   Sex Hormone Binding     18 - 114 nmol/L 31      Testosterone Free     0.0 - 4.2 pg/mL 33.8 (H)  6.1 (H)   Testosterone-% Free     0.4 - 2.4 % 2.0     FSH      26.1     LH      20.3     DHEA-SO4     29.4 - 220.5 ug/dL  77.0    Testosterone, total     7.0 - 40.0 ng/dL    122.7 (H)   Reviewed previous work-up: - Ruled out for Cushing's syndrome based on 1 mg dex suppression test- morning cortisol is 1.47 (05/2013) - DHEAS 44.3, Prolactin 5.9 (05/2013) - CT Abdomen and pelvis with contrast (04/2013) - No adrenal mass or any ovarian abnormality - Ovarian US was negative (06/2014) At last visit with Dr.Phadke, the use of flutamide was discussed, as well as the possibility of oophorectomy.  She is also seeing Dr Ronita Hipps [facility]) and Dr Ubaldo Glassing (derm).  She has been on spironolactone since 05/2013 with mild elevation in her potassium levels so the dose of spironolactone was maintained low, 25 mg daily. In 2018 we decided to stop spironolactone. She felt better afterwards. She noticed significantly less hair on chin especially after she started waxing every 3 weeks.   At our visit in 08/2021, she felt that her hirsutism worsened. We restarted spironolactone - 25 mg twice a day.  She stopped recently 2/2 feeling poorly, especially after she increased the dose to twice  a day.  She continues with spearmint tea.  Testosterone levels reviewed: Component     Latest Ref Rng & Units 09/07/2021  Testosterone, Serum (Total)     ng/dL 175 (H)  % Free Testosterone     % 0.8  Free Testosterone, S     pg/mL 14 (H)  Sex Hormone Binding Globulin     nmol/L 55.8  Free testosterone level = higher.    Component     Latest Ref Rng & Units 09/04/2020  Testosterone, Serum (Total)     ng/dL 109 (H)  % Free Testosterone     % 0.9  Free Testosterone, S     pg/mL 9.8 (H)  Sex Hormone Binding Globulin     nmol/L 43.2  Hemoglobin A1C     4.6 - 6.5 % 6.1  TSH     0.35 - 4.50 uIU/mL 2.30  T4,Free(Direct)     0.60 - 1.60 ng/dL 1.09  DHEA-Sulfate,  LCMS     Ug/dL Reference Range:  Adult Females (61 - 70y): <128  68   Prev.: Component     Latest Ref Rng & Units 07/24/2018 09/01/2019  Testosterone, Serum (Total)     ng/dL 92 (H) 121 (H)  % Free Testosterone     % 1.2 1.2  Free Testosterone, S     pg/mL 11 (H) 15 (H)  Sex Hormone Binding Globulin     nmol/L 50.3 46.5   On spironolactone: Component     Latest Ref Rng & Units 07/24/2017  Testosterone     3 - 41 ng/dL 82 (H)  Testosterone Free     0.0 - 4.2 pg/mL 2.8  Sex Horm Binding Glob, Serum     17.3 - 125.0 nmol/L 50.8   Reviewed potassium levels: Lab Results  Component Value Date   K 4.9 01/31/2022   K 4.6 10/08/2021   K 4.0 09/07/2021   K 4.7 12/06/2020   K 5.1 Hemolysis seen 11/17/2019   Hypothyroidism, well controlled - dx in 1995 -She continues on Synthroid d.a.w. 50 mcg daily, stable dose for the last 17 years -We tried to switch to generic levothyroxine but she could not tolerate it due to palpitations.  Pt takes Synthroid: - in am - fasting - at least 30 min from b'fast - no calcium - no iron - no multivitamins - no PPIs - not on Biotin  Latest TSH was normal: Lab Results  Component Value Date   TSH 2.16 01/31/2022   Prediabetes.  - She did not tolerate  regular metformin in the past >> Metformin ER 1000 mg daily with brunch >> had to stop at the end of 2022 due to diarrhea.  Currently off diabetic medications.  HbA1c levels are in the prediabetic range: Lab Results  Component Value Date   HGBA1C 6.4 09/07/2021   HGBA1C 5.8 (A) 12/11/2020   HGBA1C 6.1 09/04/2020   HGBA1C 5.9 11/17/2019   HGBA1C 5.5 09/01/2019   HGBA1C 6.0 07/24/2018   HGBA1C 5.9 11/07/2017   HGBA1C 5.8 07/24/2017   HGBA1C 5.9 02/21/2017   HGBA1C 5.9 07/25/2016   HGBA1C 6.1 01/23/2016   HGBA1C 6.0 02/09/2015   HGBA1C 6.3 10/11/2014   HGBA1C 6.2 07/06/2014   HGBA1C 5.7 01/11/2014   HGBA1C 5.6 06/10/2013   She started ASA 81 per Dr. Alvy Bimler >> bruising, petechiae  >> stopped.  ROS: + see HPI  I reviewed pt's medications, allergies, PMH, social hx, family hx, and changes were documented in the history  of present illness. Otherwise, unchanged from my initial visit note.  Past Medical History:  Diagnosis Date   Allergy    Arthritis    Clostridium difficile infection 2013   Hirsutism    Hyperandrogenemia    Hypertension    Osteopenia    DEXA 2016   Other alopecia    Polycystic ovaries    Tubular adenoma of colon 05/25/2015   Unspecified hypothyroidism    Past Surgical History:  Procedure Laterality Date   COLONOSCOPY  04/2015   TA, diverticulosis, rpt 5 yrs ([facility])   COLONOSCOPY  2011   adenomatous polyp, rpt 5 yrs ([place])   COLONOSCOPY  01/2022   4 TAs, mild diverticulosis, rpt 3 yrs [facility])   POLYPECTOMY     TONSILLECTOMY     WISDOM TOOTH EXTRACTION     Social History   Social History   Marital Status: Married    Spouse Name: N/A   Number of Children: 2   Occupational History   Retired Pharmacist, hospital, now Counsellor   Social History Main Topics   Smoking status: Never Smoker    Smokeless tobacco: Not on file   Alcohol Use:     1-3 Glasses of wine per year   Drug Use: No   Sexual Activity: Yes   Current Outpatient Medications on File Prior to Visit  Medication Sig Dispense Refill   Accu-Chek Softclix Lancets lancets Use once a day 100 each 3   b complex vitamins capsule Take 1 capsule by mouth daily.     Blood Glucose Monitoring Suppl (ACCU-CHEK GUIDE ME) w/Device KIT Use as advised 1 kit 1   Cholecalciferol (VITAMIN D-3) 125 MCG (5000 UT) TABS Take 1 tablet by mouth daily.     COLLAGEN PO Take by mouth.     cycloSPORINE (RESTASIS) 0.05 % ophthalmic emulsion Place 1 drop into both eyes 2 (two) times daily.     Famotidine (PEPCID PO) Take by mouth as needed. OTC     fluticasone (FLONASE) 50 MCG/ACT nasal spray Place 2 sprays into both nostrils daily.     glucose blood (ACCU-CHEK GUIDE) test strip Use once a  day, rotating check times 100 each 3   loratadine (CLARITIN) 10 MG tablet Take 10 mg by mouth daily as needed.      Menaquinone-7 (VITAMIN K2 PO) Take by mouth daily.     nebivolol (BYSTOLIC) 2.5 MG tablet TAKE 1 TABLET BY MOUTH DAILY 90 tablet 3   NONFORMULARY OR COMPOUNDED ITEM 2 capsules 2 (two) times daily. Hydro-eye     Probiotic Product (PROBIOTIC PO) Take by mouth daily.     Saw Palmetto, Serenoa repens, (SAW PALMETTO PO) Take by mouth daily.     SYNTHROID 50 MCG tablet TAKE 1 TABLET BY MOUTH EVERY MORNING BEFORE BREAKFAST 90 tablet 3   vitamin E 400 UNIT capsule Take 400 Units by mouth daily.     Zinc 50 MG TABS Take 1 tablet by mouth daily.     Current Facility-Administered Medications on File Prior to Visit  Medication Dose Route Frequency Provider Last Rate Last Admin   0.9 %  sodium chloride infusion  500 mL Intravenous Once Ladene Artist, MD       Allergies  Allergen Reactions   Amoxicillin Rash   Levothyroxine Palpitations    With generic levothyroxine, tolerates well the brand name.   Statins Palpitations    Tried Crestor and others   Sulfa Antibiotics Rash   Family History  Problem Relation Age  of Onset   Heart failure Mother    Cancer Mother 72       lung   Heart disease Father    Stroke Father    Hypertension Father    Rectal cancer Paternal Grandfather    Stomach cancer Neg Hx    Colon cancer Neg Hx    PE: BP 130/86 (BP Location: Left Arm, Patient Position: Sitting, Cuff Size: Normal)   Pulse 70   Ht _0  (1.575 m)   Wt 202 lb 3.2 oz (91.7 kg)   SpO2 99%   BMI 36.98 kg/m  Wt Readings from Last 3 Encounters:  09/02/22 202 lb 3.2 oz (91.7 kg)  08/14/22 202 lb 4 oz (91.7 kg)  02/12/22 195 lb (88.5 kg)   Constitutional: overweight, in NAD Eyes: PERRLA, EOMI, no exophthalmos ENT: no thyromegaly, no cervical lymphadenopathy Cardiovascular: RRR, No MRG Respiratory: CTA B Musculoskeletal: no deformities, strength intact in all 4 Skin: + female  pattern baldness, hirsutism on chin Neurological: no tremor with outstretched hands  ASSESSMENT: 1. Postmenopausal hyperandrogenism - She discussed with Dr. Howell Rucks and Dr. Ronita Hipps before about the possibility of performing oophorectomy to look for ovarian hyperthecosis or small hilar ovarian tumors. She refused. We are following her clinically and biochemically.  2. Hypothyroidism  3. Prediabetes  PLAN:  1. Postmenopausal hyperandrogenism -likely due to pre-existing PCOS Patient with history of hyperandrogenism due to most likely previously undiagnosed PCOS or possibly secondary to insulin resistance or less likely).  Adrenal tumors, ovarian tumors, or ovarian hyperthecosis are less likely for her since these occurred with usually higher levels of testosterone, higher than 150 ng/mL.  Patient had repeated CT imaging of her adrenals, and also transvaginal ultrasound for evaluation of her ovaries and these tests were all negative for any masses or hyperplasia.  Her hormonal testing was also negative for hyperprolactinemia, pituitary dysfunction, Cushing syndrome. -We initially started her on spironolactone but then stopped to see if she absolutely needed it.  She felt better after stopping it.  A testosterone level was high after stopping spironolactone but is was slightly better on recheck.  She started spearmint tea in an effort to decrease her testosterone levels.  At last visit she was interested in restarting spironolactone.  We checked her testosterone again and this was slightly higher.  Potassium was normal.  We restarted spironolactone, which target dose of 25 mg twice a day.  However, she recently stopped this patient was feeling poorly especially the higher dose. 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  Also, spearmint tea, saw palmetto, rice water spray.  She waxes her chin. -We did discuss in the past about proceeding with bilateral oophorectomy and  at last visit she was planning to discuss with Dr. Ronita Hipps about it.  At this point, plan is to continue without oophorectomy -I will see her back in 1 year  2. Hypothyroidism - latest thyroid labs reviewed with pt. >> normal: Lab Results  Component Value Date   TSH 2.16 01/31/2022  - she continues on LT4 (Synthroid d.a.w.) 50 mcg daily - pt feels good on this dose. - we discussed about taking the thyroid hormone every day, with water, >30 minutes before breakfast, separated by >4 hours from acid reflux medications, calcium, iron, multivitamins. Pt. is taking it correctly. -We will recheck her TFTs today  3. Prediabetes -She continues on metformin ER 1000 mg daily.  She had diarrhea with this and stopped more than a year  ago. -As of now, she is not on any diabetic medications. -PCP referred her to the weight management clinic but she would like to try to diet by herself.  She accumulated several cookbooks which she is planning to start using. -Latest HbA1c was 6.4%, higher.  At that time, she stopped exercising but was planning to restart walking on the treadmill.  Also, she has a [employer] at home. -We will repeat her HbA1c today  Component     Latest Ref Rng 09/02/2022  Hemoglobin A1C     4.6 - 6.5 % 6.3   TSH     0.35 - 5.50 uIU/mL 2.98   Testosterone, Serum (Total)     ng/dL 125 (H)   % Free Testosterone     % 1.3   Free Testosterone, S     pg/mL 16 (H)   Sex Hormone Binding Globulin     nmol/L 39.4   HbA1C is slightly lower. TSH is normal. Total testosterone is still high, but lower than before.  Free testosterone is approximately the same as before.  Philemon Kingdom, MD PhD [facility] Endocrinology

## 2022-09-02 NOTE — Patient Instructions (Addendum)
Please stop at the lab.  Please continue Synthroid 50 mcg daily.  Take the thyroid hormone every day, with water, at least 30 minutes before breakfast, separated by at least 4 hours from: - acid reflux medications - calcium - iron - multivitamins  Please come back for a follow-up appointment in 1 year.

## 2022-09-09 ENCOUNTER — Ambulatory Visit: Payer: Medicare PPO | Admitting: Internal Medicine

## 2022-09-10 LAB — TESTOSTERONE, FREE AND TOTAL (INCLUDES SHBG)-(MALES)
% Free Testosterone: 1.3 %
Free Testosterone, S: 16 pg/mL — ABNORMAL HIGH
Sex Hormone Binding Globulin: 39.4 nmol/L
Testosterone, Serum (Total): 125 ng/dL — ABNORMAL HIGH

## 2022-09-11 ENCOUNTER — Other Ambulatory Visit: Payer: Self-pay | Admitting: Internal Medicine

## 2022-10-18 ENCOUNTER — Telehealth: Payer: Medicare PPO | Admitting: Emergency Medicine

## 2022-10-18 DIAGNOSIS — B9689 Other specified bacterial agents as the cause of diseases classified elsewhere: Secondary | ICD-10-CM | POA: Diagnosis not present

## 2022-10-18 DIAGNOSIS — J019 Acute sinusitis, unspecified: Secondary | ICD-10-CM

## 2022-10-18 MED ORDER — DOXYCYCLINE HYCLATE 100 MG PO TABS: 100.0000 mg | ORAL_TABLET | Freq: Two times a day (BID) | ORAL | 0 refills | Status: AC

## 2022-10-18 NOTE — Progress Notes (Signed)
E-Visit for Sinus Problems  We are sorry that you are not feeling well.  Here is how we plan to help!  Based on what you have shared with me it looks like you have sinusitis.  Sinusitis is inflammation and infection in the sinus cavities of the head.  Based on your presentation I believe you most likely have Acute Bacterial Sinusitis.  This is an infection caused by bacteria and is treated with antibiotics. I have prescribed Doxycycline '100mg'$  by mouth twice a day for 10 days.   You may use an oral decongestant such as Mucinex D or if you have glaucoma or high blood pressure use plain Mucinex.   Saline nasal spray help and can safely be used as often as needed for congestion.  Try using saline irrigation, such as with a neti pot, several times a day while you are sick. Many neti pots come with salt packets premeasured to use to make saline. If you use your own salt, make sure it is kosher salt or sea salt (don't use table salt as it has iodine in it and you don't need that in your nose). Use distilled water to make saline. If you mix your own saline using your own salt, the recipe is 1/4 teaspoon salt in 1 cup warm water. Using saline irrigation can help prevent and treat sinus infections.   If you develop worsening sinus pain, fever or notice severe headache and vision changes, or if symptoms are not better after completion of antibiotic, please schedule an appointment with a health care provider.    Sinus infections are not as easily transmitted as other respiratory infection, however we still recommend that you avoid close contact with loved ones, especially the very young and elderly.  Remember to wash your hands thoroughly throughout the day as this is the number one way to prevent the spread of infection!  Home Care: Only take medications as instructed by your medical team. Complete the entire course of an antibiotic. Do not take these medications with alcohol. A steam or ultrasonic  humidifier can help congestion.  You can place a towel over your head and breathe in the steam from hot water coming from a faucet. Avoid close contacts especially the very young and the elderly. Cover your mouth when you cough or sneeze. Always remember to wash your hands.  Get Help Right Away If: You develop worsening fever or sinus pain. You develop a severe head ache or visual changes. Your symptoms persist after you have completed your treatment plan.  Make sure you Understand these instructions. Will watch your condition. Will get help right away if you are not doing well or get worse.  Thank you for choosing an e-visit.  Your e-visit answers were reviewed by a board certified advanced clinical practitioner to complete your personal care plan. Depending upon the condition, your plan could have included both over the counter or prescription medications.  Please review your pharmacy choice. Make sure the pharmacy is open so you can pick up prescription now. If there is a problem, you may contact your provider through [employer] and have the prescription routed to another pharmacy.  Your safety is important to [place]. If you have drug allergies check your prescription carefully.   For the next 24 hours you can use MyChart to ask questions about today's visit, request a non-urgent call back, or ask for a work or school excuse. You will get an email in the next two days asking about  your experience. I hope that your e-visit has been valuable and will speed your recovery.  I have spent 5 minutes in review of e-visit questionnaire, review and updating patient chart, medical decision making and response to patient.   Willeen Cass, PhD, FNP-BC

## 2022-12-19 ENCOUNTER — Telehealth: Payer: Self-pay

## 2022-12-19 NOTE — Patient Outreach (Signed)
  Care Coordination   12/19/2022 Name: Cynthia Roberson MRN: [id number] DOB: 22-Oct-1951   Care Coordination Outreach Attempts:  An unsuccessful telephone outreach was attempted today to offer the patient information about available care coordination services as a benefit of their health plan. Unable to reach patient. HIPAA compliant voice message left.   Follow Up Plan:  Additional outreach attempts will be made to offer the patient care coordination information and services.   Encounter Outcome:  No Answer   Care Coordination Interventions:  No, not indicated    Quinn Plowman [facility] Yoana Staib [phone] direct line

## 2023-01-03 ENCOUNTER — Telehealth: Payer: Self-pay

## 2023-01-03 NOTE — Patient Outreach (Addendum)
  Care Coordination   01/03/2023 Name: Cynthia Roberson MRN: [id number] DOB: [birth year]   Care Coordination Outreach Attempts:  An unsuccessful telephone outreach was attempted today to offer the patient information about available care coordination services as a benefit of their health plan.   Second call to patient.  HIPAA compliant message left.   Follow Up Plan:  Additional outreach attempts will be made to offer the patient care coordination information and services.   Encounter Outcome:  No Answer   Care Coordination Interventions:  No, not indicated    Quinn Plowman [facility] [phone] direct line

## 2023-01-13 ENCOUNTER — Other Ambulatory Visit: Payer: Self-pay | Admitting: Family Medicine

## 2023-01-13 DIAGNOSIS — I10 Essential (primary) hypertension: Secondary | ICD-10-CM

## 2023-01-15 ENCOUNTER — Telehealth: Payer: Medicare PPO | Admitting: Nurse Practitioner

## 2023-01-15 DIAGNOSIS — J0140 Acute pansinusitis, unspecified: Secondary | ICD-10-CM

## 2023-01-15 MED ORDER — DOXYCYCLINE HYCLATE 100 MG PO TABS: 100.0000 mg | ORAL_TABLET | Freq: Two times a day (BID) | ORAL | 0 refills | Status: AC

## 2023-01-15 NOTE — Progress Notes (Signed)
E-Visit for Sinus Problems  We are sorry that you are not feeling well.  Here is how we plan to help!  Based on what you have shared with me it looks like you have sinusitis.  Sinusitis is inflammation and infection in the sinus cavities of the head.  Based on your presentation I believe you most likely have Acute Bacterial Sinusitis.  This is an infection caused by bacteria and is treated with antibiotics. I have prescribed Doxycycline 100mg by mouth twice a day for 10 days. You may use an oral decongestant such as Mucinex D or if you have glaucoma or high blood pressure use plain Mucinex. Saline nasal spray help and can safely be used as often as needed for congestion.  If you develop worsening sinus pain, fever or notice severe headache and vision changes, or if symptoms are not better after completion of antibiotic, please schedule an appointment with a health care provider.    Sinus infections are not as easily transmitted as other respiratory infection, however we still recommend that you avoid close contact with loved ones, especially the very young and elderly.  Remember to wash your hands thoroughly throughout the day as this is the number one way to prevent the spread of infection!  Home Care: Only take medications as instructed by your medical team. Complete the entire course of an antibiotic. Do not take these medications with alcohol. A steam or ultrasonic humidifier can help congestion.  You can place a towel over your head and breathe in the steam from hot water coming from a faucet. Avoid close contacts especially the very young and the elderly. Cover your mouth when you cough or sneeze. Always remember to wash your hands.  Get Help Right Away If: You develop worsening fever or sinus pain. You develop a severe head ache or visual changes. Your symptoms persist after you have completed your treatment plan.  Make sure you Understand these instructions. Will watch your  condition. Will get help right away if you are not doing well or get worse.  Thank you for choosing an e-visit.  Your e-visit answers were reviewed by a board certified advanced clinical practitioner to complete your personal care plan. Depending upon the condition, your plan could have included both over the counter or prescription medications.  Please review your pharmacy choice. Make sure the pharmacy is open so you can pick up prescription now. If there is a problem, you may contact your provider through MyChart messaging and have the prescription routed to another pharmacy.  Your safety is important to us. If you have drug allergies check your prescription carefully.   For the next 24 hours you can use MyChart to ask questions about today's visit, request a non-urgent call back, or ask for a work or school excuse. You will get an email in the next two days asking about your experience. I hope that your e-visit has been valuable and will speed your recovery.   Meds ordered this encounter  Medications   doxycycline (VIBRA-TABS) 100 MG tablet    Sig: Take 1 tablet (100 mg total) by mouth 2 (two) times daily for 10 days.    Dispense:  20 tablet    Refill:  0    I spent approximately 5 minutes reviewing the patient's history, current symptoms and coordinating their care today.   

## 2023-01-29 DIAGNOSIS — Z1231 Encounter for screening mammogram for malignant neoplasm of breast: Secondary | ICD-10-CM | POA: Diagnosis not present

## 2023-01-29 LAB — HM MAMMOGRAPHY

## 2023-02-03 ENCOUNTER — Ambulatory Visit (INDEPENDENT_AMBULATORY_CARE_PROVIDER_SITE_OTHER): Payer: Medicare PPO

## 2023-02-03 VITALS — Ht 62.0 in | Wt 204.0 lb

## 2023-02-03 DIAGNOSIS — Z0000 Encounter for general adult medical examination without abnormal findings: Secondary | ICD-10-CM | POA: Diagnosis not present

## 2023-02-03 NOTE — Progress Notes (Signed)
I connected with  Cynthia Roberson on 02/03/23 by a audio enabled telemedicine application and verified that I am speaking with the correct person using two identifiers.  Patient Location: Home  Provider Location: Office/Clinic  I discussed the limitations of evaluation and management by telemedicine. The patient expressed understanding and agreed to proceed.  Subjective:   Cynthia Roberson is a 72 y.o. female who presents for Medicare Annual (Subsequent) preventive examination.  Review of Systems      Cardiac Risk Factors include: advanced age (>109men, >43 women);hypertension;sedentary lifestyle;dyslipidemia     Objective:    Today's Vitals   02/03/23 1135  Weight: 204 lb (92.5 kg)  Height: 5\' 2"  (1.575 m)   Body mass index is 37.31 kg/m.     02/03/2023   11:47 AM 01/30/2022   12:40 PM 01/30/2022   12:19 PM 11/21/2020    8:57 AM 11/17/2019   11:03 AM 11/12/2018    3:12 PM 11/07/2017    9:06 AM  Advanced Directives  Does Patient Have a Medical Advance Directive? No No No No No No No  Would patient like information on creating a medical advance directive? No - Patient declined No - Patient declined No - Patient declined No - Patient declined Yes (MAU/Ambulatory/Procedural Areas - Information given) No - Patient declined Yes (MAU/Ambulatory/Procedural Areas - Information given)    Current Medications (verified) Outpatient Encounter Medications as of 02/03/2023  Medication Sig   Accu-Chek Softclix Lancets lancets Use once a day   azelastine (ASTELIN) 0.1 % nasal spray Place into both nostrils 2 (two) times daily. Use in each nostril as directed   b complex vitamins capsule Take 1 capsule by mouth daily.   Blood Glucose Monitoring Suppl (ACCU-CHEK GUIDE ME) w/Device KIT Use as advised   Cholecalciferol (VITAMIN D-3) 125 MCG (5000 UT) TABS Take 1 tablet by mouth daily.   COLLAGEN PO Take by mouth.   cycloSPORINE (RESTASIS) 0.05 % ophthalmic emulsion Place 1 drop into both eyes 2 (two)  times daily.   Famotidine (PEPCID PO) Take by mouth as needed. OTC   glucose blood (ACCU-CHEK GUIDE) test strip Use once a day, rotating check times   lactase (LACTAID) 3000 units tablet Take by mouth 3 (three) times daily with meals.   loratadine (CLARITIN) 10 MG tablet Take 10 mg by mouth daily as needed.    Menaquinone-7 (VITAMIN K2 PO) Take by mouth daily.   nebivolol (BYSTOLIC) 2.5 MG tablet TAKE 1 TABLET BY MOUTH DAILY   NONFORMULARY OR COMPOUNDED ITEM 2 capsules 2 (two) times daily. Hydro-eye   Saw Palmetto, Serenoa repens, (SAW PALMETTO PO) Take by mouth daily.   Selenium 200 MCG CAPS Take by mouth.   SYNTHROID 50 MCG tablet TAKE 1 TABLET BY MOUTH EVERY MORNING BEFORE BREAKFAST   Zinc 50 MG TABS Take 1 tablet by mouth daily.   fluticasone (FLONASE) 50 MCG/ACT nasal spray Place 2 sprays into both nostrils daily. (Patient not taking: Reported on 02/03/2023)   Probiotic Product (PROBIOTIC PO) Take by mouth daily. (Patient not taking: Reported on 02/03/2023)   vitamin E 400 UNIT capsule Take 400 Units by mouth daily. (Patient not taking: Reported on 02/03/2023)   Facility-Administered Encounter Medications as of 02/03/2023  Medication   0.9 %  sodium chloride infusion    Allergies (verified) Amoxicillin, Levothyroxine, Statins, and Sulfa antibiotics   History: Past Medical History:  Diagnosis Date   Allergy    Arthritis    Clostridium difficile infection 2013   Hirsutism  Hyperandrogenemia    Hypertension    Osteopenia    DEXA 2016   Other alopecia    Polycystic ovaries    Tubular adenoma of colon 05/25/2015   Unspecified hypothyroidism    Past Surgical History:  Procedure Laterality Date   COLONOSCOPY  04/2015   TA, diverticulosis, rpt 5 yrs ([facility])   COLONOSCOPY  2011   adenomatous polyp, rpt 5 yrs ([place])   COLONOSCOPY  01/2022   4 TAs, mild diverticulosis, rpt 3 yrs Russella Dar)   POLYPECTOMY     TONSILLECTOMY     WISDOM TOOTH EXTRACTION     Family History  Problem  Relation Age of Onset   Heart failure Mother    Cancer Mother 75       lung   Heart disease Father    Stroke Father    Hypertension Father    Rectal cancer Paternal Grandfather    Stomach cancer Neg Hx    Colon cancer Neg Hx    Social History   Socioeconomic History   Marital status: Married    Spouse name: Merchant navy officer   Number of children: Not on file   Years of education: Not on file   Highest education level: Not on file  Occupational History   Not on file  Tobacco Use   Smoking status: Never   Smokeless tobacco: Never  Vaping Use   Vaping Use: Never used  Substance and Sexual Activity   Alcohol use: Yes    Comment: occassionally   Drug use: No   Sexual activity: Yes  Other Topics Concern   Not on file  Social History Narrative   Lives with husband [name]   Occ: retired, was middle Merchant navy officer    Activity: stays active caring for grandchildren   Diet: good water, fruits/vegetables daily    Social Determinants of Health   Financial Resource Strain: Low Risk  (02/03/2023)   Overall Financial Resource Strain (CARDIA)    Difficulty of Paying Living Expenses: Not hard at all  Food Insecurity: No Food Insecurity (02/03/2023)   Hunger Vital Sign    Worried About Running Out of Food in the Last Year: Never true    Ran Out of Food in the Last Year: Never true  Transportation Needs: No Transportation Needs (02/03/2023)   PRAPARE - Administrator, Civil Service (Medical): No    Lack of Transportation (Non-Medical): No  Physical Activity: Inactive (02/03/2023)   Exercise Vital Sign    Days of Exercise per Week: 0 days    Minutes of Exercise per Session: 0 min  Stress: No Stress Concern Present (02/03/2023)   [employer] of Occupational Health - Occupational Stress Questionnaire    Feeling of Stress : Not at all  Social Connections: Socially Integrated (02/03/2023)   Social Connection and Isolation Panel [NHANES]    Frequency of Communication with Friends and  Family: More than three times a week    Frequency of Social Gatherings with Friends and Family: More than three times a week    Attends Religious Services: More than 4 times per year    Active Member of [employer] or Organizations: Yes    Attends Engineer, structural: More than 4 times per year    Marital Status: Married    Tobacco Counseling Counseling given: Not Answered   Clinical Intake:  Pre-visit preparation completed: Yes  Pain : No/denies pain     Nutritional Risks: None Diabetes: No  How often do you need  to have someone help you when you read instructions, pamphlets, or other written materials from your doctor or pharmacy?: 1 - Never  Diabetic? no  Interpreter Needed?: No  Information entered by :: C.Henrine Hayter LPN   Activities of Daily Living    02/03/2023   11:48 AM 02/03/2023   10:06 AM  In your present state of health, do you have any difficulty performing the following activities:  Hearing? 0 0  Vision? 0 0  Difficulty concentrating or making decisions? 0 0  Walking or climbing stairs? 0 0  Dressing or bathing? 0 0  Doing errands, shopping? 0 0  Preparing Food and eating ? N N  Using the Toilet? N N  In the past six months, have you accidently leaked urine? Y Y  Comment Occasionally when coughs   Do you have problems with loss of bowel control? N N  Managing your Medications? N N  Managing your Finances? N N  Housekeeping or managing your Housekeeping? N N    Patient Care Team: Eustaquio BoydenGutierrez, Javier, MD as PCP - General (Family Medicine) Carlus PavlovGherghe, Cristina, MD as Consulting Physician (Internal Medicine) Venancio PoissonLomax, Laura, MD as Consulting Physician (Dermatology) Ellis ParentsAdkins, Allan, OD as Consulting Physician (Optometry) Willis Modenautlaw, William, MD as Consulting Physician (Gastroenterology) Olivia Mackieaavon, Richard, MD as Consulting Physician (Obstetrics and Gynecology) Milinda AntisMichelle Mottinger, DDS as Consulting Physician (Dentistry) Chriss CzarKeith Miller, MD as Consulting Physician  (Neurology)  Indicate any recent Medical Services you may have received from other than [facility] providers in the past year (date may be approximate).     Assessment:   This is a routine wellness examination for [place].  Hearing/Vision screen Hearing Screening - Comments:: No aids Vision Screening - Comments:: Glasses - Dr.Adkins  Dietary issues and exercise activities discussed: Current Exercise Habits: The patient does not participate in regular exercise at present, Exercise limited by: None identified   Goals Addressed             This Visit's Progress    Patient Stated       No new goals.       Depression Screen    02/03/2023   11:46 AM 01/30/2022   11:37 AM 11/21/2020    8:51 AM 11/17/2019   11:04 AM 11/12/2018    9:39 AM 11/07/2017    9:04 AM 11/06/2016    9:39 AM  PHQ 2/9 Scores  PHQ - 2 Score 0 0 0 0 0 0 0  PHQ- 9 Score   0 0 0 0     Fall Risk    02/03/2023   11:48 AM 02/03/2023   10:06 AM 01/30/2022   11:55 AM 11/21/2020    8:51 AM 11/17/2019   11:04 AM  Fall Risk   Falls in the past year? 0 0 0 0 0  Number falls in past yr: 0 0 0 0 0  Injury with Fall? 0 0 0 0 0  Risk for fall due to : No Fall Risks  No Fall Risks No Fall Risks Medication side effect  Follow up Falls prevention discussed;Falls evaluation completed  Falls prevention discussed Falls evaluation completed;Falls prevention discussed Falls evaluation completed;Falls prevention discussed    FALL RISK PREVENTION PERTAINING TO THE HOME:  Any stairs in or around the home? Yes  If so, are there any without handrails? No  Home free of loose throw rugs in walkways, pet beds, electrical cords, etc? Yes  Adequate lighting in your home to reduce risk of falls? Yes   ASSISTIVE DEVICES UTILIZED TO  PREVENT FALLS:  Life alert? No  Use of a cane, walker or w/c? No  Grab bars in the bathroom? No  Shower chair or bench in shower? No  Elevated toilet seat or a handicapped toilet? No    Cognitive  Function:    11/21/2020    8:53 AM 11/17/2019   11:05 AM 11/12/2018    9:40 AM 11/07/2017    9:04 AM 11/06/2016    9:49 AM  MMSE - Mini Mental State Exam  Not completed: Unable to complete      Orientation to time  5 5 5 5   Orientation to Place  5 5 5 5   Registration  3 3 3 3   Attention/ Calculation  5 0 0 0  Recall  3 3 3 3   Language- name 2 objects   0 0 0  Language- repeat  1 1 1 1   Language- follow 3 step command   3 3 3   Language- read & follow direction   0 0 0  Write a sentence   0 0 0  Copy design   0 0 0  Total score   20 20 20         02/03/2023   11:49 AM 01/30/2022   12:04 PM  6CIT Screen  What Year? 0 points 0 points  What month? 0 points 0 points  What time? 0 points 0 points  Count back from 20 0 points 0 points  Months in reverse 0 points 0 points  Repeat phrase 0 points 0 points  Total Score 0 points 0 points    Immunizations Immunization History  Administered Date(s) Administered   Fluad Quad(high Dose 65+) 09/01/2019, 08/31/2020, 08/14/2022   Influenza Split 07/11/2014   Influenza, High Dose Seasonal PF 07/24/2017, 07/24/2018, 08/26/2021   Influenza,inj,Quad PF,6+ Mos 07/28/2015, 07/05/2016   Influenza-Unspecified 07/24/2017   PFIZER Comirnaty(Gray Top)Covid-19 Tri-Sucrose Vaccine 03/15/2021   PFIZER(Purple Top)SARS-COV-2 Vaccination 12/04/2019, 12/29/2019   Pfizer Covid-19 Vaccine Bivalent Booster 85yrs & up 07/11/2021   Pneumococcal Conjugate-13 08/15/2015   Pneumococcal Polysaccharide-23 11/11/2016   Tdap 04/11/2011, 07/11/2021   Zoster Recombinat (Shingrix) 12/09/2017, 11/02/2018   Zoster, Live 01/13/2014    TDAP status: Up to date  Flu Vaccine status: Up to date  Pneumococcal vaccine status: Up to date  Covid-19 vaccine status: Information provided on how to obtain vaccines.   Qualifies for Shingles Vaccine? Yes   Zostavax completed Yes   Shingrix Completed?: Yes  Screening Tests Health Maintenance  Topic Date Due   COVID-19 Vaccine  (5 - 2023-24 season) 06/28/2022   INFLUENZA VACCINE  05/29/2023   MAMMOGRAM  01/29/2024   Medicare Annual Wellness (AWV)  02/03/2024   COLONOSCOPY (Pts 45-101yrs Insurance coverage will need to be confirmed)  02/12/2025   DEXA SCAN  05/14/2026   DTaP/Tdap/Td (3 - Td or Tdap) 07/12/2031   Pneumonia Vaccine 59+ Years old  Completed   Hepatitis C Screening  Completed   Zoster Vaccines- Shingrix  Completed   HPV VACCINES  Aged Out    Health Maintenance  Health Maintenance Due  Topic Date Due   COVID-19 Vaccine (5 - 2023-24 season) 06/28/2022    Colorectal cancer screening: Type of screening: Colonoscopy. Completed 02/12/22. Repeat every 3 years  Mammogram status: Completed 01/29/23. Repeat every year  Bone Density status: Completed 05/14/21. Results reflect: Bone density results: NORMAL. Repeat every 5 years.  Lung Cancer Screening: (Low Dose CT Chest recommended if Age 24-80 years, 30 pack-year currently smoking OR have quit w/in 15years.)  does not qualify.   Lung Cancer Screening Referral: no  Additional Screening:  Hepatitis C Screening: does qualify; Completed 10/29/91  Vision Screening: Recommended annual ophthalmology exams for early detection of glaucoma and other disorders of the eye. Is the patient up to date with their annual eye exam?  Yes  Who is the provider or what is the name of the office in which the patient attends annual eye exams? Dr.Adkins If pt is not established with a provider, would they like to be referred to a provider to establish care? No .   Dental Screening: Recommended annual dental exams for proper oral hygiene  Community Resource Referral / Chronic Care Management: CRR required this visit?  No   CCM required this visit?  No      Plan:     I have personally reviewed and noted the following in the patient's chart:   Medical and social history Use of alcohol, tobacco or illicit drugs  Current medications and supplements including opioid  prescriptions. Patient is not currently taking opioid prescriptions. Functional ability and status Nutritional status Physical activity Advanced directives List of other physicians Hospitalizations, surgeries, and ER visits in previous 12 months Vitals Screenings to include cognitive, depression, and falls Referrals and appointments  In addition, I have reviewed and discussed with patient certain preventive protocols, quality metrics, and best practice recommendations. A written personalized care plan for preventive services as well as general preventive health recommendations were provided to patient.     Maryan Puls, LPN   11/02/1094   Nurse Notes: Pt has appointment scheduled to see about having ovaries removed.

## 2023-02-03 NOTE — Patient Instructions (Signed)
Cynthia Roberson , Thank you for taking time to come for your Medicare Wellness Visit. I appreciate your ongoing commitment to your health goals. Please review the following plan we discussed and let me know if I can assist you in the future.   These are the goals we discussed:  Goals      Follow up with Primary Care Provider     Starting 11/12/2018, I will continue to take medications as prescribed and to keep appointments with PCP as scheduled.      Patient Stated     11/17/2019, I will try to start eating a much healthier diet.     Patient Stated     11/21/2020, I will maintain and continue medications as prescribed.      Patient Stated     No new goals.        This is a list of the screening recommended for you and due dates:  Health Maintenance  Topic Date Due   COVID-19 Vaccine (5 - 2023-24 season) 06/28/2022   Flu Shot  05/29/2023   Mammogram  01/29/2024   Medicare Annual Wellness Visit  02/03/2024   Colon Cancer Screening  02/12/2025   DEXA scan (bone density measurement)  05/14/2026   DTaP/Tdap/Td vaccine (3 - Td or Tdap) 07/12/2031   Pneumonia Vaccine  Completed   Hepatitis C Screening: USPSTF Recommendation to screen - Ages 918-79 yo.  Completed   Zoster (Shingles) Vaccine  Completed   HPV Vaccine  Aged Out    Advanced directives: Advance directive discussed with you today. Even though you declined this today, please call our office should you change your mind, and we can give you the proper paperwork for you to fill out.   Conditions/risks identified: Aim for 30 minutes of exercise or brisk walking, 6-8 glasses of water, and 5 servings of fruits and vegetables each day.   Next appointment: Follow up in one year for your annual wellness visit 02/04/24 @ 11:30 telephone visit.   Preventive Care 2865 Years and Older, Female Preventive care refers to lifestyle choices and visits with your health care provider that can promote health and wellness. What does preventive care  include? A yearly physical exam. This is also called an annual well check. Dental exams once or twice a year. Routine eye exams. Ask your health care provider how often you should have your eyes checked. Personal lifestyle choices, including: Daily care of your teeth and gums. Regular physical activity. Eating a healthy diet. Avoiding tobacco and drug use. Limiting alcohol use. Practicing safe sex. Taking low-dose aspirin every day. Taking vitamin and mineral supplements as recommended by your health care provider. What happens during an annual well check? The services and screenings done by your health care provider during your annual well check will depend on your age, overall health, lifestyle risk factors, and family history of disease. Counseling  Your health care provider may ask you questions about your: Alcohol use. Tobacco use. Drug use. Emotional well-being. Home and relationship well-being. Sexual activity. Eating habits. History of falls. Memory and ability to understand (cognition). Work and work Astronomerenvironment. Reproductive health. Screening  You may have the following tests or measurements: Height, weight, and BMI. Blood pressure. Lipid and cholesterol levels. These may be checked every 5 years, or more frequently if you are over 73 years old. Skin check. Lung cancer screening. You may have this screening every year starting at age 73 if you have a 30-pack-year history of smoking and currently smoke  or have quit within the past 15 years. Fecal occult blood test (FOBT) of the stool. You may have this test every year starting at age 68. Flexible sigmoidoscopy or colonoscopy. You may have a sigmoidoscopy every 5 years or a colonoscopy every 10 years starting at age 101. Hepatitis C blood test. Hepatitis B blood test. Sexually transmitted disease (STD) testing. Diabetes screening. This is done by checking your blood sugar (glucose) after you have not eaten for a while  (fasting). You may have this done every 1-3 years. Bone density scan. This is done to screen for osteoporosis. You may have this done starting at age 27. Mammogram. This may be done every 1-2 years. Talk to your health care provider about how often you should have regular mammograms. Talk with your health care provider about your test results, treatment options, and if necessary, the need for more tests. Vaccines  Your health care provider may recommend certain vaccines, such as: Influenza vaccine. This is recommended every year. Tetanus, diphtheria, and acellular pertussis (Tdap, Td) vaccine. You may need a Td booster every 10 years. Zoster vaccine. You may need this after age 37. Pneumococcal 13-valent conjugate (PCV13) vaccine. One dose is recommended after age 48. Pneumococcal polysaccharide (PPSV23) vaccine. One dose is recommended after age 63. Talk to your health care provider about which screenings and vaccines you need and how often you need them. This information is not intended to replace advice given to you by your health care provider. Make sure you discuss any questions you have with your health care provider. Document Released: 11/10/2015 Document Revised: 07/03/2016 Document Reviewed: 08/15/2015 Elsevier Interactive Patient Education  2017 [employer].  Fall Prevention in the Home Falls can cause injuries. They can happen to people of all ages. There are many things you can do to make your home safe and to help prevent falls. What can I do on the outside of my home? Regularly fix the edges of walkways and driveways and fix any cracks. Remove anything that might make you trip as you walk through a door, such as a raised step or threshold. Trim any bushes or trees on the path to your home. Use bright outdoor lighting. Clear any walking paths of anything that might make someone trip, such as rocks or tools. Regularly check to see if handrails are loose or broken. Make sure that  both sides of any steps have handrails. Any raised decks and porches should have guardrails on the edges. Have any leaves, snow, or ice cleared regularly. Use sand or salt on walking paths during winter. Clean up any spills in your garage right away. This includes oil or grease spills. What can I do in the bathroom? Use night lights. Install grab bars by the toilet and in the tub and shower. Do not use towel bars as grab bars. Use non-skid mats or decals in the tub or shower. If you need to sit down in the shower, use a plastic, non-slip stool. Keep the floor dry. Clean up any water that spills on the floor as soon as it happens. Remove soap buildup in the tub or shower regularly. Attach bath mats securely with double-sided non-slip rug tape. Do not have throw rugs and other things on the floor that can make you trip. What can I do in the bedroom? Use night lights. Make sure that you have a light by your bed that is easy to reach. Do not use any sheets or blankets that are too big for  your bed. They should not hang down onto the floor. Have a firm chair that has side arms. You can use this for support while you get dressed. Do not have throw rugs and other things on the floor that can make you trip. What can I do in the kitchen? Clean up any spills right away. Avoid walking on wet floors. Keep items that you use a lot in easy-to-reach places. If you need to reach something above you, use a strong step stool that has a grab bar. Keep electrical cords out of the way. Do not use floor polish or wax that makes floors slippery. If you must use wax, use non-skid floor wax. Do not have throw rugs and other things on the floor that can make you trip. What can I do with my stairs? Do not leave any items on the stairs. Make sure that there are handrails on both sides of the stairs and use them. Fix handrails that are broken or loose. Make sure that handrails are as long as the stairways. Check  any carpeting to make sure that it is firmly attached to the stairs. Fix any carpet that is loose or worn. Avoid having throw rugs at the top or bottom of the stairs. If you do have throw rugs, attach them to the floor with carpet tape. Make sure that you have a light switch at the top of the stairs and the bottom of the stairs. If you do not have them, ask someone to add them for you. What else can I do to help prevent falls? Wear shoes that: Do not have high heels. Have rubber bottoms. Are comfortable and fit you well. Are closed at the toe. Do not wear sandals. If you use a stepladder: Make sure that it is fully opened. Do not climb a closed stepladder. Make sure that both sides of the stepladder are locked into place. Ask someone to hold it for you, if possible. Clearly mark and make sure that you can see: Any grab bars or handrails. First and last steps. Where the edge of each step is. Use tools that help you move around (mobility aids) if they are needed. These include: Canes. Walkers. Scooters. Crutches. Turn on the lights when you go into a dark area. Replace any light bulbs as soon as they burn out. Set up your furniture so you have a clear path. Avoid moving your furniture around. If any of your floors are uneven, fix them. If there are any pets around you, be aware of where they are. Review your medicines with your doctor. Some medicines can make you feel dizzy. This can increase your chance of falling. Ask your doctor what other things that you can do to help prevent falls. This information is not intended to replace advice given to you by your health care provider. Make sure you discuss any questions you have with your health care provider. Document Released: 08/10/2009 Document Revised: 03/21/2016 Document Reviewed: 11/18/2014 Elsevier Interactive Patient Education  2017 [employer].

## 2023-02-12 DIAGNOSIS — H04123 Dry eye syndrome of bilateral lacrimal glands: Secondary | ICD-10-CM | POA: Diagnosis not present

## 2023-02-12 DIAGNOSIS — H02889 Meibomian gland dysfunction of unspecified eye, unspecified eyelid: Secondary | ICD-10-CM | POA: Diagnosis not present

## 2023-02-12 DIAGNOSIS — H524 Presbyopia: Secondary | ICD-10-CM | POA: Diagnosis not present

## 2023-02-12 DIAGNOSIS — H43823 Vitreomacular adhesion, bilateral: Secondary | ICD-10-CM | POA: Diagnosis not present

## 2023-02-12 DIAGNOSIS — H5213 Myopia, bilateral: Secondary | ICD-10-CM | POA: Diagnosis not present

## 2023-02-17 DIAGNOSIS — N951 Menopausal and female climacteric states: Secondary | ICD-10-CM | POA: Diagnosis not present

## 2023-02-17 DIAGNOSIS — Z0000 Encounter for general adult medical examination without abnormal findings: Secondary | ICD-10-CM | POA: Diagnosis not present

## 2023-02-19 ENCOUNTER — Other Ambulatory Visit (INDEPENDENT_AMBULATORY_CARE_PROVIDER_SITE_OTHER): Payer: Medicare PPO

## 2023-02-19 ENCOUNTER — Other Ambulatory Visit: Payer: Self-pay | Admitting: Family Medicine

## 2023-02-19 DIAGNOSIS — E559 Vitamin D deficiency, unspecified: Secondary | ICD-10-CM | POA: Diagnosis not present

## 2023-02-19 DIAGNOSIS — E785 Hyperlipidemia, unspecified: Secondary | ICD-10-CM

## 2023-02-19 DIAGNOSIS — I10 Essential (primary) hypertension: Secondary | ICD-10-CM

## 2023-02-19 DIAGNOSIS — R7303 Prediabetes: Secondary | ICD-10-CM | POA: Diagnosis not present

## 2023-02-19 LAB — LIPID PANEL
Cholesterol: 175 mg/dL (ref 0–200)
HDL: 50.1 mg/dL (ref >39.00)
LDL Cholesterol: 100 mg/dL — ABNORMAL HIGH (ref 0–99)
NonHDL: 124.78
Total CHOL/HDL Ratio: 3
Triglycerides: 126 mg/dL (ref 0.0–149.0)
VLDL: 25.2 mg/dL (ref 0.0–40.0)

## 2023-02-19 LAB — COMPREHENSIVE METABOLIC PANEL
ALT: 39 U/L — ABNORMAL HIGH (ref 0–35)
AST: 26 U/L (ref 0–37)
Albumin: 4.2 g/dL (ref 3.5–5.2)
Alkaline Phosphatase: 59 U/L (ref 39–117)
BUN: 14 mg/dL (ref 6–23)
CO2: 28 mEq/L (ref 19–32)
Calcium: 9.4 mg/dL (ref 8.4–10.5)
Chloride: 104 mEq/L (ref 96–112)
Creatinine, Ser: 0.97 mg/dL (ref 0.40–1.20)
GFR: 58.21 mL/min — ABNORMAL LOW (ref >60.00)
Glucose, Bld: 120 mg/dL — ABNORMAL HIGH (ref 70–99)
Potassium: 4.6 mEq/L (ref 3.5–5.1)
Sodium: 141 mEq/L (ref 135–145)
Total Bilirubin: 0.4 mg/dL (ref 0.2–1.2)
Total Protein: 6.5 g/dL (ref 6.0–8.3)

## 2023-02-19 LAB — MICROALBUMIN / CREATININE URINE RATIO
Creatinine,U: 67.2 mg/dL
Microalb Creat Ratio: 5.7 mg/g (ref 0.0–30.0)
Microalb, Ur: 3.8 mg/dL — ABNORMAL HIGH (ref 0.0–1.9)

## 2023-02-19 LAB — HEMOGLOBIN A1C: Hgb A1c MFr Bld: 6.1 % (ref 4.6–6.5)

## 2023-02-19 LAB — VITAMIN D 25 HYDROXY (VIT D DEFICIENCY, FRACTURES): VITD: 58.39 ng/mL (ref 30.00–100.00)

## 2023-02-26 ENCOUNTER — Ambulatory Visit (INDEPENDENT_AMBULATORY_CARE_PROVIDER_SITE_OTHER): Payer: Medicare PPO | Admitting: Family Medicine

## 2023-02-26 ENCOUNTER — Encounter: Payer: Self-pay | Admitting: Family Medicine

## 2023-02-26 VITALS — BP 118/68 | HR 72 | Temp 97.6°F | Ht 61.5 in | Wt 208.0 lb

## 2023-02-26 DIAGNOSIS — E281 Androgen excess: Secondary | ICD-10-CM | POA: Diagnosis not present

## 2023-02-26 DIAGNOSIS — E282 Polycystic ovarian syndrome: Secondary | ICD-10-CM

## 2023-02-26 DIAGNOSIS — M8580 Other specified disorders of bone density and structure, unspecified site: Secondary | ICD-10-CM

## 2023-02-26 DIAGNOSIS — E039 Hypothyroidism, unspecified: Secondary | ICD-10-CM

## 2023-02-26 DIAGNOSIS — Z0000 Encounter for general adult medical examination without abnormal findings: Secondary | ICD-10-CM

## 2023-02-26 DIAGNOSIS — I10 Essential (primary) hypertension: Secondary | ICD-10-CM

## 2023-02-26 DIAGNOSIS — E785 Hyperlipidemia, unspecified: Secondary | ICD-10-CM

## 2023-02-26 DIAGNOSIS — Z7189 Other specified counseling: Secondary | ICD-10-CM

## 2023-02-26 DIAGNOSIS — R7303 Prediabetes: Secondary | ICD-10-CM

## 2023-02-26 DIAGNOSIS — E559 Vitamin D deficiency, unspecified: Secondary | ICD-10-CM

## 2023-02-26 MED ORDER — NEBIVOLOL HCL 2.5 MG PO TABS: 2.5000 mg | ORAL_TABLET | Freq: Every day | ORAL | 4 refills | Status: DC

## 2023-02-26 NOTE — Assessment & Plan Note (Signed)
Preventative protocols reviewed and updated unless pt declined. Discussed healthy diet and lifestyle.  

## 2023-02-26 NOTE — Assessment & Plan Note (Signed)
Advanced directive discussion - continues working on this. Husband would be HCPOA then daughter Herbert Seta. New packet provided today.

## 2023-02-26 NOTE — Progress Notes (Signed)
Ph: [phone]       Fax: [phone]   Patient ID: Cynthia Roberson, female    DOB: 1951/01/31, 72 y.o.   MRN: [id number]  This visit was conducted in person.  BP 118/68   Pulse 72   Temp 97.6 F (36.4 C) (Temporal)   Ht 5' 1.5" (1.562 m)   Wt 208 lb (94.3 kg)   SpO2 96%   BMI 38.66 kg/m    CC: CPE Subjective:   HPI: Cynthia Roberson is a 71 y.o. female presenting on 02/26/2023 for Annual Exam (MCR prt 2 [AWV- 02/03/23]. Pt accompanied by husband, [name]. )   Saw health advisor last month for medicare wellness visit. Note reviewed.   No results found.  Flowsheet Row Clinical Support from 02/03/2023 in [facility]  PHQ-2 Total Score 0          02/03/2023   11:48 AM 02/03/2023   10:06 AM 01/30/2022   11:55 AM 11/21/2020    8:51 AM 11/17/2019   11:04 AM  Fall Risk   Falls in the past year? 0 0 0 0 0  Number falls in past yr: 0 0 0 0 0  Injury with Fall? 0 0 0 0 0  Risk for fall due to : No Fall Risks  No Fall Risks No Fall Risks Medication side effect  Follow up Falls prevention discussed;Falls evaluation completed  Falls prevention discussed Falls evaluation completed;Falls prevention discussed Falls evaluation completed;Falls prevention discussed     Sees Dr Elvera Lennox yearly for postmenopausal hyperandrogenism and hypothyroidism. GYN didn't think PCOS contributing. She stopped metformin 09/2020 due to chronic diarrhea with improvement off med. On and off spironolactone 25mg  through endo, currently off.   She finds [employer] collagen powder has helped her joint pains, and Hydro-eyes has helped dry eyes.  She is taking vitamin D 5000 IU daily with benefit, [employer] brand.   Stopped flonase, she has since started OTC astepro nasal spray and feels it's more effective.   Recent labs were not fasting.   Preventative: COLONOSCOPY 04/2015 - TA, diverticulosis, rpt 5 yrs (Outlaw). Due - will call for appointment.  COLONOSCOPY 01/2022 - 4 TAs, mild  diverticulosis, rpt 3 yrs Russella Dar) Well woman - sees Dr Billy Coast yearly, aged out of cervical cancer screening.  Mammogram 01/2023 -Birads1 through OBGYN office ([facility]).  DEXA - h/o osteopenia, latest DEXA 04/2021 WNL - this is followed by OBGYN.  Lung cancer screening - not eligible  Flu shot - yearly  COVID vaccine Pfizer 11/2019, 12/2019, booster 02/2021, bivalent 06/2021 Tdap 2012, 2022 Prevnar-13 2016, pneumovax 2018  RSV - discussed, considering zostavax -2015  Shingrix - 11/2017, 10/2018  Advanced directive discussion - continues working on this. Husband would be HCPOA then daughter Herbert Seta. New packet provided today.  Seat belt use discussed.  Sunscreen use discussed, no changing moles on skin - sees derm yearly Dr [place].  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 Interim medical history since our last visit reviewed. Allergies and medications reviewed  and updated. Outpatient Medications Prior to Visit  Medication Sig Dispense Refill   Azelastine HCl (ASTEPRO NA) Place 2 sprays into the nose daily as needed.     b complex vitamins capsule Take 1 capsule by mouth daily.     Cholecalciferol (VITAMIN D-3) 125 MCG (5000 UT) TABS Take 1 tablet by mouth daily.     COLLAGEN PO Take by mouth.     cycloSPORINE (RESTASIS) 0.05 % ophthalmic emulsion Place 1 drop into both eyes 2 (two) times daily.     Famotidine (PEPCID PO) Take by mouth as needed. OTC     lactase (LACTAID) 3000 units tablet Take by mouth 3 (three) times daily with meals. As needed      loratadine (CLARITIN) 10 MG tablet Take 10 mg by mouth daily as needed.      Menaquinone-7 (VITAMIN K2 PO) Take by mouth daily.     NONFORMULARY OR COMPOUNDED ITEM 2 capsules 2 (two) times daily. Hydro-eye     Saw Palmetto, Serenoa repens, (SAW PALMETTO PO) Take by mouth daily.     Selenium 200 MCG CAPS Take by mouth.     SYNTHROID 50 MCG tablet TAKE 1 TABLET BY MOUTH EVERY MORNING BEFORE BREAKFAST 90 tablet 3   Zinc 50 MG TABS Take 1 tablet by mouth daily.     nebivolol (BYSTOLIC) 2.5 MG tablet TAKE 1 TABLET BY MOUTH DAILY 90 tablet 0   Accu-Chek Softclix Lancets lancets Use once a day 100 each 3   azelastine (ASTELIN) 0.1 % nasal spray Place into both nostrils 2 (two) times daily. Use in each nostril as directed     Blood Glucose Monitoring Suppl (ACCU-CHEK GUIDE ME) w/Device KIT Use as advised 1 kit 1   fluticasone (FLONASE) 50 MCG/ACT nasal spray Place 2 sprays into both nostrils daily. (Patient not taking: Reported on 02/03/2023)     glucose blood (ACCU-CHEK GUIDE) test strip Use once a day, rotating check times 100 each 3   Probiotic Product (PROBIOTIC PO) Take by mouth daily.     vitamin E 400 UNIT capsule Take 400 Units by mouth daily.     0.9 %  sodium chloride infusion      No facility-administered medications prior to visit.     Per HPI unless specifically indicated in ROS section below Review of Systems  Constitutional:  Negative for activity change, appetite change, chills, fatigue, fever and unexpected weight change.  HENT:  Negative for hearing loss.   Eyes:  Negative for visual disturbance.       Chronic dry eye  Respiratory:  Negative for cough, chest tightness, shortness of breath and wheezing.   Cardiovascular:  Negative for chest pain, palpitations and leg swelling.  Gastrointestinal:  Negative for abdominal distention, abdominal pain, blood in stool, constipation, diarrhea, nausea and vomiting.  Genitourinary:  Negative for difficulty urinating and hematuria.   Musculoskeletal:  Negative for arthralgias, myalgias and neck pain.  Skin:  Negative for rash.  Neurological:  Negative for dizziness, seizures, syncope and headaches.  Hematological:  Negative for adenopathy. Does not bruise/bleed easily.  Psychiatric/Behavioral:  Negative for dysphoric mood. The patient is not nervous/anxious.     Objective:  BP 118/68   Pulse 72   Temp 97.6 F (36.4 C) (Temporal)   Ht 5' 1.5" (1.562 m)   Wt 208 lb (94.3 kg)   SpO2 96%   BMI 38.66 kg/m   Wt Readings from Last 3 Encounters:  02/26/23 208 lb (94.3 kg)  02/03/23 204  lb (92.5 kg)  09/02/22 202 lb 3.2 oz (91.7 kg)      Physical Exam Vitals and nursing note reviewed.  Constitutional:      Appearance: Normal appearance. She is not ill-appearing.  HENT:     Head: Normocephalic and atraumatic.     Right Ear: Tympanic membrane, ear canal and external ear normal. There is no impacted cerumen.     Left Ear: Tympanic membrane, ear canal and external ear normal. There is no impacted cerumen.     Nose: Nose normal.     Mouth/Throat:     Mouth: Mucous membranes are moist.     Pharynx: Oropharynx is clear. No oropharyngeal exudate or posterior oropharyngeal erythema.  Eyes:     General:        Right eye: No discharge.        Left eye: No discharge.     Extraocular Movements: Extraocular movements intact.     Conjunctiva/sclera: Conjunctivae normal.     Pupils: Pupils are equal, round, and reactive to light.  Neck:     Thyroid: No thyroid mass or thyromegaly.     Vascular: No carotid bruit.  Cardiovascular:     Rate and Rhythm: Normal rate and regular rhythm.     Pulses: Normal pulses.     Heart sounds: Normal heart sounds. No murmur heard. Pulmonary:     Effort: Pulmonary effort is normal. No respiratory distress.     Breath sounds: Normal breath sounds. No wheezing, rhonchi or rales.  Abdominal:     General: Bowel sounds are normal. There is no distension.     Palpations: Abdomen is soft.  There is no mass.     Tenderness: There is no abdominal tenderness. There is no guarding or rebound.     Hernia: No hernia is present.  Musculoskeletal:     Cervical back: Normal range of motion and neck supple. No rigidity.     Right lower leg: No edema.     Left lower leg: No edema.  Lymphadenopathy:     Cervical: No cervical adenopathy.  Skin:    General: Skin is warm and dry.     Findings: No rash.  Neurological:     General: No focal deficit present.     Mental Status: She is alert. Mental status is at baseline.  Psychiatric:        Mood and Affect: Mood normal.        Behavior: Behavior normal.       Results for orders placed or performed in visit on 02/19/23  Microalbumin / creatinine urine ratio  Result Value Ref Range   Microalb, Ur 3.8 (H) 0.0 - 1.9 mg/dL   Creatinine,U 16.1 mg/dL   Microalb Creat Ratio 5.7 0.0 - 30.0 mg/g  VITAMIN D 25 Hydroxy (Vit-D Deficiency, Fractures)  Result Value Ref Range   VITD 58.39 30.00 - 100.00 ng/mL  Hemoglobin A1c  Result Value Ref Range   Hgb A1c MFr Bld 6.1 4.6 - 6.5 %  Comprehensive metabolic panel  Result Value Ref Range   Sodium 141 135 - 145 mEq/L   Potassium 4.6 3.5 - 5.1 mEq/L   Chloride 104 96 - 112 mEq/L   CO2 28 19 - 32 mEq/L   Glucose, Bld 120 (H) 70 - 99 mg/dL   BUN 14 6 - 23 mg/dL   Creatinine, Ser 0.96 0.40 - 1.20 mg/dL   Total Bilirubin 0.4 0.2 - 1.2 mg/dL   Alkaline Phosphatase 59 39 -  117 U/L   AST 26 0 - 37 U/L   ALT 39 (H) 0 - 35 U/L   Total Protein 6.5 6.0 - 8.3 g/dL   Albumin 4.2 3.5 - 5.2 g/dL   GFR 82.95 (L) >62.13 mL/min   Calcium 9.4 8.4 - 10.5 mg/dL  Lipid panel  Result Value Ref Range   Cholesterol 175 0 - 200 mg/dL   Triglycerides 086.5 0.0 - 149.0 mg/dL   HDL 78.46 >96.29 mg/dL   VLDL 52.8 0.0 - 41.3 mg/dL   LDL Cholesterol 244 (H) 0 - 99 mg/dL   Total CHOL/HDL Ratio 3    NonHDL 124.78    Lab Results  Component Value Date   TSH 2.98 09/02/2022   Assessment & Plan:   Problem List  Items Addressed This Visit     PCOS (polycystic ovarian syndrome)   Hypothyroidism    Followed by endo       Relevant Medications   nebivolol (BYSTOLIC) 2.5 MG tablet   Hyperandrogenemia   Essential hypertension, benign    Chronic, stable on low dose nebivolol - continue.       Relevant Medications   nebivolol (BYSTOLIC) 2.5 MG tablet   Osteopenia    Continue vit D 5000 IU daily.       Prediabetes    Continue limiting added sugar/sweetened beverages in diet      Routine general medical examination at a health care facility - Primary (Chronic)    Preventative protocols reviewed and updated unless pt declined. Discussed healthy diet and lifestyle.       Dyslipidemia    Chronic, off medication. Cholesterol levels have improved over the past year.  The 10-year ASCVD risk score (Arnett DK, et al., 2019) is: 13.1%   Values used to calculate the score:     Age: 21 years     Sex: Female     Is Non-Hispanic African American: No     Diabetic: No     Tobacco smoker: No     Systolic Blood Pressure: 118 mmHg     Is BP treated: Yes     HDL Cholesterol: 50.1 mg/dL     Total Cholesterol: 175 mg/dL       Advanced care planning/counseling discussion (Chronic)    Advanced directive discussion - continues working on this. Husband would be HCPOA then daughter Herbert Seta. New packet provided today.       Severe obesity (BMI 35.0-39.9) with comorbidity (HCC)    Continue to encourage healthy diet and lifestyle choices. Obesity complicated by comorbidities of hypertension, prediabetes, PCOS, hyperandrogenism, and dyslipidemia      Vitamin D deficiency    Continue vit D 5000 IU daily.         Meds ordered this encounter  Medications   nebivolol (BYSTOLIC) 2.5 MG tablet    Sig: Take 1 tablet (2.5 mg total) by mouth daily.    Dispense:  90 tablet    Refill:  4    No orders of the defined types were placed in this encounter.   Patient Instructions  If interested, check with  pharmacy about new RSV shot  Advanced directive packet provided today  You are doing well today  Return in 1 year for next physical/wellness visit.  Follow up plan: Return in about 1 year (around 02/26/2024) for medicare wellness visit, annual exam, prior fasting for blood work.  Eustaquio Boyden, MD

## 2023-02-26 NOTE — Patient Instructions (Addendum)
If interested, check with pharmacy about new RSV shot  Advanced directive packet provided today  You are doing well today  Return in 1 year for next physical/wellness visit.

## 2023-02-27 ENCOUNTER — Encounter: Payer: Self-pay | Admitting: Family Medicine

## 2023-02-27 NOTE — Assessment & Plan Note (Signed)
Continue to encourage healthy diet and lifestyle choices. Obesity complicated by comorbidities of hypertension, prediabetes, PCOS, hyperandrogenism, and dyslipidemia

## 2023-02-27 NOTE — Assessment & Plan Note (Signed)
Chronic, stable on low dose nebivolol - continue.

## 2023-02-27 NOTE — Assessment & Plan Note (Signed)
Followed by endo.  

## 2023-02-27 NOTE — Assessment & Plan Note (Signed)
Chronic, off medication. Cholesterol levels have improved over the past year.  The 10-year ASCVD risk score (Arnett DK, et al., 2019) is: 13.1%   Values used to calculate the score:     Age: 72 years     Sex: Female     Is Non-Hispanic African American: No     Diabetic: No     Tobacco smoker: No     Systolic Blood Pressure: 118 mmHg     Is BP treated: Yes     HDL Cholesterol: 50.1 mg/dL     Total Cholesterol: 175 mg/dL

## 2023-02-27 NOTE — Assessment & Plan Note (Signed)
Continue vit D 5000 IU daily.  

## 2023-02-27 NOTE — Assessment & Plan Note (Signed)
Continue limiting added sugar/sweetened beverages in diet

## 2023-03-20 DIAGNOSIS — Z01419 Encounter for gynecological examination (general) (routine) without abnormal findings: Secondary | ICD-10-CM | POA: Diagnosis not present

## 2023-03-20 DIAGNOSIS — Z01411 Encounter for gynecological examination (general) (routine) with abnormal findings: Secondary | ICD-10-CM | POA: Diagnosis not present

## 2023-03-20 DIAGNOSIS — Z124 Encounter for screening for malignant neoplasm of cervix: Secondary | ICD-10-CM | POA: Diagnosis not present

## 2023-05-05 DIAGNOSIS — L570 Actinic keratosis: Secondary | ICD-10-CM | POA: Diagnosis not present

## 2023-05-05 DIAGNOSIS — L821 Other seborrheic keratosis: Secondary | ICD-10-CM | POA: Diagnosis not present

## 2023-05-05 DIAGNOSIS — D225 Melanocytic nevi of trunk: Secondary | ICD-10-CM | POA: Diagnosis not present

## 2023-05-14 ENCOUNTER — Telehealth: Payer: Medicare PPO | Admitting: Nurse Practitioner

## 2023-05-14 DIAGNOSIS — U071 COVID-19: Secondary | ICD-10-CM

## 2023-05-14 NOTE — Progress Notes (Signed)
Since your COVID-19 symptoms started less than 5 days ago you may need a prescription for FDA-approved treatments (pills or IV therapy), which we cannot prescribe through an e-Visit. The best way for our providers to make a decision about which COVID treatment is right for you is through a Virtual Urgent Care Visit.  If you would like to discuss COVID therapy options with a provider, cancel this e-Visit and access a Virtual Urgent Care Visit from our Mychart menu.   You will not be charged for the e-Visit.    E-Visit  for Positive Covid Test Result   We are sorry you are not feeling well. We are here to help!  You have tested positive for COVID-19, meaning that you were infected with the novel coronavirus and could give the virus to others.  Most people with COVID-19 have mild illness and can recover at home without medical care. Do not leave your home, except to get medical care. Do not visit public areas and do not go to places where you are unable to wear a mask. It is important that you stay home  to take care for yourself and to help protect other people in your home and community.      Isolation Instructions:   You are to isolate at home until you have been fever free for at least 24 hours without a fever-reducing medication, and symptoms have been steadily improving for 24 hours. At that time,  you can end isolation but need to mask for an additional 5 days.  If you must be around other household members who do not have symptoms, you need to make sure that both you and the family members are masking consistently with a high-quality mask.  If you note any worsening of symptoms despite treatment, please seek an in-person evaluation ASAP. If you note any significant shortness of breath or any chest pain, please seek ER evaluation. Please do not delay care!   Go to the nearest hospital ED for assessment if fever/cough/breathlessness are severe or illness seems like a threat to life.    The  following symptoms may appear 2-14 days after exposure: Fever Cough Shortness of breath or difficulty breathing Chills Repeated shaking with chills Muscle pain Headache Sore throat New loss of taste or smell Fatigue Congestion or runny nose Nausea or vomiting Diarrhea    You may also take acetaminophen (Tylenol) as needed for fever.  HOME CARE: Only take medications as instructed by your medical team. Drink plenty of fluids and get plenty of rest. A steam or ultrasonic humidifier can help if you have congestion.   GET HELP RIGHT AWAY IF YOU HAVE EMERGENCY WARNING SIGNS.  Call 911 or proceed to your closest emergency facility if: You develop worsening high fever. Trouble breathing Bluish lips or face Persistent pain or pressure in the chest New confusion Inability to wake or stay awake You cough up blood. Your symptoms become more severe Inability to hold down food or fluids  This list is not all possible symptoms. Contact your medical provider for any symptoms that are severe or concerning to you.   Your e-visit answers were reviewed by a board certified advanced clinical practitioner to complete your personal care plan.  Depending on the condition, your plan could have included both over the counter or prescription medications.  If there is a problem please reply once you have received a response from your provider.  Your safety is important to [place].  If you have drug  allergies check your prescription carefully.    You can use MyChart to ask questions about today's visit, request a non-urgent call back, or ask for a work or school excuse for 24 hours related to this e-Visit. If it has been greater than 24 hours you will need to follow up with your provider, or enter a new e-Visit to address those concerns. You will get an e-mail in the next two days asking about your experience.  I hope that your e-visit has been valuable and will speed your recovery. Thank you for using  e-visits.

## 2023-05-15 ENCOUNTER — Telehealth: Payer: Medicare PPO | Admitting: Internal Medicine

## 2023-05-15 ENCOUNTER — Encounter: Payer: Self-pay | Admitting: Internal Medicine

## 2023-05-15 VITALS — Temp 98.8°F

## 2023-05-15 DIAGNOSIS — U071 COVID-19: Secondary | ICD-10-CM

## 2023-05-15 HISTORY — DX: COVID-19: U07.1

## 2023-05-15 MED ORDER — NIRMATRELVIR/RITONAVIR (PAXLOVID) TABLET (RENAL DOSING): 2.0000 | ORAL_TABLET | Freq: Two times a day (BID) | ORAL | 0 refills | Status: AC

## 2023-05-15 NOTE — Assessment & Plan Note (Addendum)
No severe symptoms but is at increased risk--mostly age Analgesics, cough med Discussed paxlovid--she would like to take this---will send renal dose To ER if worsens or sig SOB Discussed isolation and masking

## 2023-05-15 NOTE — Progress Notes (Signed)
Subjective:    Patient ID: Cynthia Roberson, female    DOB: 1951-03-11, 72 y.o.   MRN: [id number]  HPI Video virtual visit due to COVID infection Identification done Reviewed limitations and billing and she gave consent Participants--patient in her home and I am in my office  Started with sore throat--mild 3 days ago--then went away Thought she was starting a sinus infection 2 days ago---sore throat worse and the had sudden worsening----joint pain, headache. Felt like the flu Did call in and suggested testing for COVID yesterday--was positive  Feels some better today Did have fever up to 103.7! Did take extra strength tylenol--brought temp down to 99.5 Bad chills 2 days ago--shivering.   No SOB Headache is variable---worse with bad cough Mostly just wants to sleep---but only can sleep for an hour at a time  Has left over benzonatate --not as effective Is using tussin  Current Outpatient Medications on File Prior to Visit  Medication Sig Dispense Refill   Azelastine HCl (ASTEPRO NA) Place 2 sprays into the nose daily as needed.     b complex vitamins capsule Take 1 capsule by mouth daily.     Cholecalciferol (VITAMIN D-3) 125 MCG (5000 UT) TABS Take 1 tablet by mouth daily.     COLLAGEN PO Take by mouth.     cycloSPORINE (RESTASIS) 0.05 % ophthalmic emulsion Place 1 drop into both eyes 2 (two) times daily.     Famotidine (PEPCID PO) Take by mouth as needed. OTC     lactase (LACTAID) 3000 units tablet Take by mouth 3 (three) times daily with meals. As needed     loratadine (CLARITIN) 10 MG tablet Take 10 mg by mouth daily as needed.      Menaquinone-7 (VITAMIN K2 PO) Take by mouth daily.     nebivolol (BYSTOLIC) 2.5 MG tablet Take 1 tablet (2.5 mg total) by mouth daily. 90 tablet 4   NONFORMULARY OR COMPOUNDED ITEM 2 capsules 2 (two) times daily. Hydro-eye     Saw Palmetto, Serenoa repens, (SAW PALMETTO PO) Take by mouth daily.     Selenium 200 MCG CAPS Take by mouth.      SYNTHROID 50 MCG tablet TAKE 1 TABLET BY MOUTH EVERY MORNING BEFORE BREAKFAST 90 tablet 3   Zinc 50 MG TABS Take 1 tablet by mouth daily.     No current facility-administered medications on file prior to visit.    Allergies  Allergen Reactions   Amoxicillin Rash   Levothyroxine Palpitations    With generic levothyroxine, tolerates well the brand name.   Statins Palpitations    Tried Crestor and others   Sulfa Antibiotics Rash    Past Medical History:  Diagnosis Date   Allergy    Arthritis    Clostridium difficile infection 2013   Hirsutism    Hyperandrogenemia    Hypertension    Osteopenia    DEXA 2016   Other alopecia    Polycystic ovaries    Tubular adenoma of colon 05/25/2015   Unspecified hypothyroidism     Past Surgical History:  Procedure Laterality Date   COLONOSCOPY  04/2015   TA, diverticulosis, rpt 5 yrs ([facility])   COLONOSCOPY  2011   adenomatous polyp, rpt 5 yrs ([facility])   COLONOSCOPY  01/2022   4 TAs, mild diverticulosis, rpt 3 yrs Russella Dar)   POLYPECTOMY     TONSILLECTOMY     WISDOM TOOTH EXTRACTION      Family History  Problem Relation Age of Onset  Heart failure Mother    Cancer Mother 6       lung   Heart disease Father    Stroke Father    Hypertension Father    Rectal cancer Paternal Grandfather    Stomach cancer Neg Hx    Colon cancer Neg Hx     Social History   Socioeconomic History   Marital status: Married    Spouse name: [name]   Number of children: Not on file   Years of education: Not on file   Highest education level: Not on file  Occupational History   Not on file  Tobacco Use   Smoking status: Never   Smokeless tobacco: Never  Vaping Use   Vaping status: Never Used  Substance and Sexual Activity   Alcohol use: Yes    Comment: occassionally   Drug use: No   Sexual activity: Yes  Other Topics Concern   Not on file  Social History Narrative   Lives with husband [name]   Occ: retired, was middle Merchant navy officer     Activity: stays active caring for grandchildren   Diet: good water, fruits/vegetables daily    Social Determinants of Health   Financial Resource Strain: Low Risk  (02/03/2023)   Overall Financial Resource Strain (CARDIA)    Difficulty of Paying Living Expenses: Not hard at all  Food Insecurity: No Food Insecurity (02/03/2023)   Hunger Vital Sign    Worried About Running Out of Food in the Last Year: Never true    Ran Out of Food in the Last Year: Never true  Transportation Needs: No Transportation Needs (02/03/2023)   PRAPARE - Administrator, Civil Service (Medical): No    Lack of Transportation (Non-Medical): No  Physical Activity: Inactive (02/03/2023)   Exercise Vital Sign    Days of Exercise per Week: 0 days    Minutes of Exercise per Session: 0 min  Stress: No Stress Concern Present (02/03/2023)   [employer] of Occupational Health - Occupational Stress Questionnaire    Feeling of Stress : Not at all  Social Connections: Socially Integrated (02/03/2023)   Social Connection and Isolation Panel [NHANES]    Frequency of Communication with Friends and Family: More than three times a week    Frequency of Social Gatherings with Friends and Family: More than three times a week    Attends Religious Services: More than 4 times per year    Active Member of [employer] or Organizations: Yes    Attends Engineer, structural: More than 4 times per year    Marital Status: Married  Catering manager Violence: Not At Risk (02/03/2023)   Humiliation, Afraid, Rape, and Kick questionnaire    Fear of Current or Ex-Partner: No    Emotionally Abused: No    Physically Abused: No    Sexually Abused: No   Review of Systems No recent COVID vaccination No loss or smell or taste No N/V Appetite is off Keeping isolated from husband Retired     Objective:   Physical Exam Constitutional:      Appearance: Normal appearance.  Pulmonary:     Effort: Pulmonary effort is normal. No  respiratory distress.  Neurological:     Mental Status: She is alert.            Assessment & Plan:

## 2023-08-29 ENCOUNTER — Telehealth: Payer: Medicare PPO | Admitting: Physician Assistant

## 2023-08-29 DIAGNOSIS — B9689 Other specified bacterial agents as the cause of diseases classified elsewhere: Secondary | ICD-10-CM | POA: Diagnosis not present

## 2023-08-29 DIAGNOSIS — J208 Acute bronchitis due to other specified organisms: Secondary | ICD-10-CM

## 2023-08-29 MED ORDER — DOXYCYCLINE HYCLATE 100 MG PO TABS
100.0000 mg | ORAL_TABLET | Freq: Two times a day (BID) | ORAL | 0 refills | Status: DC
Start: 2023-08-29 — End: 2024-01-09

## 2023-08-29 MED ORDER — PREDNISONE 20 MG PO TABS
40.0000 mg | ORAL_TABLET | Freq: Every day | ORAL | 0 refills | Status: DC
Start: 2023-08-29 — End: 2024-01-09

## 2023-08-29 MED ORDER — BENZONATATE 100 MG PO CAPS
100.0000 mg | ORAL_CAPSULE | Freq: Three times a day (TID) | ORAL | 0 refills | Status: DC | PRN
Start: 2023-08-29 — End: 2024-01-09

## 2023-08-29 NOTE — Progress Notes (Signed)

## 2023-09-03 ENCOUNTER — Ambulatory Visit: Payer: Medicare PPO | Admitting: Internal Medicine

## 2023-09-23 ENCOUNTER — Encounter: Payer: Self-pay | Admitting: Internal Medicine

## 2023-09-23 ENCOUNTER — Ambulatory Visit: Payer: Medicare PPO | Admitting: Internal Medicine

## 2023-09-23 ENCOUNTER — Other Ambulatory Visit: Payer: Self-pay

## 2023-09-23 VITALS — BP 146/80 | HR 79 | Ht 61.5 in | Wt 207.6 lb

## 2023-09-23 DIAGNOSIS — R7303 Prediabetes: Secondary | ICD-10-CM

## 2023-09-23 DIAGNOSIS — E039 Hypothyroidism, unspecified: Secondary | ICD-10-CM | POA: Diagnosis not present

## 2023-09-23 DIAGNOSIS — E281 Androgen excess: Secondary | ICD-10-CM

## 2023-09-23 NOTE — Progress Notes (Signed)
Patient ID: Cynthia Roberson, female   DOB: [birth year], 72 y.o.   MRN: [id number]  HPI  Cynthia Roberson is a 72 y.o.-year-old female, returning for post menopausal hyperandrogenism, hypothyroidism, prediabetes. Last visit 1 year ago.  Interim history: She has mild SOB, ankle pain -cannot walk much.  At last appointment with PCP, he suggested the weight management clinic.  However, she wants to try dieting by herself. Before last visit, she started to use rice water spray on forehead >> feels she has new hair.  Off saw palmetto and spearmint tea - now off. She had COVID again in 04/2023 (3rd). She got Paxlovid. She now has bronchitis - had cough. She took Doxycycline,  Prednisone - po taper x 5 days - finished 3 weeks ago.  Reviewed history: Patient had a long history of hyperandrogenism (high testosterone level), and has a history of PCOS + infertility. She could not tolerate OCPs in the past (migraines).   The patient has a history of scalp hair thinning since her younger years, for which she has been using prescription strength, Rogaine with good results. She continues on minoxidil for scalp hair loss.  Also, saw palmetto, and rice water spray.  No acne.  She is postmenopausal since ~ age 33. She had a always had a history of irregular menses and had been on birth control in the past. She had difficulty with conception and had used Clomid. She has had 2 successful pregnancies in the past. Following this, the patient had regular menses for some time until she attained menopause.  No signs of masculinization.  Testosterone level - elevated at 130 in 2013.  Subsequent levels were still high, however, all of the levels below were drawn while on spironolactone. We stopped checking her testosterone levels after 03/2015, when she was on spironolactone Component     Latest Ref Rng 07/06/2014 07/14/2014 10/11/2014 04/26/2015  Testosterone     3 - 41 ng/dL 332 (H)  97 (H)   Sex Hormone Binding     18 -  114 nmol/L 31     Testosterone Free     0.0 - 4.2 pg/mL 33.8 (H)  6.1 (H)   Testosterone-% Free     0.4 - 2.4 % 2.0     FSH      26.1     LH      20.3     DHEA-SO4     29.4 - 220.5 ug/dL  95.1    Testosterone, total     7.0 - 40.0 ng/dL    884.1 (H)   Reviewed previous work-up: - Ruled out for Cushing's syndrome based on 1 mg dex suppression test- morning cortisol is 1.47 (05/2013) - DHEAS 44.3, Prolactin 5.9 (05/2013) - CT Abdomen and pelvis with contrast (04/2013) - No adrenal mass or any ovarian abnormality - Ovarian US was negative (06/2014) At last visit with Dr.Phadke, the use of flutamide was discussed, as well as the possibility of oophorectomy.  She is also seeing Dr Billy Coast [facility]) and Dr Nicholas Lose (derm).  She has been on spironolactone since 05/2013 with mild elevation in her potassium levels so the dose of spironolactone was maintained low, 25 mg daily. In 2018 we decided to stop spironolactone. She felt better afterwards. She noticed significantly less hair on chin especially after she started waxing every 3 weeks.   At our visit in 08/2021, she felt that her hirsutism worsened. We restarted spironolactone - 25 mg twice a day.  She stopped recently 2/2 feeling  poorly, especially after she increased the dose to twice a day.  She continues with spearmint tea.  Testosterone levels reviewed: Component     Latest Ref Rng 09/02/2022  Testosterone, Serum (Total)     ng/dL 161 (H)   % Free Testosterone     % 1.3   Free Testosterone, S     pg/mL 16 (H)   Sex Hormone Binding Globulin     nmol/L 39.4    Component     Latest Ref Rng & Units 09/07/2021  Testosterone, Serum (Total)     ng/dL 096 (H)  % Free Testosterone     % 0.8  Free Testosterone, S     pg/mL 14 (H)  Sex Hormone Binding Globulin     nmol/L 55.8   Component     Latest Ref Rng & Units 09/04/2020  Testosterone, Serum (Total)     ng/dL 045 (H)  % Free Testosterone     % 0.9  Free Testosterone, S      pg/mL 9.8 (H)  Sex Hormone Binding Globulin     nmol/L 43.2  Hemoglobin A1C     4.6 - 6.5 % 6.1  TSH     0.35 - 4.50 uIU/mL 2.30  T4,Free(Direct)     0.60 - 1.60 ng/dL 4.09  DHEA-Sulfate, LCMS     Ug/dL Reference Range:  Adult Females (61 - 70y): <128  68   Prev.: Component     Latest Ref Rng & Units 07/24/2018 09/01/2019  Testosterone, Serum (Total)     ng/dL 92 (H) 811 (H)  % Free Testosterone     % 1.2 1.2  Free Testosterone, S     pg/mL 11 (H) 15 (H)  Sex Hormone Binding Globulin     nmol/L 50.3 46.5   On spironolactone: Component     Latest Ref Rng & Units 07/24/2017  Testosterone     3 - 41 ng/dL 82 (H)  Testosterone Free     0.0 - 4.2 pg/mL 2.8  Sex Horm Binding Glob, Serum     17.3 - 125.0 nmol/L 50.8   Reviewed potassium levels: Lab Results  Component Value Date   K 4.6 02/19/2023   K 4.9 01/31/2022   K 4.6 10/08/2021   K 4.0 09/07/2021   K 4.7 12/06/2020   Hypothyroidism, well controlled - dx in 1995 -She continues on Synthroid d.a.w. 50 mcg daily, stable dose for the last 17 years -We tried to switch to generic levothyroxine but she could not tolerate it due to palpitations.  Pt takes Synthroid: - in am - fasting - at least 30 min from b'fast - no calcium - no iron - no multivitamins - no PPIs - + on Biotin - stopped 1 week (B Complex)  Latest TSH was normal: Lab Results  Component Value Date   TSH 2.98 09/02/2022   Prediabetes.  - She did not tolerate  regular metformin in the past >> Metformin ER 1000 mg daily with brunch >> had to stop at the end of 2022 due to diarrhea.  Currently off diabetic medications.  HbA1c levels are in the prediabetic range: Lab Results  Component Value Date   HGBA1C 6.1 02/19/2023   HGBA1C 6.3 09/02/2022   HGBA1C 6.4 09/07/2021   HGBA1C 5.8 (A) 12/11/2020   HGBA1C 6.1 09/04/2020   HGBA1C 5.9 11/17/2019   HGBA1C 5.5 09/01/2019   HGBA1C 6.0 07/24/2018   HGBA1C 5.9 11/07/2017   HGBA1C 5.8 07/24/2017    HGBA1C  5.9 02/21/2017   HGBA1C 5.9 07/25/2016   HGBA1C 6.1 01/23/2016   HGBA1C 6.0 02/09/2015   HGBA1C 6.3 10/11/2014   HGBA1C 6.2 07/06/2014   HGBA1C 5.7 01/11/2014   HGBA1C 5.6 06/10/2013   She started ASA 81 per Dr. Bertis Ruddy >> bruising, petechiae >> stopped.  Has numbness and tingling in L hand. She has carpal tunnel.  ROS: + see HPI  I reviewed pt's medications, allergies, PMH, social hx, family hx, and changes were documented in the history of present illness. Otherwise, unchanged from my initial visit note.  Past Medical History:  Diagnosis Date   Allergy    Arthritis    Clostridium difficile infection 2013   Hirsutism    Hyperandrogenemia    Hypertension    Osteopenia    DEXA 2016   Other alopecia    Polycystic ovaries    Tubular adenoma of colon 05/25/2015   Unspecified hypothyroidism    Past Surgical History:  Procedure Laterality Date   COLONOSCOPY  04/2015   TA, diverticulosis, rpt 5 yrs ([facility])   COLONOSCOPY  2011   adenomatous polyp, rpt 5 yrs ([facility])   COLONOSCOPY  01/2022   4 TAs, mild diverticulosis, rpt 3 yrs [facility]ella [facility])   POLYPECTOMY     TONSILLECTOMY     WISDOM TOOTH EXTRACTION     Social History   Social History   Marital Status: Married    Spouse Name: N/A   Number of Children: 2   Occupational History   Retired Runner, broadcasting/film/video, now Development worker, community   Social History Main Topics   Smoking status: Never Smoker    Smokeless tobacco: Not on file   Alcohol Use:     1-3 Glasses of wine per year   Drug Use: No   Sexual Activity: Yes   Current Outpatient Medications on File Prior to Visit  Medication Sig Dispense Refill   Azelastine HCl (ASTEPRO NA) Place 2 sprays into the nose daily as needed.     b complex vitamins capsule Take 1 capsule by mouth daily.     benzonatate (TESSALON) 100 MG capsule Take 1-2 capsules (100-200 mg total) by mouth 3 (three) times daily as needed. 30 capsule 0   Cholecalciferol (VITAMIN D-3) 125 MCG (5000 UT)  TABS Take 1 tablet by mouth daily.     COLLAGEN PO Take by mouth.     cycloSPORINE (RESTASIS) 0.05 % ophthalmic emulsion Place 1 drop into both eyes 2 (two) times daily.     doxycycline (VIBRA-TABS) 100 MG tablet Take 1 tablet (100 mg total) by mouth 2 (two) times daily. 20 tablet 0   Famotidine (PEPCID PO) Take by mouth as needed. OTC     lactase (LACTAID) 3000 units tablet Take by mouth 3 (three) times daily with meals. As needed     loratadine (CLARITIN) 10 MG tablet Take 10 mg by mouth daily as needed.      Menaquinone-7 (VITAMIN K2 PO) Take by mouth daily.     nebivolol (BYSTOLIC) 2.5 MG tablet Take 1 tablet (2.5 mg total) by mouth daily. 90 tablet 4   NONFORMULARY OR COMPOUNDED ITEM 2 capsules 2 (two) times daily. Hydro-eye     predniSONE (DELTASONE) 20 MG tablet Take 2 tablets (40 mg total) by mouth daily with breakfast. 10 tablet 0   Saw Palmetto, Serenoa repens, (SAW PALMETTO PO) Take by mouth daily.     Selenium 200 MCG CAPS Take by mouth.     SYNTHROID 50 MCG tablet TAKE 1 TABLET BY  MOUTH EVERY MORNING BEFORE BREAKFAST 90 tablet 3   Zinc 50 MG TABS Take 1 tablet by mouth daily.     No current facility-administered medications on file prior to visit.   Allergies  Allergen Reactions   Amoxicillin Rash   Levothyroxine Palpitations    With generic levothyroxine, tolerates well the brand name.   Statins Palpitations    Tried Crestor and others   Sulfa Antibiotics Rash   Family History  Problem Relation Age of Onset   Heart failure Mother    Cancer Mother 41       lung   Heart disease Father    Stroke Father    Hypertension Father    Rectal cancer Paternal Grandfather    Stomach cancer Neg Hx    Colon cancer Neg Hx    PE: BP (!) 146/80   Pulse 79   Ht 5' 1.5" (1.562 m)   Wt 207 lb 9.6 oz (94.2 kg)   SpO2 97%   BMI 38.59 kg/m  Wt Readings from Last 3 Encounters:  09/23/23 207 lb 9.6 oz (94.2 kg)  02/26/23 208 lb (94.3 kg)  02/03/23 204 lb (92.5 kg)    Constitutional: overweight, in NAD Eyes: EOMI, no exophthalmos ENT: no thyromegaly, no cervical lymphadenopathy Cardiovascular: RRR, No MRG Respiratory: CTA B Musculoskeletal: no deformities, strength intact in all 4 Skin: + female pattern baldness, hirsutism on chin Neurological: no tremor with outstretched hands  ASSESSMENT: 1. Postmenopausal hyperandrogenism - She discussed with Dr. Welford Roche and Dr. Billy Coast before about the possibility of performing oophorectomy to look for ovarian hyperthecosis or small hilar ovarian tumors. She refused. We are following her clinically and biochemically.  2. Hypothyroidism  3. Prediabetes  PLAN:  1. Postmenopausal hyperandrogenism -likely due to pre-existing PCOS Patient with history of hyperandrogenism due to most likely previously undiagnosed PCOS or possibly secondary to insulin resistance or less likely).  Adrenal tumors, ovarian tumors, or ovarian hyperthecosis are less likely for her since these occurred with usually higher levels of testosterone, higher than 150 ng/mL.  Patient had repeated CT imaging of her adrenals, and also transvaginal ultrasound for evaluation of her ovaries and these tests were all negative for any masses or hyperplasia.  Her hormonal testing was also negative for hyperprolactinemia, pituitary dysfunction, Cushing syndrome. -We initially started her on spironolactone but then stopped to see if she absolutely needed it.  She felt better after stopping it.  A testosterone level was high after stopping spironolactone but is was slightly better on recheck.  She started spearmint tea in an effort to decrease her testosterone levels.  She was then interested in restarting spironolactone.  We checked her testosterone again and this was slightly higher.  Potassium was normal.  We restarted spironolactone, 25 mg twice a day.  However, she stopped it before last visit due to the fact that she was feeling poorly especially with a higher  dose.  At today's visit, she is interested in restarting the lower dose, 25 mg daily.  I agree with this especially since her blood pressure is elevated. 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  Also, spearmint tea, saw palmetto (although she was off these lately due to her illness), rice water spray.  She waxes her chin. -We did discuss in the past about proceeding with bilateral oophorectomy and she discussed with Dr. Billy Coast about it.  It was not felt that oophorectomy would help her. -Reviewed testosterone level from last visit: Still elevated but stable.  Will recheck this today. -I will see her back in 1 year  2. Hypothyroidism - latest thyroid labs reviewed with pt. >> normal: Lab Results  Component Value Date   TSH 2.98 09/02/2022  - she continues on LT4 (Synthroid d.a.w.) 50 mcg daily - pt feels good on this dose. - we discussed about taking the thyroid hormone every day, with water, >30 minutes before breakfast, separated by >4 hours from acid reflux medications, calcium, iron, multivitamins. Pt. is taking it correctly. - will check thyroid tests today: TSH  - If labs are abnormal, she will need to return for repeat TFTs in 1.5 months  3. Prediabetes -She was previously on metformin ER 1000 mg daily but stopped after having diarrhea with it. -At last visit, HbA1c was 6.3%, decreased from 6.4% despite being off diabetic medications.  She had another HbA1c 7 months ago which was improved to 6.1%. -I encouraged her to continue exercising (she has a [employer] at home) and she was planning to improve diet. -Will recheck this today  Needs refills - Synthroid and Spironolactone.  Carlus Pavlov, MD PhD [facility]

## 2023-09-23 NOTE — Patient Instructions (Addendum)
Please stop at the lab.  You can start Spironolactone 25 mg daily.  Please continue Synthroid 50 mcg daily.  Take the thyroid hormone every day, with water, at least 30 minutes before breakfast, separated by at least 4 hours from: - acid reflux medications - calcium - iron - multivitamins   Please come back for a follow-up appointment in 1 year.

## 2023-09-26 ENCOUNTER — Other Ambulatory Visit: Payer: Self-pay | Admitting: Internal Medicine

## 2023-09-27 LAB — HEMOGLOBIN A1C
Hgb A1c MFr Bld: 6.3 %{Hb} — ABNORMAL HIGH (ref <5.7)
Mean Plasma Glucose: 134 mg/dL
eAG (mmol/L): 7.4 mmol/L

## 2023-09-27 LAB — TESTOSTERONE, FREE: TESTOSTERONE FREE: 20.2 pg/mL — ABNORMAL HIGH (ref 0.3–5.0)

## 2023-09-29 ENCOUNTER — Encounter: Payer: Self-pay | Admitting: Internal Medicine

## 2023-09-29 MED ORDER — SYNTHROID 50 MCG PO TABS: ORAL_TABLET | ORAL | 3 refills | Status: DC

## 2023-09-29 NOTE — Addendum Note (Signed)
Addended by: Carlus Pavlov on: 09/29/2023 10:49 AM   Modules accepted: Orders

## 2023-10-06 ENCOUNTER — Other Ambulatory Visit: Payer: Medicare PPO

## 2023-10-06 DIAGNOSIS — E039 Hypothyroidism, unspecified: Secondary | ICD-10-CM | POA: Diagnosis not present

## 2023-10-07 LAB — TSH: TSH: 3.29 m[IU]/L (ref 0.40–4.50)

## 2024-01-07 NOTE — Telephone Encounter (Signed)
 Called patient to review provider recommendations on results. Have scheduled office visit for follow up to review options with patient.  and There will be no charge to patient for this visit. Note of this made in appointment desk.

## 2024-01-09 ENCOUNTER — Ambulatory Visit: Admitting: Family Medicine

## 2024-01-09 VITALS — BP 122/80 | HR 74 | Temp 98.0°F | Ht 61.5 in | Wt 211.2 lb

## 2024-01-09 DIAGNOSIS — I10 Essential (primary) hypertension: Secondary | ICD-10-CM

## 2024-01-09 LAB — MICROALBUMIN / CREATININE URINE RATIO
Creatinine,U: 26.8 mg/dL
Microalb Creat Ratio: UNDETERMINED mg/g (ref 0.0–30.0)
Microalb, Ur: <0.7 mg/dL

## 2024-01-09 NOTE — Progress Notes (Signed)
 Ph: [phone] Fax: [phone]   Patient ID: Cynthia Roberson, female    DOB: September 07, 1951, 73 y.o.   MRN: [id number]  This visit was conducted in person.  BP 122/80   Pulse 74   Temp 98 F (36.7 C) (Oral)   Ht 5' 1.5" (1.562 m)   Wt 211 lb 4 oz (95.8 kg)   SpO2 96%   BMI 39.27 kg/m    CC: discuss lab result error Subjective:   HPI: Cynthia Roberson is a 73 y.o. female presenting on 01/09/2024 for Medical Management of Chronic Issues (Here for uACR f/u. )   Discussed recently discovered [facility] laboratory miscalculation - the UACR calculation in the software of the system was incorrect but the absolute levels of microalbumin and creatinine were correct.  Lab Results  Component Value Date   MICROALBUR 3.8 (H) 02/19/2023   MICROALBUR <0.7 01/31/2022  Microalb/Creat ratio 57 (02/19/2023) Microalb/Creat ratio 17 (02/01/2023)     Relevant past medical, surgical, family and social history reviewed and updated as indicated. Interim medical history since our last visit reviewed. Allergies and medications reviewed and updated. Outpatient Medications Prior to Visit  Medication Sig Dispense Refill   Azelastine HCl (ASTEPRO NA) Place 2 sprays into the nose daily as needed.     b complex vitamins capsule Take 1 capsule by mouth daily.     Cholecalciferol (VITAMIN D-3) 125 MCG (5000 UT) TABS Take 1 tablet by mouth daily.     COLLAGEN PO Take by mouth.     cycloSPORINE (RESTASIS) 0.05 % ophthalmic emulsion Place 1 drop into both eyes 2 (two) times daily.     Famotidine (PEPCID PO) Take by mouth as needed. OTC     lactase (LACTAID) 3000 units tablet Take by mouth 3 (three) times daily with meals. As needed     loratadine (CLARITIN) 10 MG tablet Take 10 mg by mouth daily as needed.      Menaquinone-7 (VITAMIN K2 PO) Take by mouth daily.     nebivolol (BYSTOLIC) 2.5 MG tablet Take 1 tablet (2.5 mg total) by mouth daily. 90 tablet 4   NONFORMULARY OR COMPOUNDED ITEM 2 capsules 2  (two) times daily. Hydro-eye     Saw Palmetto, Serenoa repens, (SAW PALMETTO PO) Take by mouth daily.     Selenium 200 MCG CAPS Take by mouth.     SYNTHROID 50 MCG tablet TAKE 1 TABLET BY MOUTH EVERY MORNING BEFORE BREAKFAST 90 tablet 3   Zinc 50 MG TABS Take 1 tablet by mouth daily.     benzonatate (TESSALON) 100 MG capsule Take 1-2 capsules (100-200 mg total) by mouth 3 (three) times daily as needed. 30 capsule 0   doxycycline (VIBRA-TABS) 100 MG tablet Take 1 tablet (100 mg total) by mouth 2 (two) times daily. 20 tablet 0   predniSONE (DELTASONE) 20 MG tablet Take 2 tablets (40 mg total) by mouth daily with breakfast. 10 tablet 0   No facility-administered medications prior to visit.     Per HPI unless specifically indicated in ROS section below Review of Systems  Objective:  BP 122/80   Pulse 74   Temp 98 F (36.7 C) (Oral)   Ht 5' 1.5" (1.562 m)   Wt 211 lb 4 oz (95.8 kg)   SpO2 96%   BMI 39.27 kg/m   Wt Readings from Last 3 Encounters:  01/09/24 211 lb 4 oz (95.8 kg)  09/23/23 207 lb 9.6 oz (94.2 kg)  02/26/23 208 lb (94.3 kg)  Physical Exam Vitals and nursing note reviewed.  Constitutional:      Appearance: Normal appearance. She is not ill-appearing.  Cardiovascular:     Rate and Rhythm: Normal rate and regular rhythm.     Pulses: Normal pulses.     Heart sounds: Normal heart sounds. No murmur heard. Pulmonary:     Effort: Pulmonary effort is normal. No respiratory distress.     Breath sounds: Normal breath sounds. No wheezing or rales.  Musculoskeletal:     Right lower leg: No edema.     Left lower leg: No edema.  Skin:    General: Skin is warm and dry.     Findings: No rash.  Neurological:     Mental Status: She is alert.  Psychiatric:        Mood and Affect: Mood normal.        Behavior: Behavior normal.       Results for orders placed or performed in visit on 09/23/23  Hemoglobin A1c   Collection Time: 09/23/23  2:18 PM  Result Value Ref  Range   Hgb A1c MFr Bld 6.3 (H) <5.7 % of total Hgb   Mean Plasma Glucose 134 mg/dL   eAG (mmol/L) 7.4 mmol/L  Testosterone, free   Collection Time: 09/23/23  2:18 PM  Result Value Ref Range   TESTOSTERONE FREE 20.2 (H) 0.3 - 5.0 pg/mL  TSH   Collection Time: 10/06/23 10:53 AM  Result Value Ref Range   TSH 3.29 0.40 - 4.50 mIU/L    Assessment & Plan:   Problem List Items Addressed This Visit     Essential hypertension, benign - Primary   Update Umicroalb - no charge Discussed possible AEI/ARB pending results.  Lisinopril handout provided today       Relevant Orders   Microalbumin / creatinine urine ratio     No orders of the defined types were placed in this encounter.   Orders Placed This Encounter  Procedures   Microalbumin / creatinine urine ratio    No charge    Patient Instructions  Urine test repeated today (no-charge) Pending results we may consider changing blood pressure medicine (from nebivolol to lisinopril - see handout provided).  Keep physical appointment for May as well as prior labwork.   Follow up plan: No follow-ups on file.  Eustaquio Boyden, MD

## 2024-01-09 NOTE — Assessment & Plan Note (Signed)
 Update Umicroalb - no charge Discussed possible AEI/ARB pending results.  Lisinopril handout provided today

## 2024-01-09 NOTE — Patient Instructions (Addendum)
 Urine test repeated today (no-charge) Pending results we may consider changing blood pressure medicine (from nebivolol to lisinopril - see handout provided).  Keep physical appointment for May as well as prior labwork.

## 2024-01-12 ENCOUNTER — Encounter: Payer: Self-pay | Admitting: Family Medicine

## 2024-02-03 ENCOUNTER — Encounter: Payer: Self-pay | Admitting: Obstetrics and Gynecology

## 2024-02-03 DIAGNOSIS — Z1231 Encounter for screening mammogram for malignant neoplasm of breast: Secondary | ICD-10-CM | POA: Diagnosis not present

## 2024-02-03 LAB — HM MAMMOGRAPHY

## 2024-02-04 ENCOUNTER — Ambulatory Visit (INDEPENDENT_AMBULATORY_CARE_PROVIDER_SITE_OTHER): Payer: Medicare PPO

## 2024-02-04 VITALS — Ht 61.5 in | Wt 211.0 lb

## 2024-02-04 DIAGNOSIS — Z0000 Encounter for general adult medical examination without abnormal findings: Secondary | ICD-10-CM | POA: Diagnosis not present

## 2024-02-04 NOTE — Progress Notes (Signed)
 Subjective:   Cynthia Roberson is a 73 y.o. who presents for a Medicare Wellness preventive visit.  Visit Complete: Virtual I connected with  Cynthia Roberson on 02/04/24 by a audio enabled telemedicine application and verified that I am speaking with the correct person using two identifiers.  Patient Location: Home  Provider Location: Home Office  I discussed the limitations of evaluation and management by telemedicine. The patient expressed understanding and agreed to proceed.  Vital Signs: Because this visit was a virtual/telehealth visit, some criteria may be missing or patient reported. Any vitals not documented were not able to be obtained and vitals that have been documented are patient reported.  VideoDeclined- This patient declined Librarian, academic. Therefore the visit was completed with audio only.  Persons Participating in Visit: Patient.  AWV Questionnaire: Yes: Patient Medicare AWV questionnaire was completed by the patient on 02/03/24; I have confirmed that all information answered by patient is correct and no changes since this date.  Cardiac Risk Factors include: advanced age (>7men, >41 women);dyslipidemia;hypertension;obesity (BMI >30kg/m2);sedentary lifestyle     Objective:    Today's Vitals   02/04/24 1119  Weight: 211 lb (95.7 kg)  Height: 5' 1.5" (1.562 m)   Body mass index is 39.22 kg/m.     02/04/2024   11:48 AM 02/03/2023   11:47 AM 01/30/2022   12:40 PM 01/30/2022   12:19 PM 11/21/2020    8:57 AM 11/17/2019   11:03 AM 11/12/2018    3:12 PM  Advanced Directives  Does Patient Have a Medical Advance Directive? No No No No No No No  Would patient like information on creating a medical advance directive?  No - Patient declined No - Patient declined No - Patient declined No - Patient declined Yes (MAU/Ambulatory/Procedural Areas - Information given) No - Patient declined    Current Medications (verified) Outpatient Encounter  Medications as of 02/04/2024  Medication Sig   Azelastine HCl (ASTEPRO NA) Place 2 sprays into the nose daily as needed.   b complex vitamins capsule Take 1 capsule by mouth daily.   Cholecalciferol (VITAMIN D-3) 125 MCG (5000 UT) TABS Take 1 tablet by mouth daily.   COLLAGEN PO Take by mouth.   cycloSPORINE (RESTASIS) 0.05 % ophthalmic emulsion Place 1 drop into both eyes 2 (two) times daily.   Famotidine (PEPCID PO) Take by mouth as needed. OTC   lactase (LACTAID) 3000 units tablet Take by mouth 3 (three) times daily with meals. As needed   loratadine (CLARITIN) 10 MG tablet Take 10 mg by mouth daily as needed.    Menaquinone-7 (VITAMIN K2 PO) Take by mouth daily.   nebivolol (BYSTOLIC) 2.5 MG tablet Take 1 tablet (2.5 mg total) by mouth daily.   NONFORMULARY OR COMPOUNDED ITEM 2 capsules 2 (two) times daily. Hydro-eye   Saw Palmetto, Serenoa repens, (SAW PALMETTO PO) Take by mouth daily.   Selenium 200 MCG CAPS Take by mouth.   SYNTHROID 50 MCG tablet TAKE 1 TABLET BY MOUTH EVERY MORNING BEFORE BREAKFAST   Zinc 50 MG TABS Take 1 tablet by mouth daily.   No facility-administered encounter medications on file as of 02/04/2024.    Allergies (verified) Spironolactone, Amoxicillin, Levothyroxine, Statins, and Sulfa antibiotics   History: Past Medical History:  Diagnosis Date   Allergy    Arthritis    Clostridium difficile infection 2013   COVID-19 virus infection 05/15/2023   Hirsutism    Hyperandrogenemia    Hypertension    Osteopenia  DEXA 2016   Other alopecia    Polycystic ovaries    Tubular adenoma of colon 05/25/2015   Unspecified hypothyroidism    Past Surgical History:  Procedure Laterality Date   COLONOSCOPY  04/2015   TA, diverticulosis, rpt 5 yrs ([facility])   COLONOSCOPY  2011   adenomatous polyp, rpt 5 yrs ([facility])   COLONOSCOPY  01/2022   4 TAs, mild diverticulosis, rpt 3 yrs Russella Dar)   POLYPECTOMY     TONSILLECTOMY     WISDOM TOOTH EXTRACTION     Family  History  Problem Relation Age of Onset   Heart failure Mother    Cancer Mother 3       lung   Heart disease Father    Stroke Father    Hypertension Father    Rectal cancer Paternal Grandfather    Stomach cancer Neg Hx    Colon cancer Neg Hx    Social History   Socioeconomic History   Marital status: Married    Spouse name: Merchant navy officer   Number of children: Not on file   Years of education: Not on file   Highest education level: Master's degree (e.g., MA, MS, MEng, MEd, MSW, MBA)  Occupational History   Not on file  Tobacco Use   Smoking status: Never   Smokeless tobacco: Never  Vaping Use   Vaping status: Never Used  Substance and Sexual Activity   Alcohol use: Yes    Comment: occassionally   Drug use: No   Sexual activity: Yes  Other Topics Concern   Not on file  Social History Narrative   Lives with husband [name]   Occ: retired, was middle Merchant navy officer    Activity: stays active caring for grandchildren   Diet: good water, fruits/vegetables daily    Social Drivers of Corporate investment banker Strain: Low Risk  (02/04/2024)   Overall Financial Resource Strain (CARDIA)    Difficulty of Paying Living Expenses: Not hard at all  Food Insecurity: No Food Insecurity (02/04/2024)   Hunger Vital Sign    Worried About Running Out of Food in the Last Year: Never true    Ran Out of Food in the Last Year: Never true  Transportation Needs: No Transportation Needs (02/04/2024)   PRAPARE - Administrator, Civil Service (Medical): No    Lack of Transportation (Non-Medical): No  Physical Activity: Inactive (02/04/2024)   Exercise Vital Sign    Days of Exercise per Week: 0 days    Minutes of Exercise per Session: 0 min  Stress: No Stress Concern Present (02/04/2024)   [employer] of Occupational Health - Occupational Stress Questionnaire    Feeling of Stress : Not at all  Social Connections: Socially Integrated (02/04/2024)   Social Connection and Isolation Panel  [NHANES]    Frequency of Communication with Friends and Family: More than three times a week    Frequency of Social Gatherings with Friends and Family: More than three times a week    Attends Religious Services: More than 4 times per year    Active Member of [employer] or Organizations: Yes    Attends Engineer, structural: More than 4 times per year    Marital Status: Married    Tobacco Counseling Counseling given: Not Answered  Clinical Intake:  Pre-visit preparation completed: Yes  Pain : No/denies pain   BMI - recorded: 39.22 Nutritional Status: BMI > 30  Obese Nutritional Risks: None Diabetes: No  Lab Results  Component Value Date   HGBA1C 6.3 (H) 09/23/2023   HGBA1C 6.1 02/19/2023   HGBA1C 6.3 09/02/2022     How often do you need to have someone help you when you read instructions, pamphlets, or other written materials from your doctor or pharmacy?: 1 - Never  Interpreter Needed?: No  Comments: lives with husband Information entered by :: B.Jaymin Waln,LPN   Activities of Daily Living     02/03/2024    6:53 PM  In your present state of health, do you have any difficulty performing the following activities:  Hearing? 0  Vision? 0  Difficulty concentrating or making decisions? 0  Walking or climbing stairs? 0  Dressing or bathing? 0  Doing errands, shopping? 0  Preparing Food and eating ? N  Using the Toilet? N  In the past six months, have you accidently leaked urine? Y  Do you have problems with loss of bowel control? N  Managing your Medications? N  Managing your Finances? N  Housekeeping or managing your Housekeeping? N    Patient Care Team: Eustaquio Boyden, MD as PCP - General (Family Medicine) Carlus Pavlov, MD as Consulting Physician (Internal Medicine) Venancio Poisson, MD as Consulting Physician (Dermatology) Ellis Parents, OD as Consulting Physician (Optometry) Willis Modena, MD as Consulting Physician (Gastroenterology) Olivia Mackie,  MD as Consulting Physician (Obstetrics and Gynecology) Milinda Antis, DDS as Consulting Physician (Dentistry) Chriss Czar, MD as Consulting Physician (Neurology)  Indicate any recent Medical Services you may have received from other than [facility] providers in the past year (date may be approximate).     Assessment:   This is a routine wellness examination for [place].  Hearing/Vision screen Hearing Screening - Comments:: Pt says her hearing is good Vision Screening - Comments:: Pt says her vision is good;glasses for driving; dry eye syndrome [facility]   Goals Addressed             This Visit's Progress    Follow up with Primary Care Provider   On track    Starting 11/12/2018, I will continue to take medications as prescribed and to keep appointments with PCP as scheduled.      Patient Stated   On track    02/04/24- I will maintain and continue medications as prescribed.        Depression Screen     02/04/2024   11:31 AM 01/09/2024   10:25 AM 02/03/2023   11:46 AM 01/30/2022   11:37 AM 11/21/2020    8:51 AM 11/17/2019   11:04 AM 11/12/2018    9:39 AM  PHQ 2/9 Scores  PHQ - 2 Score 0 0 0 0 0 0 0  PHQ- 9 Score  3   0 0 0    Fall Risk     02/03/2024    6:53 PM 01/09/2024   10:25 AM 02/03/2023   11:48 AM 02/03/2023   10:06 AM 01/30/2022   11:55 AM  Fall Risk   Falls in the past year? 0 0 0 0 0  Number falls in past yr: 0  0 0 0  Injury with Fall? 0  0 0 0  Risk for fall due to : No Fall Risks  No Fall Risks  No Fall Risks  Follow up Education provided;Falls prevention discussed  Falls prevention discussed;Falls evaluation completed  Falls prevention discussed    MEDICARE RISK AT HOME:  Medicare Risk at Home Any stairs in or around the home?: (Patient-Rptd) Yes If so, are there any  without handrails?: (Patient-Rptd) No Home free of loose throw rugs in walkways, pet beds, electrical cords, etc?: (Patient-Rptd) Yes Adequate lighting in your home to  reduce risk of falls?: (Patient-Rptd) Yes Life alert?: (Patient-Rptd) No Use of a cane, walker or w/c?: (Patient-Rptd) No Grab bars in the bathroom?: (Patient-Rptd) No Shower chair or bench in shower?: (Patient-Rptd) No Elevated toilet seat or a handicapped toilet?: (Patient-Rptd) No  TIMED UP AND GO:  Was the test performed?  No  Cognitive Function: 6CIT completed    11/21/2020    8:53 AM 11/17/2019   11:05 AM 11/12/2018    9:40 AM 11/07/2017    9:04 AM 11/06/2016    9:49 AM  MMSE - Mini Mental State Exam  Not completed: Unable to complete      Orientation to time  5 5 5 5   Orientation to Place  5 5 5 5   Registration  3 3 3 3   Attention/ Calculation  5 0 0 0  Recall  3 3 3 3   Language- name 2 objects   0 0 0  Language- repeat  1 1 1 1   Language- follow 3 step command   3 3 3   Language- read & follow direction   0 0 0  Write a sentence   0 0 0  Copy design   0 0 0  Total score   20 20 20         02/04/2024   11:38 AM 02/03/2023   11:49 AM 01/30/2022   12:04 PM  6CIT Screen  What Year? 0 points 0 points 0 points  What month? 0 points 0 points 0 points  What time? 0 points 0 points 0 points  Count back from 20 0 points 0 points 0 points  Months in reverse 0 points 0 points 0 points  Repeat phrase 0 points 2 points 0 points  Total Score 0 points 2 points 0 points    Immunizations Immunization History  Administered Date(s) Administered   Fluad Quad(high Dose 65+) 09/01/2019, 08/31/2020, 08/14/2022   Influenza Split 07/11/2014   Influenza, High Dose Seasonal PF 07/24/2017, 07/24/2018, 08/26/2021   Influenza,inj,Quad PF,6+ Mos 07/28/2015, 07/05/2016   Influenza-Unspecified 07/24/2017, 08/04/2023   PFIZER Comirnaty(Gray Top)Covid-19 Tri-Sucrose Vaccine 03/15/2021   PFIZER(Purple Top)SARS-COV-2 Vaccination 12/04/2019, 12/29/2019   Pfizer Covid-19 Vaccine Bivalent Booster 65yrs & up 07/11/2021   Pneumococcal Conjugate-13 08/15/2015   Pneumococcal Polysaccharide-23 11/11/2016    Respiratory Syncytial Virus Vaccine,Recomb Aduvanted(Arexvy) 08/04/2023   Tdap 04/11/2011, 07/11/2021   Zoster Recombinant(Shingrix) 12/09/2017, 11/02/2018   Zoster, Live 01/13/2014    Screening Tests Health Maintenance  Topic Date Due   COVID-19 Vaccine (5 - 2024-25 season) 06/29/2023   INFLUENZA VACCINE  05/28/2024   MAMMOGRAM  02/02/2025   Medicare Annual Wellness (AWV)  02/03/2025   Colonoscopy  02/12/2025   DEXA SCAN  05/14/2026   DTaP/Tdap/Td (3 - Td or Tdap) 07/12/2031   Pneumonia Vaccine 61+ Years old  Completed   Hepatitis C Screening  Completed   Zoster Vaccines- Shingrix  Completed   HPV VACCINES  Aged Out    Health Maintenance  Health Maintenance Due  Topic Date Due   COVID-19 Vaccine (5 - 2024-25 season) 06/29/2023   Health Maintenance Items Addressed: None needed  Additional Screening:  Vision Screening: Recommended annual ophthalmology exams for early detection of glaucoma and other disorders of the eye.  Dental Screening: Recommended annual dental exams for proper oral hygiene  Community Resource Referral / Chronic Care Management: CRR required this  visit?  No   CCM required this visit?  No    Plan:     I have personally reviewed and noted the following in the patient's chart:   Medical and social history Use of alcohol, tobacco or illicit drugs  Current medications and supplements including opioid prescriptions. Patient is not currently taking opioid prescriptions. Functional ability and status Nutritional status Physical activity Advanced directives List of other physicians Hospitalizations, surgeries, and ER visits in previous 12 months Vitals Screenings to include cognitive, depression, and falls Referrals and appointments  In addition, I have reviewed and discussed with patient certain preventive protocols, quality metrics, and best practice recommendations. A written personalized care plan for preventive services as well as general  preventive health recommendations were provided to patient.    Sue Lush, LPN   6/0/7371   After Visit Summary: (MyChart) Due to this being a telephonic visit, the after visit summary with patients personalized plan was offered to patient via MyChart   Notes: Pt has CPE with PCP 02/27/24. She wants to talk about whether she can eat "some": grapefruit and some left arm weakness (happens especially at night).  She would like Vit D check (serum not urine)

## 2024-02-04 NOTE — Patient Instructions (Signed)
 Ms. Treat , Thank you for taking time to come for your Medicare Wellness Visit. I appreciate your ongoing commitment to your health goals. Please review the following plan we discussed and let me know if I can assist you in the future.   Referrals/Orders/Follow-Ups/Clinician Recommendations: none  This is a list of the screening recommended for you and due dates:  Health Maintenance  Topic Date Due   COVID-19 Vaccine (5 - 2024-25 season) 06/29/2023   Flu Shot  05/28/2024   Mammogram  02/02/2025   Medicare Annual Wellness Visit  02/03/2025   Colon Cancer Screening  02/12/2025   DEXA scan (bone density measurement)  05/14/2026   DTaP/Tdap/Td vaccine (3 - Td or Tdap) 07/12/2031   Pneumonia Vaccine  Completed   Hepatitis C Screening  Completed   Zoster (Shingles) Vaccine  Completed   HPV Vaccine  Aged Out    Advanced directives: (Declined) Advance directive discussed with you today. Even though you declined this today, please call our office should you change your mind, and we can give you the proper paperwork for you to fill out.  Next Medicare Annual Wellness Visit scheduled for next year: Yes 02/07/25 @11 :30am televisit

## 2024-02-15 ENCOUNTER — Other Ambulatory Visit: Payer: Self-pay | Admitting: Family Medicine

## 2024-02-15 DIAGNOSIS — D751 Secondary polycythemia: Secondary | ICD-10-CM

## 2024-02-15 DIAGNOSIS — E785 Hyperlipidemia, unspecified: Secondary | ICD-10-CM

## 2024-02-15 DIAGNOSIS — E559 Vitamin D deficiency, unspecified: Secondary | ICD-10-CM

## 2024-02-15 DIAGNOSIS — E039 Hypothyroidism, unspecified: Secondary | ICD-10-CM

## 2024-02-15 DIAGNOSIS — R7303 Prediabetes: Secondary | ICD-10-CM

## 2024-02-18 DIAGNOSIS — H5213 Myopia, bilateral: Secondary | ICD-10-CM | POA: Diagnosis not present

## 2024-02-18 DIAGNOSIS — H43823 Vitreomacular adhesion, bilateral: Secondary | ICD-10-CM | POA: Diagnosis not present

## 2024-02-18 DIAGNOSIS — H04123 Dry eye syndrome of bilateral lacrimal glands: Secondary | ICD-10-CM | POA: Diagnosis not present

## 2024-02-18 DIAGNOSIS — H524 Presbyopia: Secondary | ICD-10-CM | POA: Diagnosis not present

## 2024-02-18 DIAGNOSIS — H02889 Meibomian gland dysfunction of unspecified eye, unspecified eyelid: Secondary | ICD-10-CM | POA: Diagnosis not present

## 2024-02-20 ENCOUNTER — Other Ambulatory Visit: Payer: Medicare PPO

## 2024-02-20 DIAGNOSIS — E039 Hypothyroidism, unspecified: Secondary | ICD-10-CM

## 2024-02-20 DIAGNOSIS — D751 Secondary polycythemia: Secondary | ICD-10-CM

## 2024-02-20 DIAGNOSIS — R7303 Prediabetes: Secondary | ICD-10-CM | POA: Diagnosis not present

## 2024-02-20 DIAGNOSIS — E559 Vitamin D deficiency, unspecified: Secondary | ICD-10-CM | POA: Diagnosis not present

## 2024-02-20 DIAGNOSIS — E785 Hyperlipidemia, unspecified: Secondary | ICD-10-CM | POA: Diagnosis not present

## 2024-02-20 LAB — COMPREHENSIVE METABOLIC PANEL WITH GFR
ALT: 29 U/L (ref 0–35)
AST: 21 U/L (ref 0–37)
Albumin: 4.3 g/dL (ref 3.5–5.2)
Alkaline Phosphatase: 67 U/L (ref 39–117)
BUN: 15 mg/dL (ref 6–23)
CO2: 30 meq/L (ref 19–32)
Calcium: 9.4 mg/dL (ref 8.4–10.5)
Chloride: 102 meq/L (ref 96–112)
Creatinine, Ser: 1 mg/dL (ref 0.40–1.20)
GFR: 55.72 mL/min — ABNORMAL LOW (ref >60.00)
Glucose, Bld: 110 mg/dL — ABNORMAL HIGH (ref 70–99)
Potassium: 4.7 meq/L (ref 3.5–5.1)
Sodium: 139 meq/L (ref 135–145)
Total Bilirubin: 0.5 mg/dL (ref 0.2–1.2)
Total Protein: 6.6 g/dL (ref 6.0–8.3)

## 2024-02-20 LAB — VITAMIN D 25 HYDROXY (VIT D DEFICIENCY, FRACTURES): VITD: 48.2 ng/mL (ref 30.00–100.00)

## 2024-02-20 LAB — LIPID PANEL
Cholesterol: 186 mg/dL (ref 0–200)
HDL: 50.9 mg/dL (ref >39.00)
LDL Cholesterol: 110 mg/dL — ABNORMAL HIGH (ref 0–99)
NonHDL: 135.54
Total CHOL/HDL Ratio: 4
Triglycerides: 127 mg/dL (ref 0.0–149.0)
VLDL: 25.4 mg/dL (ref 0.0–40.0)

## 2024-02-20 LAB — CBC WITH DIFFERENTIAL/PLATELET
Basophils Absolute: 0 10*3/uL (ref 0.0–0.1)
Basophils Relative: 0.4 % (ref 0.0–3.0)
Eosinophils Absolute: 0.3 10*3/uL (ref 0.0–0.7)
Eosinophils Relative: 5.7 % — ABNORMAL HIGH (ref 0.0–5.0)
HCT: 48.5 % — ABNORMAL HIGH (ref 36.0–46.0)
Hemoglobin: 15.8 g/dL — ABNORMAL HIGH (ref 12.0–15.0)
Lymphocytes Relative: 32.1 % (ref 12.0–46.0)
Lymphs Abs: 1.9 10*3/uL (ref 0.7–4.0)
MCHC: 32.6 g/dL (ref 30.0–36.0)
MCV: 80.5 fl (ref 78.0–100.0)
Monocytes Absolute: 0.5 10*3/uL (ref 0.1–1.0)
Monocytes Relative: 9.2 % (ref 3.0–12.0)
Neutro Abs: 3.1 10*3/uL (ref 1.4–7.7)
Neutrophils Relative %: 52.6 % (ref 43.0–77.0)
Platelets: 220 10*3/uL (ref 150.0–400.0)
RBC: 6.02 Mil/uL — ABNORMAL HIGH (ref 3.87–5.11)
RDW: 14.4 % (ref 11.5–15.5)
WBC: 5.9 10*3/uL (ref 4.0–10.5)

## 2024-02-20 LAB — HEMOGLOBIN A1C: Hgb A1c MFr Bld: 6.3 % (ref 4.6–6.5)

## 2024-02-20 LAB — TSH: TSH: 2.44 u[IU]/mL (ref 0.35–5.50)

## 2024-02-27 ENCOUNTER — Ambulatory Visit (INDEPENDENT_AMBULATORY_CARE_PROVIDER_SITE_OTHER): Payer: Medicare PPO | Admitting: Family Medicine

## 2024-02-27 ENCOUNTER — Encounter: Payer: Self-pay | Admitting: Family Medicine

## 2024-02-27 VITALS — BP 124/82 | HR 75 | Temp 98.6°F | Ht 61.5 in | Wt 211.4 lb

## 2024-02-27 DIAGNOSIS — Z0000 Encounter for general adult medical examination without abnormal findings: Secondary | ICD-10-CM | POA: Diagnosis not present

## 2024-02-27 DIAGNOSIS — Z7189 Other specified counseling: Secondary | ICD-10-CM

## 2024-02-27 DIAGNOSIS — N1831 Chronic kidney disease, stage 3a: Secondary | ICD-10-CM

## 2024-02-27 DIAGNOSIS — M8580 Other specified disorders of bone density and structure, unspecified site: Secondary | ICD-10-CM

## 2024-02-27 DIAGNOSIS — D751 Secondary polycythemia: Secondary | ICD-10-CM | POA: Diagnosis not present

## 2024-02-27 DIAGNOSIS — E039 Hypothyroidism, unspecified: Secondary | ICD-10-CM | POA: Diagnosis not present

## 2024-02-27 DIAGNOSIS — G5602 Carpal tunnel syndrome, left upper limb: Secondary | ICD-10-CM

## 2024-02-27 DIAGNOSIS — E282 Polycystic ovarian syndrome: Secondary | ICD-10-CM

## 2024-02-27 DIAGNOSIS — E785 Hyperlipidemia, unspecified: Secondary | ICD-10-CM | POA: Diagnosis not present

## 2024-02-27 DIAGNOSIS — E281 Androgen excess: Secondary | ICD-10-CM | POA: Diagnosis not present

## 2024-02-27 DIAGNOSIS — I10 Essential (primary) hypertension: Secondary | ICD-10-CM | POA: Diagnosis not present

## 2024-02-27 DIAGNOSIS — E559 Vitamin D deficiency, unspecified: Secondary | ICD-10-CM

## 2024-02-27 DIAGNOSIS — R7303 Prediabetes: Secondary | ICD-10-CM

## 2024-02-27 MED ORDER — NEBIVOLOL HCL 2.5 MG PO TABS: 2.5000 mg | ORAL_TABLET | Freq: Every day | ORAL | 4 refills | Status: AC

## 2024-02-27 MED ORDER — MAGNESIUM 250 MG PO CAPS: 1.0000 | ORAL_CAPSULE | Freq: Every day | ORAL | Status: DC

## 2024-02-27 NOTE — Assessment & Plan Note (Signed)
Chronic, stable on current regimen.  

## 2024-02-27 NOTE — Progress Notes (Unsigned)
 Ph: [phone] Fax: [phone]   Patient ID: Cynthia Roberson, female    DOB: 1951/10/15, 73 y.o.   MRN: [id number]  This visit was conducted in person.  BP 124/82   Pulse 75   Temp 98.6 F (37 C) (Oral)   Ht 5' 1.5" (1.562 m)   Wt 211 lb 6 oz (95.9 kg)   SpO2 96%   BMI 39.29 kg/m    CC: CPE Subjective:   HPI: Cynthia Roberson is a 73 y.o. female presenting on 02/27/2024 for Annual Exam (MCR prt 2 [AWV- 02/04/24]. Pt accompanied husband, Cynthia Roberson.  )   Saw health advisor 01/2024 for medicare wellness visit. Note reviewed.   No results found.  Flowsheet Row Clinical Support from 02/04/2024 in [facility] [place]  PHQ-2 Total Score 0          02/03/2024    6:53 PM 01/09/2024   10:25 AM 02/03/2023   11:48 AM 02/03/2023   10:06 AM 01/30/2022   11:55 AM  Fall Risk   Falls in the past year? 0 0 0 0 0  Number falls in past yr: 0  0 0 0  Injury with Fall? 0  0 0 0  Risk for fall due to : No Fall Risks  No Fall Risks  No Fall Risks  Follow up Education provided;Falls prevention discussed  Falls prevention discussed;Falls evaluation completed  Falls prevention discussed   Sees Dr Aldona Amel yearly for postmenopausal hyperandrogenism and hypothyroidism. GYN didn't think PCOS contributing. She stopped metformin  09/2020 due to chronic diarrhea with improvement off med. On and off spironolactone  25mg  through endo, currently off.   She finds [employer] collagen powder has helped her joint pains, and Hydro-eyes has helped dry eyes.  She continues taking vitamin D  5000 IU daily with benefit, Garden of Life brand.   She started myo-anositol for PCOS.   Stopped flonase, she has since started OTC astepro nasal spray and feels it's more effective.   Longstanding h/o L arm/hand discomfort for 20-30 yrs. Paresthesias and numbness to first 3 digits, worse in sleep. She is not using wrist brace to help her sleep. No significant pain. Notes weakness to left arm. Hand shakes when  holding a cup of coffee. Recently not as bad. No significant neck pain.   She had a bad experience with blood draw - asks about other options for lab draws outside of our office.   Preventative: COLONOSCOPY 01/2022 - 4 TAs, mild diverticulosis, rpt 3 yrs Sandrea Cruel) Well woman - sees Dr Harless Lien yearly, aged out of cervical cancer screening.  Mammogram 01/2024 -Birads1 through [facility] Harless Lien).  DEXA - h/o osteopenia, latest DEXA 04/2021 WNL - this is also followed by OBGYN.  Lung cancer screening - not eligible  Flu shot - yearly  COVID vaccine Pfizer 11/2019, 12/2019, booster 02/2021, bivalent 06/2021 Tdap 2012, 2022  Prevnar-13 2016, pneumovax 2018  RSV - 07/2023 zostavax -2015  Shingrix - 11/2017, 10/2018  Advanced directive discussion - continues working on this. Husband would be HCPOA then daughter Pattie Borders. Full code, but wouldn't want prolonged life support if terminal condition. Packet previously provided.  Seat belt use discussed.  Sunscreen use discussed, no changing moles on skin - sees derm yearly Dr [place].  Nonsmoker  Alcohol - rare  Dentist q6 mo  Eye exam q6 mo ([facility]) (dry eyes on restasis and Hydro-eyes supplement)  Bowel - no constipation  Bladder - mild stress incontinence   Lives with husband Cynthia Roberson  Occ: retired, was Financial controller  Activity: stays active caring for grandchildren - hasn't used treadmill recently  Diet: good water, fruits/vegetables daily      Relevant past medical, surgical, family and social history reviewed and updated as indicated. Interim medical history since our last visit reviewed. Allergies and medications reviewed and updated. Outpatient Medications Prior to Visit  Medication Sig Dispense Refill   Azelastine HCl (ASTEPRO NA) Place 2 sprays into the nose daily as needed.     Cholecalciferol (VITAMIN D -3) 125 MCG (5000 UT) TABS Take 1 tablet by mouth daily.     COLLAGEN PO Take by mouth.     Cyanocobalamin (VITAMIN B12 PO)  Take by mouth daily.     cycloSPORINE (RESTASIS) 0.05 % ophthalmic emulsion Place 1 drop into both eyes 2 (two) times daily.     Famotidine  (PEPCID  PO) Take by mouth as needed. OTC     lactase (LACTAID) 3000 units tablet Take by mouth 3 (three) times daily with meals. As needed     loratadine (CLARITIN) 10 MG tablet Take 10 mg by mouth daily as needed.      Menaquinone-7 (VITAMIN K2 PO) Take by mouth daily.     MISC NATURAL PRODUCTS PO Take by mouth daily. Myo-Insositol     MISC NATURAL PRODUCTS PO Take by mouth daily. Androgen Blocker Plus     NONFORMULARY OR COMPOUNDED ITEM 2 capsules 2 (two) times daily. Hydro-eye     Saw Palmetto, Serenoa repens, (SAW PALMETTO PO) Take by mouth daily.     Selenium 200 MCG CAPS Take by mouth.     SYNTHROID  50 MCG tablet TAKE 1 TABLET BY MOUTH EVERY MORNING BEFORE BREAKFAST 90 tablet 3   Zinc 50 MG TABS Take 1 tablet by mouth daily.     nebivolol  (BYSTOLIC ) 2.5 MG tablet Take 1 tablet (2.5 mg total) by mouth daily. 90 tablet 4   b complex vitamins capsule Take 1 capsule by mouth daily.     No facility-administered medications prior to visit.     Per HPI unless specifically indicated in ROS section below Review of Systems  Constitutional:  Negative for activity change, appetite change, chills, fatigue, fever and unexpected weight change.  HENT:  Negative for hearing loss.   Eyes:  Negative for visual disturbance.  Respiratory:  Positive for cough (allergies). Negative for chest tightness, shortness of breath and wheezing.   Cardiovascular:  Negative for chest pain, palpitations and leg swelling.  Gastrointestinal:  Negative for abdominal distention, abdominal pain, blood in stool, constipation, diarrhea, nausea and vomiting.  Genitourinary:  Negative for difficulty urinating and hematuria.  Musculoskeletal:  Negative for arthralgias, myalgias and neck pain.  Skin:  Negative for rash.  Neurological:  Negative for dizziness, seizures, syncope and  headaches.  Hematological:  Negative for adenopathy. Does not bruise/bleed easily.  Psychiatric/Behavioral:  Negative for dysphoric mood. The patient is not nervous/anxious.     Objective:  BP 124/82   Pulse 75   Temp 98.6 F (37 C) (Oral)   Ht 5' 1.5" (1.562 m)   Wt 211 lb 6 oz (95.9 kg)   SpO2 96%   BMI 39.29 kg/m   Wt Readings from Last 3 Encounters:  02/27/24 211 lb 6 oz (95.9 kg)  02/04/24 211 lb (95.7 kg)  01/09/24 211 lb 4 oz (95.8 kg)      Physical Exam Vitals and nursing note reviewed.  Constitutional:      Appearance: Normal appearance. She is not ill-appearing.  HENT:     Head: Normocephalic and atraumatic.     Right Ear: Tympanic membrane, ear canal and external ear normal. There is no impacted cerumen.     Left Ear: Tympanic membrane, ear canal and external ear normal. There is no impacted cerumen.     Mouth/Throat:     Mouth: Mucous membranes are moist.     Pharynx: Oropharynx is clear. No oropharyngeal exudate or posterior oropharyngeal erythema.  Eyes:     General:        Right eye: No discharge.        Left eye: No discharge.     Extraocular Movements: Extraocular movements intact.     Conjunctiva/sclera: Conjunctivae normal.     Pupils: Pupils are equal, round, and reactive to light.  Neck:     Thyroid : No thyroid  mass or thyromegaly.     Vascular: No carotid bruit.  Cardiovascular:     Rate and Rhythm: Normal rate and regular rhythm.     Pulses: Normal pulses.     Heart sounds: Normal heart sounds. No murmur heard. Pulmonary:     Effort: Pulmonary effort is normal. No respiratory distress.     Breath sounds: Normal breath sounds. No wheezing, rhonchi or rales.  Abdominal:     General: Bowel sounds are normal. There is no distension.     Palpations: Abdomen is soft. There is no mass.     Tenderness: There is no abdominal tenderness. There is no guarding or rebound.     Hernia: No hernia is present.  Musculoskeletal:     Cervical back: Normal  range of motion and neck supple. No rigidity.     Right lower leg: No edema.     Left lower leg: No edema.  Lymphadenopathy:     Cervical: No cervical adenopathy.  Skin:    General: Skin is warm and dry.     Findings: No rash.  Neurological:     General: No focal deficit present.     Mental Status: She is alert. Mental status is at baseline.  Psychiatric:        Mood and Affect: Mood normal.        Behavior: Behavior normal.       Results for orders placed or performed in visit on 02/20/24  VITAMIN D  25 Hydroxy (Vit-D Deficiency, Fractures)   Collection Time: 02/20/24  9:18 AM  Result Value Ref Range   VITD 48.20 30.00 - 100.00 ng/mL  CBC with Differential/Platelet   Collection Time: 02/20/24  9:18 AM  Result Value Ref Range   WBC 5.9 4.0 - 10.5 K/uL   RBC 6.02 (H) 3.87 - 5.11 Mil/uL   Hemoglobin 15.8 (H) 12.0 - 15.0 g/dL   HCT 16.1 (H) 09.6 - 04.5 %   MCV 80.5 78.0 - 100.0 fl   MCHC 32.6 30.0 - 36.0 g/dL   RDW 40.9 81.1 - 91.4 %   Platelets 220.0 150.0 - 400.0 K/uL   Neutrophils Relative % 52.6 43.0 - 77.0 %   Lymphocytes Relative 32.1 12.0 - 46.0 %   Monocytes Relative 9.2 3.0 - 12.0 %   Eosinophils Relative 5.7 (H) 0.0 - 5.0 %   Basophils Relative 0.4 0.0 - 3.0 %   Neutro Abs 3.1 1.4 - 7.7 K/uL   Lymphs Abs 1.9 0.7 - 4.0 K/uL   Monocytes Absolute 0.5 0.1 - 1.0 K/uL   Eosinophils Absolute 0.3 0.0 - 0.7 K/uL   Basophils Absolute 0.0 0.0 - 0.1 K/uL  Hemoglobin A1c   Collection Time: 02/20/24  9:18 AM  Result Value Ref Range   Hgb A1c MFr Bld 6.3 4.6 - 6.5 %  TSH   Collection Time: 02/20/24  9:18 AM  Result Value Ref Range   TSH 2.44 0.35 - 5.50 uIU/mL  Comprehensive metabolic panel with GFR   Collection Time: 02/20/24  9:18 AM  Result Value Ref Range   Sodium 139 135 - 145 mEq/L   Potassium 4.7 3.5 - 5.1 mEq/L   Chloride 102 96 - 112 mEq/L   CO2 30 19 - 32 mEq/L   Glucose, Bld 110 (H) 70 - 99 mg/dL   BUN 15 6 - 23 mg/dL   Creatinine, Ser 6.04 0.40 - 1.20  mg/dL   Total Bilirubin 0.5 0.2 - 1.2 mg/dL   Alkaline Phosphatase 67 39 - 117 U/L   AST 21 0 - 37 U/L   ALT 29 0 - 35 U/L   Total Protein 6.6 6.0 - 8.3 g/dL   Albumin 4.3 3.5 - 5.2 g/dL   GFR 54.09 (L) >81.19 mL/min   Calcium 9.4 8.4 - 10.5 mg/dL  Lipid panel   Collection Time: 02/20/24  9:18 AM  Result Value Ref Range   Cholesterol 186 0 - 200 mg/dL   Triglycerides 147.8 0.0 - 149.0 mg/dL   HDL 29.56 >21.30 mg/dL   VLDL 86.5 0.0 - 78.4 mg/dL   LDL Cholesterol 696 (H) 0 - 99 mg/dL   Total CHOL/HDL Ratio 4    NonHDL 135.54     Assessment & Plan:   Problem List Items Addressed This Visit     Routine general medical examination at a health care facility - Primary (Chronic)   Preventative protocols reviewed and updated unless pt declined. Discussed healthy diet and lifestyle.       Advanced care planning/counseling discussion (Chronic)   Advanced directive discussion - continues working on this. Husband would be HCPOA then daughter Pattie Borders. Full code, but wouldn't want prolonged life support if terminal condition. Packet previously provided.       PCOS (polycystic ovarian syndrome)   Sees endo       Hypothyroidism   Appreciate endo care on levothyroxine        Relevant Medications   nebivolol  (BYSTOLIC ) 2.5 MG tablet   Hyperandrogenemia   Appreciate endo care Continues saw palmetto      Essential hypertension, benign   Chronic, stable on current regimen.       Relevant Medications   nebivolol  (BYSTOLIC ) 2.5 MG tablet   Osteopenia   DEXA WNL on last check 2022 - followed by GYN      Prediabetes   Continue limiting added sugar in diet.       Dyslipidemia   Chronic, stable off medication. Continue to monitor. The 10-year ASCVD risk score (Arnett DK, et al., 2019) is: 16.1%   Values used to calculate the score:     Age: 46 years     Sex: Female     Is Non-Hispanic African American: No     Diabetic: No     Tobacco smoker: No     Systolic Blood Pressure:  124 mmHg     Is BP treated: Yes     HDL Cholesterol: 50.9 mg/dL     Total Cholesterol: 186 mg/dL      Polycythemia, secondary   CBC stable, thought secondary to PCOS - continues aspirin  81mg  daily  Last hematology evaluation was 2019 Marton Sleeper). Will continue to monitor.  Recheck  labs in 3-6 months (CBC).       Relevant Orders   CBC with Differential/Platelet   Vitamin B12   Severe obesity (BMI 35.0-39.9) with comorbidity (HCC)   Continue to encourage healthy diet and lifestyle choices to affect sustainable weight loss.  Obesity complicated by comorbidities of PCOS, prediabetes, HLD, HTN, CKD.       Vitamin D  deficiency   Continue vit d 5000 units daily.       Carpal tunnel syndrome of left wrist   Describes typical CTS symptoms. Also has possible symptoms of cervical radiculopathy.  Discussed night time cock up wrist brace use.  To let me know if desires further eval -  NCS vs hand surgery eval.       CKD stage 3a, GFR 45-59 ml/min (HCC)   Will continue to monitor. Not microalbuminuric.  Encouraged good hydration status, limiting NSAIDs        Meds ordered this encounter  Medications   nebivolol  (BYSTOLIC ) 2.5 MG tablet    Sig: Take 1 tablet (2.5 mg total) by mouth daily.    Dispense:  90 tablet    Refill:  4   Magnesium 250 MG CAPS    Sig: Take 1 capsule by mouth at bedtime.    Orders Placed This Encounter  Procedures   CBC with Differential/Platelet    Standing Status:   Future    Expiration Date:   02/27/2025   Vitamin B12    Standing Status:   Future    Expiration Date:   02/27/2025    Patient Instructions  Work on setting up advanced directive, bring me copy when complete.  Start using wrist brace(cock up wrist brace or thumb spica brace). Let us  know if worsening to order nerve conduction study.  Good to see you today  Return as needed or in 1 year for next physical/wellness visit.  Schedule lab visit in 3 months  Call back and ask to speak with  practice manager Rice Chamorro or Amy) about recent experience.   Follow up plan: Return in about 1 year (around 02/26/2025) for annual exam, prior fasting for blood work, medicare wellness visit.  Claire Crick, MD

## 2024-02-27 NOTE — Assessment & Plan Note (Signed)
 Advanced directive discussion - continues working on this. Husband would be HCPOA then daughter Pattie Borders. Full code, but wouldn't want prolonged life support if terminal condition. Packet previously provided.

## 2024-02-27 NOTE — Assessment & Plan Note (Signed)
 DEXA WNL on last check 2022 - followed by GYN

## 2024-02-27 NOTE — Patient Instructions (Addendum)
 Work on setting up advanced directive, bring me copy when complete.  Start using wrist brace(cock up wrist brace or thumb spica brace). Let us  know if worsening to order nerve conduction study.  Good to see you today  Return as needed or in 1 year for next physical/wellness visit.  Schedule lab visit in 3 months  Call back and ask to speak with practice manager Rice Chamorro or Amy) about recent experience.

## 2024-02-27 NOTE — Assessment & Plan Note (Signed)
 Appreciate endo care Continues saw palmetto

## 2024-02-27 NOTE — Assessment & Plan Note (Signed)
 CBC stable, thought secondary to PCOS - continues aspirin  81mg  daily  Last hematology evaluation was 2019 Marton Sleeper). Will continue to monitor.  Recheck labs in 3-6 months (CBC).

## 2024-02-27 NOTE — Assessment & Plan Note (Addendum)
 Chronic, stable off medication. Continue to monitor. The 10-year ASCVD risk score (Arnett DK, et al., 2019) is: 16.1%   Values used to calculate the score:     Age: 73 years     Sex: Female     Is Non-Hispanic African American: No     Diabetic: No     Tobacco smoker: No     Systolic Blood Pressure: 124 mmHg     Is BP treated: Yes     HDL Cholesterol: 50.9 mg/dL     Total Cholesterol: 186 mg/dL

## 2024-02-27 NOTE — Assessment & Plan Note (Signed)
 Appreciate endo care on levothyroxine 

## 2024-02-27 NOTE — Assessment & Plan Note (Signed)
 Preventative protocols reviewed and updated unless pt declined. Discussed healthy diet and lifestyle.

## 2024-02-28 DIAGNOSIS — N1831 Chronic kidney disease, stage 3a: Secondary | ICD-10-CM | POA: Insufficient documentation

## 2024-02-28 NOTE — Assessment & Plan Note (Addendum)
 Describes typical CTS symptoms. Also has possible symptoms of cervical radiculopathy.  Discussed night time cock up wrist brace use.  To let me know if desires further eval -  NCS vs hand surgery eval.

## 2024-02-28 NOTE — Assessment & Plan Note (Signed)
 Continue vit d 5000 units daily.

## 2024-02-28 NOTE — Assessment & Plan Note (Signed)
 Sees endo

## 2024-02-28 NOTE — Assessment & Plan Note (Signed)
 Will continue to monitor. Not microalbuminuric.  Encouraged good hydration status, limiting NSAIDs

## 2024-02-28 NOTE — Assessment & Plan Note (Addendum)
 Continue to encourage healthy diet and lifestyle choices to affect sustainable weight loss.  Obesity complicated by comorbidities of PCOS, prediabetes, HLD, HTN, CKD.

## 2024-02-28 NOTE — Assessment & Plan Note (Signed)
 Continue limiting added sugar in diet.

## 2024-03-05 DIAGNOSIS — H02889 Meibomian gland dysfunction of unspecified eye, unspecified eyelid: Secondary | ICD-10-CM | POA: Diagnosis not present

## 2024-03-05 DIAGNOSIS — H43823 Vitreomacular adhesion, bilateral: Secondary | ICD-10-CM | POA: Diagnosis not present

## 2024-03-05 DIAGNOSIS — H5213 Myopia, bilateral: Secondary | ICD-10-CM | POA: Diagnosis not present

## 2024-03-05 DIAGNOSIS — H04123 Dry eye syndrome of bilateral lacrimal glands: Secondary | ICD-10-CM | POA: Diagnosis not present

## 2024-03-05 DIAGNOSIS — H524 Presbyopia: Secondary | ICD-10-CM | POA: Diagnosis not present

## 2024-04-17 ENCOUNTER — Other Ambulatory Visit: Payer: Self-pay | Admitting: Family Medicine

## 2024-04-17 DIAGNOSIS — I10 Essential (primary) hypertension: Secondary | ICD-10-CM

## 2024-04-19 NOTE — Telephone Encounter (Signed)
 Too soon. Rx sent 02/27/24, #90/4 refills to [facility]  Request denied.

## 2024-07-28 DIAGNOSIS — D239 Other benign neoplasm of skin, unspecified: Secondary | ICD-10-CM

## 2024-07-28 HISTORY — DX: Other benign neoplasm of skin, unspecified: D23.9

## 2024-08-04 DIAGNOSIS — L819 Disorder of pigmentation, unspecified: Secondary | ICD-10-CM | POA: Diagnosis not present

## 2024-08-04 DIAGNOSIS — D1801 Hemangioma of skin and subcutaneous tissue: Secondary | ICD-10-CM | POA: Diagnosis not present

## 2024-08-04 DIAGNOSIS — D225 Melanocytic nevi of trunk: Secondary | ICD-10-CM | POA: Diagnosis not present

## 2024-08-04 DIAGNOSIS — L821 Other seborrheic keratosis: Secondary | ICD-10-CM | POA: Diagnosis not present

## 2024-08-04 DIAGNOSIS — D485 Neoplasm of uncertain behavior of skin: Secondary | ICD-10-CM | POA: Diagnosis not present

## 2024-08-11 ENCOUNTER — Encounter: Payer: Self-pay | Admitting: Family Medicine

## 2024-09-04 ENCOUNTER — Other Ambulatory Visit: Payer: Self-pay | Admitting: Internal Medicine

## 2024-09-04 DIAGNOSIS — E039 Hypothyroidism, unspecified: Secondary | ICD-10-CM

## 2024-09-17 ENCOUNTER — Ambulatory Visit: Payer: Medicare PPO | Admitting: Internal Medicine

## 2024-10-04 NOTE — Progress Notes (Addendum)
 Patient ID: Cynthia Roberson, female   DOB: August 14, 1951, 73 y.o.   MRN: [id number]  HPI  Cynthia Roberson is a 73 y.o.-year-old female, returning for post menopausal hyperandrogenism, hypothyroidism, prediabetes. Last visit 1 year ago.  Interim history: She has ankle pain, also back pain.  She is not able to to walk for exercise.  She is planning to start an exercise routine. Before last visit, PCP suggested the weight management clinic.  However, she wanted to try dieting by herself.  She gained 4 pounds since last visit.  She is reticent to start a GLP-1 receptor agonist due to the risk of palpitations. She previously used rice water spray on forehead-felt that this was helping.  She also tried saw palmetto and spearmint tea - now off.  At last visit she was using Rogaine twice a day. She tried Spironolactone  since last OV >> developed palpitations and had to stop. She started myo-inositol 4 capsules daily. She tried Metformin  but she could not tolerate it.  She is preparing to start a supplement with berberine, chromium, alpha lipoic acid, cinnamon, and bitter melon.  Reviewed history: Patient had a long history of hyperandrogenism (high testosterone  level), and has a history of PCOS + infertility. She could not tolerate OCPs in the past (migraines).   The patient has a history of scalp hair thinning since her younger years, for which she has been using prescription strength, Rogaine with good results. She continues on minoxidil for scalp hair loss.  Also, saw palmetto, and rice water spray.  No acne.  She is postmenopausal since ~ age 86. She had a always had a history of irregular menses and had been on birth control in the past. She had difficulty with conception and had used Clomid. She has had 2 successful pregnancies in the past. Following this, the patient had regular menses for some time until she attained menopause.  No signs of masculinization.  Testosterone  level - elevated at 130 in  2013.  Subsequent levels were still high, however, all of the levels below were drawn while on spironolactone . We stopped checking her testosterone  levels after 03/2015, when she was on spironolactone  Component     Latest Ref Rng 07/06/2014 07/14/2014 10/11/2014 04/26/2015  Testosterone      3 - 41 ng/dL 826 (H)  97 (H)   Sex Hormone Binding     18 - 114 nmol/L 31     Testosterone  Free     0.0 - 4.2 pg/mL 33.8 (H)  6.1 (H)   Testosterone -% Free     0.4 - 2.4 % 2.0     FSH      26.1     LH      20.3     DHEA-SO4     29.4 - 220.5 ug/dL  22.9    Testosterone , total     7.0 - 40.0 ng/dL    877.2 (H)   Reviewed previous work-up: - Ruled out for Cushing's syndrome based on 1 mg dex suppression test- morning cortisol is 1.47 (05/2013) - DHEAS 44.3, Prolactin 5.9 (05/2013) - CT Abdomen and pelvis with contrast (04/2013) - No adrenal mass or any ovarian abnormality - Ovarian US  was negative (06/2014) At last visit with Dr.Phadke, the use of flutamide was discussed, as well as the possibility of oophorectomy.  She is also seeing Dr Gorge [facility] and Dr Lomax (derm).  She has been on spironolactone  since 05/2013 with mild elevation in her potassium levels so the dose of spironolactone  was maintained  low, 25 mg daily. In 2018 we decided to stop spironolactone . She felt better afterwards. She noticed significantly less hair on chin especially after she started waxing every 3 weeks.   At our visit in 08/2021, she felt that her hirsutism worsened. We restarted spironolactone  - 25 mg twice a day.  She stopped recently 2/2 feeling poorly, especially after she increased the dose to twice a day.   In 08/2023, she was interested to restart her low-dose spironolactone , 25 mg daily.  We started this but she developed palpitations and had to stop again.  Testosterone  levels reviewed: Component     Latest Ref Rng 09/23/2023  Testosterone  Free     0.3 - 5.0 pg/mL 20.2 (H)     Component     Latest Ref  Rng 09/02/2022  Testosterone , Serum (Total)     ng/dL 874 (H)   % Free Testosterone      % 1.3   Free Testosterone , S     pg/mL 16 (H)   Sex Hormone Binding Globulin     nmol/L 39.4    Component     Latest Ref Rng & Units 09/07/2021  Testosterone , Serum (Total)     ng/dL 824 (H)  % Free Testosterone      % 0.8  Free Testosterone , S     pg/mL 14 (H)  Sex Hormone Binding Globulin     nmol/L 55.8   Component     Latest Ref Rng & Units 09/04/2020  Testosterone , Serum (Total)     ng/dL 890 (H)  % Free Testosterone      % 0.9  Free Testosterone , S     pg/mL 9.8 (H)  Sex Hormone Binding Globulin     nmol/L 43.2  Hemoglobin A1C     4.6 - 6.5 % 6.1  TSH     0.35 - 4.50 uIU/mL 2.30  T4,Free(Direct)     0.60 - 1.60 ng/dL 8.90  DHEA-Sulfate, LCMS     Ug/dL Reference Range:  Adult Females (61 - 70y): <128  68   Prev.: Component     Latest Ref Rng & Units 07/24/2018 09/01/2019  Testosterone , Serum (Total)     ng/dL 92 (H) 878 (H)  % Free Testosterone      % 1.2 1.2  Free Testosterone , S     pg/mL 11 (H) 15 (H)  Sex Hormone Binding Globulin     nmol/L 50.3 46.5   On spironolactone : Component     Latest Ref Rng & Units 07/24/2017  Testosterone      3 - 41 ng/dL 82 (H)  Testosterone  Free     0.0 - 4.2 pg/mL 2.8  Sex Horm Binding Glob, Serum     17.3 - 125.0 nmol/L 50.8   Reviewed potassium levels: Lab Results  Component Value Date   K 4.7 02/20/2024   K 4.6 02/19/2023   K 4.9 01/31/2022   K 4.6 10/08/2021   K 4.0 09/07/2021   Hypothyroidism, well controlled - dx in 1995 -She continues on Synthroid  d.a.w. 50 mcg daily, stable dose for the last 17 years -We tried to switch to generic levothyroxine  but she could not tolerate it due to palpitations.  Pt takes Synthroid : - in am - fasting - at least 30 min from b'fast - no calcium - no iron - no multivitamins - no PPIs - Stopped biotin (B Complex)  Latest TSH was normal: Lab Results  Component Value Date    TSH 2.44 02/20/2024   Prediabetes.  - She did  not tolerate  regular metformin  in the past >> Metformin  ER 1000 mg daily with brunch >> had to stop at the end of 2022 due to diarrhea.  Currently off diabetic medications.  She started myo-inositol recently.  HbA1c levels are in the prediabetic range: Lab Results  Component Value Date   HGBA1C 6.3 02/20/2024   HGBA1C 6.3 (H) 09/23/2023   HGBA1C 6.1 02/19/2023   HGBA1C 6.3 09/02/2022   HGBA1C 6.4 09/07/2021   HGBA1C 5.8 (A) 12/11/2020   HGBA1C 6.1 09/04/2020   HGBA1C 5.9 11/17/2019   HGBA1C 5.5 09/01/2019   HGBA1C 6.0 07/24/2018   HGBA1C 5.9 11/07/2017   HGBA1C 5.8 07/24/2017   HGBA1C 5.9 02/21/2017   HGBA1C 5.9 07/25/2016   HGBA1C 6.1 01/23/2016   HGBA1C 6.0 02/09/2015   HGBA1C 6.3 10/11/2014   HGBA1C 6.2 07/06/2014   HGBA1C 5.7 01/11/2014   HGBA1C 5.6 06/10/2013   She started ASA 81 per Dr. Lonn >> bruising, petechiae >> stopped.  Has numbness and tingling in L hand. She has carpal tunnel.  ROS: + see HPI  I reviewed pt's medications, allergies, PMH, social hx, family hx, and changes were documented in the history of present illness. Otherwise, unchanged from my initial visit note.  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 (Dr Amy Jordan)   GERD (gastroesophageal reflux disease)    Hirsutism    Hyperandrogenemia    Hyperlipidemia    Hypertension    Osteopenia    DEXA 2016   Other alopecia    Polycystic ovaries    Tubular adenoma of colon 05/25/2015   Unspecified hypothyroidism    Past Surgical History:  Procedure Laterality Date   COLONOSCOPY  04/2015   TA, diverticulosis, rpt 5 yrs ([facility])   COLONOSCOPY  2011   adenomatous polyp, rpt 5 yrs ([facility])   COLONOSCOPY  01/2022   4 TAs,  mild diverticulosis, rpt 3 yrs [facility])   POLYPECTOMY     TONSILLECTOMY     WISDOM TOOTH EXTRACTION     Social History   Social History   Marital Status: Married    Spouse Name: N/A   Number of Children: 2   Occupational History   Retired runner, broadcasting/film/video, now development worker, community   Social History Main Topics   Smoking status: Never Smoker    Smokeless tobacco: Not on file   Alcohol Use:     1-3 Glasses of wine per year   Drug Use: No   Sexual Activity: Yes   Current Outpatient Medications on File Prior to Visit  Medication Sig Dispense Refill   Azelastine HCl (ASTEPRO NA) Place 2 sprays into the nose daily as needed.     Cholecalciferol (VITAMIN D -3) 125 MCG (5000 UT) TABS Take 1 tablet by mouth daily.     COLLAGEN PO Take by mouth.     Cyanocobalamin (VITAMIN B12 PO) Take by mouth daily.     cycloSPORINE (RESTASIS) 0.05 % ophthalmic emulsion Place 1 drop into both eyes 2 (two) times daily.     Famotidine  (PEPCID  PO) Take by mouth as needed. OTC     lactase (LACTAID) 3000 units tablet Take by mouth 3 (three) times daily with meals. As needed     loratadine (CLARITIN) 10 MG tablet Take 10 mg by mouth  daily as needed.      Magnesium  250 MG CAPS Take 1 capsule by mouth at bedtime.     Menaquinone-7 (VITAMIN K2 PO) Take by mouth daily.     MISC NATURAL PRODUCTS PO Take by mouth daily. Myo-Insositol     MISC NATURAL PRODUCTS PO Take by mouth daily. Androgen Blocker Plus     nebivolol  (BYSTOLIC ) 2.5 MG tablet Take 1 tablet (2.5 mg total) by mouth daily. 90 tablet 4   NONFORMULARY OR COMPOUNDED ITEM 2 capsules 2 (two) times daily. Hydro-eye     Saw Palmetto, Serenoa repens, (SAW PALMETTO PO) Take by mouth daily.     Selenium 200 MCG CAPS Take by mouth.     SYNTHROID  50 MCG tablet TAKE 1 TABLET BY MOUTH EVERY MORNING BEFORE BREAKFAST 90 tablet 0   Zinc 50 MG TABS Take 1 tablet by mouth daily.     No current facility-administered medications on file prior to visit.   Allergies   Allergen Reactions   Spironolactone  Other (See Comments)    Palpitations, elevated potassium   Amoxicillin Rash   Levothyroxine  Palpitations    With generic levothyroxine , tolerates well the brand name.   Statins Palpitations    Tried Crestor, Lipitor and others   Sulfa Antibiotics Rash   Family History  Problem Relation Age of Onset   Heart failure Mother    Cancer Mother 2       lung   Asthma Mother    Miscarriages / Stillbirths Mother    Obesity Mother    Heart disease Father    Stroke Father    Hypertension Father    Alcohol abuse Father    Rectal cancer Paternal Grandfather    Cancer Paternal Grandfather    Cancer Maternal Grandmother    Alcohol abuse Paternal Aunt    Miscarriages / Stillbirths Paternal Aunt    Alcohol abuse Paternal Aunt    Arthritis Paternal Aunt    Cancer Paternal Aunt    Alcohol abuse Paternal Uncle    Cancer Paternal Aunt    Obesity Paternal Aunt    Stomach cancer Neg Hx    Colon cancer Neg Hx    PE: BP 120/78   Pulse 76   Ht 5' 1.5 (1.562 m)   Wt 211 lb 9.6 oz (96 kg)   SpO2 96%   BMI 39.33 kg/m  Wt Readings from Last 20 Encounters:  10/05/24 211 lb 9.6 oz (96 kg)  02/27/24 211 lb 6 oz (95.9 kg)  02/04/24 211 lb (95.7 kg)  01/09/24 211 lb 4 oz (95.8 kg)  09/23/23 207 lb 9.6 oz (94.2 kg)  02/26/23 208 lb (94.3 kg)  02/03/23 204 lb (92.5 kg)  09/02/22 202 lb 3.2 oz (91.7 kg)  08/14/22 202 lb 4 oz (91.7 kg)  02/12/22 195 lb (88.5 kg)  02/11/22 200 lb (90.7 kg)  01/30/22 195 lb (88.5 kg)  12/20/21 195 lb 4 oz (88.6 kg)  09/07/21 196 lb 9.6 oz (89.2 kg)  08/31/21 198 lb 6 oz (90 kg)  04/13/21 192 lb 3 oz (87.2 kg)  12/11/20 188 lb 2 oz (85.3 kg)  08/31/20 184 lb 9.6 oz (83.7 kg)  11/22/19 181 lb 3 oz (82.2 kg)  11/17/19 182 lb (82.6 kg)   Constitutional: overweight, in NAD Eyes: EOMI, no exophthalmos ENT: no thyromegaly, no cervical lymphadenopathy Cardiovascular: RRR, No MRG Respiratory: CTA B Musculoskeletal: no  deformities  Skin: + female pattern baldness, hirsutism on chin Neurological: no tremor with outstretched  hands  ASSESSMENT: 1. Postmenopausal hyperandrogenism - She discussed with Dr. Jana and Dr. Gorge before about the possibility of performing oophorectomy to look for ovarian hyperthecosis or small hilar ovarian tumors. She refused. We are following her clinically and biochemically.  2. Hypothyroidism  3. Prediabetes  PLAN:  1. Postmenopausal hyperandrogenism -likely due to pre-existing PCOS Patient with history of hyperandrogenism due most likely to previously undiagnosed PCOS or possibly secondary to insulin  resistance or, less likely, adrenal, ovarian tumors or ovarian hyperthecosis -  these occurred with usually higher levels of testosterone , higher than 150 ng/mL.  Patient had repeated CT imaging of her adrenals, and also transvaginal ultrasound for evaluation of her ovaries and these tests were all negative for any masses or hyperplasia.  Her hormonal tests were also negative for hyperprolactinemia, pituitary dysfunction, Cushing syndrome. -We initially started her on spironolactone  but then stopped to see if she absolutely needed it.  She felt better after stopping it but testosterone  increased.  She restarted spironolactone  afterwards but then came off again 25 mg twice a day.  At last visit she was interested to restart a lower dose, 25 mg daily.  Since her blood pressure was also elevated, I agreed with starting it.  She tried to start it after last visit but had to come off if she again was not able to tolerate it.  Since we already tried the medication 3 times and she was not able to tolerate it, I strongly recommended to stay off - She tried different remedies for female pattern baldness: Spearmint tea, saw palmetto, rice water spray, Androgen blocker plus, Rogaine twice a day.  She recently used the androgen blocker plus, but she is now off it again.   - We did discuss in the  past about proceeding with bilateral oophorectomy and she discussed with Dr. Gorge about it.  It was not felt that oophorectomy would help her. - Latest testosterone  level was reviewed from last visit and this was higher, at 20.2.  Will check another testosterone  level at the next lab draw.  We discussed that myo-inositol can help. -I will see her back in 1 year  2. Hypothyroidism - latest thyroid  labs reviewed with pt. >> normal: Lab Results  Component Value Date   TSH 2.44 02/20/2024  - she continues on Synthroid  d.a.w. 50 mcg daily - pt feels good on this dose. - we discussed about taking the thyroid  hormone every day, with water, >30 minutes before breakfast, separated by >4 hours from acid reflux medications, calcium, iron, multivitamins. Pt. is taking it correctly. - will check thyroid  tests at next lab draw (she plans to return in a week as she does dehydrated now): TSH and fT4 - If labs are abnormal, she will need to return for repeat TFTs in 1.5 months  3. Prediabetes - She was previously on metformin  ER 1000 mg daily but stopped after having diarrhea from it - Latest HbA1c was slightly higher in 01/2024, at 6.3%, but still in the prediabetic range - I previously encouraged her to continue exercising-she has a [employer] at home and she was also planning to improve diet.  At today's visit, she is not exercising but planning to start.  She mentions she is not eating large portions.  She is preparing to start a supplement with berberine, bitter melon, chromium, cinnamon, alpha lipoic acid.  We discussed that all of these have beneficial effect on blood sugars. - Will recheck her HbA1c at next lab draw.  I  advised her to not start the above supplements until after we check her HbA1c.  Needs refills - Synthroid .  Orders Placed This Encounter  Procedures   TSH   T4, free   Hemoglobin A1c   Testosterone  Total,Free,Bio, Males   Component     Latest Ref Rng 10/14/2024  Hemoglobin  A1C     <5.7 % 6.2 (H)   T4,Free(Direct)     0.8 - 1.8 ng/dL 1.1   TSH     9.59 - 5.49 mIU/L 2.64   Mean Plasma Glucose     mg/dL 868   eAG (mmol/L)     mmol/L 7.3   Testosterone , Total, LC-MS-MS     2 - 45 ng/dL 833 (H)   Free Testosterone      0.2 - 3.7 pg/mL 20.1 (H)   Sex Horm Binding Glob, Serum     14 - 73 nmol/L 37   HbA1c slightly lower than before, TFTs are normal.   Both total and free testosterone  levels are quite high.  At this point, I would suggest a repeat ovarian ultrasound.  Latest imaging test was approximately 10 years ago.  This was negative, however, testosterone  producing ovarian tumors may be quite small and situated in the hilum, and not readily observed on imaging.  If the investigation is negative, we may need to image her adrenals, also, and if this is negative, to proceed with BSO.  Lela Fendt, MD PhD [facility]

## 2024-10-05 ENCOUNTER — Encounter: Payer: Self-pay | Admitting: Internal Medicine

## 2024-10-05 ENCOUNTER — Ambulatory Visit: Admitting: Internal Medicine

## 2024-10-05 VITALS — BP 120/78 | HR 76 | Ht 61.5 in | Wt 211.6 lb

## 2024-10-05 DIAGNOSIS — R7303 Prediabetes: Secondary | ICD-10-CM | POA: Diagnosis not present

## 2024-10-05 DIAGNOSIS — E039 Hypothyroidism, unspecified: Secondary | ICD-10-CM | POA: Diagnosis not present

## 2024-10-05 DIAGNOSIS — E281 Androgen excess: Secondary | ICD-10-CM

## 2024-10-05 NOTE — Patient Instructions (Addendum)
Please stop at the lab.  Please continue Synthroid 50 mcg daily.  Take the thyroid hormone every day, with water, at least 30 minutes before breakfast, separated by at least 4 hours from: - acid reflux medications - calcium - iron - multivitamins  Please come back for a follow-up appointment in 1 year. 

## 2024-10-14 ENCOUNTER — Other Ambulatory Visit

## 2024-10-15 ENCOUNTER — Other Ambulatory Visit: Payer: Self-pay | Admitting: Internal Medicine

## 2024-10-15 ENCOUNTER — Ambulatory Visit: Payer: Self-pay | Admitting: Internal Medicine

## 2024-10-15 DIAGNOSIS — E039 Hypothyroidism, unspecified: Secondary | ICD-10-CM

## 2024-10-15 MED ORDER — SYNTHROID 50 MCG PO TABS: ORAL_TABLET | ORAL | 3 refills | Status: AC

## 2024-10-20 LAB — TESTOS,TOTAL,FREE AND SHBG (FEMALE)
Free Testosterone: 20.1 pg/mL — ABNORMAL HIGH (ref 0.2–3.7)
Sex Hormone Binding: 37 nmol/L (ref 14–73)
Testosterone, Total, LC-MS-MS: 166 ng/dL — ABNORMAL HIGH (ref 2–45)

## 2024-10-20 LAB — TSH: TSH: 2.64 m[IU]/L (ref 0.40–4.50)

## 2024-10-20 LAB — HEMOGLOBIN A1C
Hgb A1c MFr Bld: 6.2 % — ABNORMAL HIGH
Mean Plasma Glucose: 131 mg/dL
eAG (mmol/L): 7.3 mmol/L

## 2024-10-20 LAB — T4, FREE: Free T4: 1.1 ng/dL (ref 0.8–1.8)

## 2025-02-07 ENCOUNTER — Ambulatory Visit

## 2025-02-25 ENCOUNTER — Other Ambulatory Visit

## 2025-03-01 ENCOUNTER — Encounter: Admitting: Family Medicine

## 2025-10-05 ENCOUNTER — Ambulatory Visit: Admitting: Internal Medicine

## 7364-01-27 DEATH — deceased
